# Patient Record
Sex: Male | Born: 1965 | Race: White | Hispanic: No | Smoking: Current every day smoker
Health system: Southern US, Community
[De-identification: ages and names within clinical notes are randomized; demographics above are authoritative.]

## PROBLEM LIST (undated history)

## (undated) DIAGNOSIS — I10 Essential (primary) hypertension: Secondary | ICD-10-CM

## (undated) DIAGNOSIS — F200 Paranoid schizophrenia: Secondary | ICD-10-CM

## (undated) DIAGNOSIS — K7031 Alcoholic cirrhosis of liver with ascites: Secondary | ICD-10-CM

## (undated) HISTORY — DX: Essential (primary) hypertension: I10

## (undated) HISTORY — PX: LACERATION REPAIR: SHX5168

---

## 2018-11-17 DIAGNOSIS — I10 Essential (primary) hypertension: Secondary | ICD-10-CM | POA: Insufficient documentation

## 2018-11-17 DIAGNOSIS — F1020 Alcohol dependence, uncomplicated: Secondary | ICD-10-CM | POA: Insufficient documentation

## 2019-01-17 ENCOUNTER — Inpatient Hospital Stay
Admission: EM | Admit: 2019-01-17 | Discharge: 2019-01-23 | DRG: 432 | Disposition: A | Discharge status: Home Health | Payer: Medicaid Other | Attending: Internal Medicine | Admitting: Internal Medicine

## 2019-01-17 ENCOUNTER — Emergency Department: Payer: Medicaid Other

## 2019-01-17 ENCOUNTER — Other Ambulatory Visit: Payer: Self-pay

## 2019-01-17 ENCOUNTER — Inpatient Hospital Stay: Payer: Medicaid Other | Admitting: Certified Registered"

## 2019-01-17 ENCOUNTER — Encounter
Admission: EM | Disposition: A | Discharge status: Home Health | Payer: Self-pay | Source: Home / Self Care | Attending: Internal Medicine

## 2019-01-17 ENCOUNTER — Inpatient Hospital Stay: Payer: Self-pay

## 2019-01-17 DIAGNOSIS — F10239 Alcohol dependence with withdrawal, unspecified: Secondary | ICD-10-CM | POA: Diagnosis present

## 2019-01-17 DIAGNOSIS — K7040 Alcoholic hepatic failure without coma: Secondary | ICD-10-CM | POA: Diagnosis present

## 2019-01-17 DIAGNOSIS — F17210 Nicotine dependence, cigarettes, uncomplicated: Secondary | ICD-10-CM | POA: Diagnosis present

## 2019-01-17 DIAGNOSIS — E871 Hypo-osmolality and hyponatremia: Secondary | ICD-10-CM | POA: Diagnosis present

## 2019-01-17 DIAGNOSIS — E876 Hypokalemia: Secondary | ICD-10-CM | POA: Diagnosis present

## 2019-01-17 DIAGNOSIS — K210 Gastro-esophageal reflux disease with esophagitis: Secondary | ICD-10-CM | POA: Diagnosis present

## 2019-01-17 DIAGNOSIS — Z515 Encounter for palliative care: Secondary | ICD-10-CM

## 2019-01-17 DIAGNOSIS — R0602 Shortness of breath: Secondary | ICD-10-CM

## 2019-01-17 DIAGNOSIS — R188 Other ascites: Secondary | ICD-10-CM

## 2019-01-17 DIAGNOSIS — K7011 Alcoholic hepatitis with ascites: Secondary | ICD-10-CM | POA: Diagnosis present

## 2019-01-17 DIAGNOSIS — K7031 Alcoholic cirrhosis of liver with ascites: Secondary | ICD-10-CM | POA: Diagnosis present

## 2019-01-17 DIAGNOSIS — K3189 Other diseases of stomach and duodenum: Secondary | ICD-10-CM | POA: Diagnosis present

## 2019-01-17 DIAGNOSIS — D689 Coagulation defect, unspecified: Secondary | ICD-10-CM | POA: Diagnosis present

## 2019-01-17 DIAGNOSIS — K920 Hematemesis: Secondary | ICD-10-CM | POA: Diagnosis present

## 2019-01-17 DIAGNOSIS — I8511 Secondary esophageal varices with bleeding: Secondary | ICD-10-CM | POA: Diagnosis present

## 2019-01-17 DIAGNOSIS — Z7189 Other specified counseling: Secondary | ICD-10-CM

## 2019-01-17 DIAGNOSIS — K766 Portal hypertension: Secondary | ICD-10-CM | POA: Diagnosis present

## 2019-01-17 DIAGNOSIS — Z23 Encounter for immunization: Secondary | ICD-10-CM

## 2019-01-17 HISTORY — PX: ESOPHAGOGASTRODUODENOSCOPY (EGD) WITH PROPOFOL: SHX5813

## 2019-01-17 LAB — URINALYSIS, COMPLETE (UACMP) WITH MICROSCOPIC
Bacteria, UA: NONE SEEN
Glucose, UA: NEGATIVE mg/dL
Ketones, ur: NEGATIVE mg/dL
Leukocytes,Ua: NEGATIVE
Nitrite: NEGATIVE
Protein, ur: NEGATIVE mg/dL
Specific Gravity, Urine: 1.014 (ref 1.005–1.030)
pH: 6 (ref 5.0–8.0)

## 2019-01-17 LAB — HEMOGLOBIN AND HEMATOCRIT, BLOOD
HCT: 38.5 % — ABNORMAL LOW (ref 39.0–52.0)
HCT: 38.2 % — ABNORMAL LOW (ref 39.0–52.0)
HCT: 36.9 % — ABNORMAL LOW (ref 39.0–52.0)
Hemoglobin: 13.9 g/dL (ref 13.0–17.0)
Hemoglobin: 13.4 g/dL (ref 13.0–17.0)
Hemoglobin: 13 g/dL (ref 13.0–17.0)

## 2019-01-17 LAB — COMPREHENSIVE METABOLIC PANEL
ALT: 24 U/L (ref 0–44)
AST: 95 U/L — ABNORMAL HIGH (ref 15–41)
Albumin: 2.6 g/dL — ABNORMAL LOW (ref 3.5–5.0)
Alkaline Phosphatase: 153 U/L — ABNORMAL HIGH (ref 38–126)
Anion gap: 8 (ref 5–15)
BUN: 5 mg/dL — ABNORMAL LOW (ref 6–20)
CO2: 24 mmol/L (ref 22–32)
Calcium: 7.8 mg/dL — ABNORMAL LOW (ref 8.9–10.3)
Chloride: 99 mmol/L (ref 98–111)
Creatinine, Ser: 0.5 mg/dL — ABNORMAL LOW (ref 0.61–1.24)
GFR calc Af Amer: >60 mL/min (ref >60)
GFR calc non Af Amer: >60 mL/min (ref >60)
Glucose, Bld: 114 mg/dL — ABNORMAL HIGH (ref 70–99)
Potassium: 3.1 mmol/L — ABNORMAL LOW (ref 3.5–5.1)
Sodium: 131 mmol/L — ABNORMAL LOW (ref 135–145)
Total Bilirubin: 5.8 mg/dL — ABNORMAL HIGH (ref 0.3–1.2)
Total Protein: 8.1 g/dL (ref 6.5–8.1)

## 2019-01-17 LAB — CBC
HCT: 39.7 % (ref 39.0–52.0)
Hemoglobin: 14.1 g/dL (ref 13.0–17.0)
MCH: 34.9 pg — ABNORMAL HIGH (ref 26.0–34.0)
MCHC: 35.5 g/dL (ref 30.0–36.0)
MCV: 98.3 fL (ref 80.0–100.0)
Platelets: 57 10*3/uL — ABNORMAL LOW (ref 150–400)
RBC: 4.04 MIL/uL — ABNORMAL LOW (ref 4.22–5.81)
RDW: 15.1 % (ref 11.5–15.5)
WBC: 7.9 10*3/uL (ref 4.0–10.5)
nRBC: 0 % (ref 0.0–0.2)

## 2019-01-17 LAB — TYPE AND SCREEN
ABO/RH(D): A POS
Antibody Screen: NEGATIVE

## 2019-01-17 LAB — AMMONIA: Ammonia: 56 umol/L — ABNORMAL HIGH (ref 9–35)

## 2019-01-17 LAB — PROTIME-INR
INR: 1.6 — ABNORMAL HIGH (ref 0.8–1.2)
Prothrombin Time: 18.7 seconds — ABNORMAL HIGH (ref 11.4–15.2)

## 2019-01-17 LAB — LIPASE, BLOOD: Lipase: 57 U/L — ABNORMAL HIGH (ref 11–51)

## 2019-01-17 SURGERY — ESOPHAGOGASTRODUODENOSCOPY (EGD) WITH PROPOFOL
Anesthesia: General

## 2019-01-17 MED ORDER — NICOTINE 14 MG/24HR TD PT24
14.0000 mg | MEDICATED_PATCH | Freq: Every day | TRANSDERMAL | Status: DC
  Administered 2019-01-17 – 2019-01-18 (×2): 14 mg via TRANSDERMAL
  Filled 2019-01-17 (×2): qty 1

## 2019-01-17 MED ORDER — PROPOFOL 10 MG/ML IV BOLUS
INTRAVENOUS | Status: DC | PRN
  Administered 2019-01-17: 70 mg via INTRAVENOUS

## 2019-01-17 MED ORDER — LORAZEPAM 2 MG PO TABS: 0.0000 mg | ORAL_TABLET | Freq: Two times a day (BID) | ORAL | Status: DC

## 2019-01-17 MED ORDER — NADOLOL 20 MG PO TABS
20.0000 mg | ORAL_TABLET | Freq: Every day | ORAL | Status: DC
  Administered 2019-01-17 – 2019-01-23 (×6): 20 mg via ORAL
  Filled 2019-01-17 (×7): qty 1

## 2019-01-17 MED ORDER — SODIUM CHLORIDE 0.9 % IV SOLN
50.0000 ug/h | INTRAVENOUS | Status: DC
  Administered 2019-01-17 – 2019-01-21 (×8): 50 ug/h via INTRAVENOUS
  Filled 2019-01-17 (×13): qty 1

## 2019-01-17 MED ORDER — ONDANSETRON HCL 4 MG PO TABS: 4.0000 mg | ORAL_TABLET | Freq: Four times a day (QID) | ORAL | Status: DC | PRN

## 2019-01-17 MED ORDER — SODIUM CHLORIDE 0.9 % IV SOLN
1.0000 g | INTRAVENOUS | Status: DC
  Administered 2019-01-18: 1 g via INTRAVENOUS
  Filled 2019-01-17: qty 10
  Filled 2019-01-17: qty 1

## 2019-01-17 MED ORDER — LIDOCAINE 2% (20 MG/ML) 5 ML SYRINGE
INTRAMUSCULAR | Status: DC | PRN
  Administered 2019-01-17: 25 mg via INTRAVENOUS

## 2019-01-17 MED ORDER — PROPOFOL 500 MG/50ML IV EMUL
INTRAVENOUS | Status: DC | PRN
  Administered 2019-01-17: 100 ug/kg/min via INTRAVENOUS

## 2019-01-17 MED ORDER — LORAZEPAM 2 MG/ML IJ SOLN: 0.0000 mg | Freq: Two times a day (BID) | INTRAMUSCULAR | Status: DC

## 2019-01-17 MED ORDER — THIAMINE HCL 100 MG/ML IJ SOLN
100.0000 mg | Freq: Every day | INTRAMUSCULAR | Status: DC
  Administered 2019-01-17: 100 mg via INTRAVENOUS
  Filled 2019-01-17: qty 2

## 2019-01-17 MED ORDER — ACETAMINOPHEN 650 MG RE SUPP: 650.0000 mg | Freq: Four times a day (QID) | RECTAL | Status: DC | PRN

## 2019-01-17 MED ORDER — LORAZEPAM 2 MG PO TABS
0.0000 mg | ORAL_TABLET | Freq: Four times a day (QID) | ORAL | Status: DC
  Administered 2019-01-18: 2 mg via ORAL
  Administered 2019-01-18: 1 mg via ORAL
  Filled 2019-01-17 (×2): qty 1

## 2019-01-17 MED ORDER — ONDANSETRON HCL 4 MG/2ML IJ SOLN
4.0000 mg | Freq: Once | INTRAMUSCULAR | Status: AC
  Administered 2019-01-17: 07:00:00 4 mg via INTRAVENOUS
  Filled 2019-01-17: qty 2

## 2019-01-17 MED ORDER — SODIUM CHLORIDE 0.9 % IV SOLN
1.0000 g | Freq: Once | INTRAVENOUS | Status: AC
  Administered 2019-01-17: 07:00:00 1 g via INTRAVENOUS
  Filled 2019-01-17: qty 10

## 2019-01-17 MED ORDER — LORAZEPAM 2 MG/ML IJ SOLN
0.0000 mg | Freq: Four times a day (QID) | INTRAMUSCULAR | Status: DC
  Administered 2019-01-17: 16:00:00 2 mg via INTRAVENOUS
  Filled 2019-01-17: qty 1

## 2019-01-17 MED ORDER — THIAMINE HCL 100 MG/ML IJ SOLN
Freq: Once | INTRAVENOUS | Status: AC
  Administered 2019-01-17: 23:00:00 via INTRAVENOUS
  Filled 2019-01-17: qty 1000

## 2019-01-17 MED ORDER — VITAMIN B-1 100 MG PO TABS
100.0000 mg | ORAL_TABLET | Freq: Every day | ORAL | Status: DC
  Administered 2019-01-18: 100 mg via ORAL
  Filled 2019-01-17: qty 1

## 2019-01-17 MED ORDER — SODIUM CHLORIDE 0.9 % IV SOLN
INTRAVENOUS | Status: DC | PRN
  Administered 2019-01-17: 17:00:00 via INTRAVENOUS

## 2019-01-17 MED ORDER — SODIUM CHLORIDE 0.9 % IV SOLN
80.0000 mg | Freq: Once | INTRAVENOUS | Status: AC
  Administered 2019-01-17: 80 mg via INTRAVENOUS
  Filled 2019-01-17: qty 40

## 2019-01-17 MED ORDER — DIPHENHYDRAMINE HCL 50 MG/ML IJ SOLN
12.5000 mg | Freq: Once | INTRAMUSCULAR | Status: AC
  Administered 2019-01-17: 10:00:00 12.5 mg via INTRAVENOUS
  Filled 2019-01-17: qty 1

## 2019-01-17 MED ORDER — ACETAMINOPHEN 325 MG PO TABS: 650.0000 mg | ORAL_TABLET | Freq: Four times a day (QID) | ORAL | Status: DC | PRN

## 2019-01-17 MED ORDER — SODIUM CHLORIDE 0.9 % IV SOLN
500.0000 mg | INTRAVENOUS | Status: DC
  Administered 2019-01-17 – 2019-01-18 (×2): 500 mg via INTRAVENOUS
  Filled 2019-01-17 (×3): qty 500

## 2019-01-17 MED ORDER — SODIUM CHLORIDE 0.9 % IV SOLN
8.0000 mg/h | INTRAVENOUS | Status: AC
  Administered 2019-01-17 – 2019-01-20 (×7): 8 mg/h via INTRAVENOUS
  Filled 2019-01-17 (×7): qty 80

## 2019-01-17 MED ORDER — OCTREOTIDE LOAD VIA INFUSION
50.0000 ug | Freq: Once | INTRAVENOUS | Status: AC
  Administered 2019-01-17: 50 ug via INTRAVENOUS
  Filled 2019-01-17: qty 25

## 2019-01-17 MED ORDER — SODIUM CHLORIDE 0.9 % IV SOLN: 1.0000 g | INTRAVENOUS | Status: DC

## 2019-01-17 MED ORDER — PANTOPRAZOLE SODIUM 40 MG IV SOLR: 40.0000 mg | Freq: Two times a day (BID) | INTRAVENOUS | Status: DC

## 2019-01-17 MED ORDER — PNEUMOCOCCAL VAC POLYVALENT 25 MCG/0.5ML IJ INJ
0.5000 mL | INJECTION | INTRAMUSCULAR | Status: AC
  Administered 2019-01-18: 0.5 mL via INTRAMUSCULAR
  Filled 2019-01-17 (×2): qty 0.5

## 2019-01-17 MED ORDER — ONDANSETRON HCL 4 MG/2ML IJ SOLN
4.0000 mg | Freq: Four times a day (QID) | INTRAMUSCULAR | Status: DC | PRN
  Administered 2019-01-17: 10:00:00 4 mg via INTRAVENOUS
  Filled 2019-01-17: qty 2

## 2019-01-17 MED ORDER — POTASSIUM CHLORIDE IN NACL 20-0.9 MEQ/L-% IV SOLN
INTRAVENOUS | Status: DC
  Administered 2019-01-17 – 2019-01-18 (×3): via INTRAVENOUS
  Filled 2019-01-17 (×3): qty 1000

## 2019-01-17 NOTE — H&P (View-Only) (Signed)
[facility] GI Inpatient Consult Note   Jamey Reas, M.D.  Reason for Consult:  Hemetemesis, hx alcoholic cirrhosis.   Attending Requesting Consult:  Ramonita Lab, M.D.  History of Present Illness: Frederick Cabrera is a 53 y.o. male with a history of alcoholism, cirrhosis secondary to the former and ascites.  Documentation points to previous hospitalization at [facility] in [place] city in February 2020.  Apparently the patient had massive ascites requiring over 10 L paracentesis and was also admitted with anasarca.  He was discharged and continued symptomatic but stable condition around February 24. Abdominal ultrasound in Feb 2020 showed coarsened hepatic echotexture highly suggestive of cirrhosis along with massive ascites. Gallbladder and biliary tree grossly normal at that time. Doppler flow study showed hepatofugal (retrograde) portal flow without evidence of portal vein thrombosis. Chest xray on this admission (01/17/2019) suggests bilateral pleural effusions vs. Infiltrates.   The patient presented the emergency room early this morning with complaints of abdominal distention, discomfort and one episode of hematemesis.  This bleeding appeared to be hemodynamically insignificant but abdominal pain continues.  He complains of significant distention of the abdomen.  No further episodes of vomiting.  No melena.  Patient denies any NSAID use.  He drinks about a 12 pack of beer per day and some occasional vodka that his brother supplies him.  Until recently, the patient was homeless.  Patient denies that he has ever had an upper endoscopy performed.  Past Medical History:  History reviewed. No pertinent past medical history.  Problem List: Patient Active Problem List   Diagnosis Date Noted  . Hematemesis 01/17/2019    Past Surgical History: History reviewed. No pertinent surgical history.  Allergies: No Known Allergies  Home Medications: No medications prior to admission.    Home medication reconciliation was completed with the patient.   Scheduled Inpatient Medications:   . LORazepam  0-4 mg Intravenous Q6H   Or  . LORazepam  0-4 mg Oral Q6H  . [START ON 01/19/2019] LORazepam  0-4 mg Intravenous Q12H   Or  . [START ON 01/19/2019] LORazepam  0-4 mg Oral Q12H  . [START ON 01/20/2019] pantoprazole  40 mg Intravenous Q12H  . [START ON 01/18/2019] pneumococcal 23 valent vaccine  0.5 mL Intramuscular Tomorrow-1000  . thiamine  100 mg Oral Daily   Or  . thiamine  100 mg Intravenous Daily    Continuous Inpatient Infusions:   . 0.9 % NaCl with KCl 20 mEq / L 100 mL/hr at 01/17/19 1021  . azithromycin    . [START ON 01/18/2019] cefTRIAXone (ROCEPHIN)  IV    . octreotide  (SANDOSTATIN)    IV infusion 50 mcg/hr (01/17/19 0802)  . pantoprozole (PROTONIX) infusion 8 mg/hr (01/17/19 0823)  . banana bag IV 1000 mL      PRN Inpatient Medications:  acetaminophen **OR** acetaminophen, ondansetron **OR** ondansetron (ZOFRAN) IV  Family History: family history is not on file.   GI Family History: Negative  Social History:   reports that he has been smoking. He has never used smokeless tobacco. He reports current alcohol use. He reports previous drug use. The patient denies ETOH, tobacco, or drug use.    Review of Systems: Review of Systems - Negative except HPI  Physical Examination: BP (!) 144/91   Pulse 88   Temp 98.4 F (36.9 C)   Resp 17   Ht 5\' 5"  (1.651 m)   Wt 90.7 kg   SpO2 96%   BMI 33.28 kg/m  Physical Exam Vitals signs and nursing note reviewed.  Constitutional:      General: He is not in acute distress.    Appearance: He is well-developed. He is obese. He is ill-appearing. He is not toxic-appearing or diaphoretic.  HENT:     Head: Normocephalic and atraumatic.  Eyes:     Extraocular Movements: Extraocular movements intact.  Abdominal:     General: Bowel sounds are normal. There is distension.     Palpations: Abdomen is soft. There is  fluid wave. There is no mass.     Tenderness: There is generalized abdominal tenderness. There is no guarding or rebound.     Hernia: No hernia is present.     Comments: Significant amount of ascites present  Skin:    General: Skin is warm and dry.     Coloration: Skin is not cyanotic or jaundiced.  Neurological:     General: No focal deficit present.     Mental Status: He is alert.  Psychiatric:        Mood and Affect: Mood normal.     Data: Lab Results  Component Value Date   WBC 7.9 01/17/2019   HGB 13.9 01/17/2019   HCT 38.5 (L) 01/17/2019   MCV 98.3 01/17/2019   PLT 57 (L) 01/17/2019   Recent Labs  Lab 01/17/19 0628 01/17/19 1021  HGB 14.1 13.9   Lab Results  Component Value Date   NA 131 (L) 01/17/2019   K 3.1 (L) 01/17/2019   CL 99 01/17/2019   CO2 24 01/17/2019   BUN 5 (L) 01/17/2019   CREATININE 0.50 (L) 01/17/2019   Lab Results  Component Value Date   ALT 24 01/17/2019   AST 95 (H) 01/17/2019   ALKPHOS 153 (H) 01/17/2019   BILITOT 5.8 (H) 01/17/2019   Recent Labs  Lab 01/17/19 0628  INR 1.6*   CBC Latest Ref Rng & Units 01/17/2019 01/17/2019  WBC 4.0 - 10.5 K/uL - 7.9  Hemoglobin 13.0 - 17.0 g/dL 16.113.9 09.614.1  Hematocrit 04.539.0 - 52.0 % 38.5(L) 39.7  Platelets 150 - 400 K/uL - 57(L)    STUDIES: Dg Chest Portable 1 View  Result Date: 01/17/2019 CLINICAL DATA:  Shortness of breath onset last night. Smoker. PORTABLE CHEST 1 VIEW COMPARISON:  None. FINDINGS: Cardiomegaly. No consolidation or effusion. Mild prominence of the interstitium. BILATERAL pulmonary opacities could represent edema or infection. No pneumothorax. Bones unremarkable. IMPRESSION: Cardiomegaly. BILATERAL pulmonary opacities could represent edema or infection. Electronically Signed   By: Elsie StainJohn T Curnes M.D.   On: 01/17/2019 07:08   [place] Ekg Site Rite  Result Date: 01/17/2019 If Site Rite image not attached, placement could not be confirmed due to current cardiac  rhythm.  @IMAGES @  Assessment: 1. UGI bleed - Stable on IV octreotide. 2. 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 3. MELD score of 14.  4. Alcoholic hepatitis Maddrey score of 24, does not indicate need for pentoxifylline or methylprednisolone at this time. 5. Alcoholism. 6. Ascites - requires paracentesis when feasible.  Recommendations: 1. Agree with NPO. 2. Agree with IV octreotide, antibiotics to prevent SBP in the setting of GI bleed. 3. IV acid suppression. 4. EGD today. The patient understands the nature of the planned procedure, indications, risks, alternatives and potential complications including but not limited to bleeding, infection, perforation, damage to internal organs and possible oversedation/side effects from anesthesia. The patient agrees and gives consent to proceed.  Please refer to procedure notes for findings, recommendations and patient  disposition/instructions.  Thank you for the consult. Please call with questions or concerns.  [place], Teodoro, "Keith" MD [facility] [address] [place], [place] [address] [phone]  01/17/2019 2:42 PM     

## 2019-01-17 NOTE — ED Provider Notes (Signed)
[facility] Emergency Department Provider Note  ____________________________________________  Time seen: Approximately 8:06 AM  I have reviewed the triage vital signs and the nursing notes.   HISTORY  Chief Complaint Abdominal Pain and Hematemesis   HPI Frederick Cabrera is a 53 y.o. male with h/o alcoholic cirrhosis who presents for evaluation of hematemesis. Patient reports one episode of hematemesis this morning. Reports about half a cup of blood seen, no coffee-ground emesis.  No melena, stool has been tan colored.  Patient recently moved here from [place] 6 months ago.  Does not have a doctor here.  Patient was homeless in [place].  According to the patient he was never told he had cirrhosis of the liver however has had paracentesis before.  He does not remember having an endoscopy or any history of variceal bleed.  He denies any prior history of hematemesis.  He continues to drink.  Patient denies abdominal pain  PMH Alcoholic cirrhosis Alcohol abuse  Allergies Patient has no known allergies.  No family history on file.  Social History Alcohol - yes, 12 pack per day Smoking - yes Drugs - no  Review of Systems  Constitutional: Negative for fever. Eyes: Negative for visual changes. ENT: Negative for sore throat. Neck: No neck pain  Cardiovascular: Negative for chest pain. Respiratory: Negative for shortness of breath. Gastrointestinal: Negative for abdominal pain. + hematemesis. Genitourinary: Negative for dysuria. Musculoskeletal: Negative for back pain. Skin: Negative for rash. Neurological: Negative for headaches, weakness or numbness. Psych: No SI or HI  ____________________________________________   PHYSICAL EXAM:  VITAL SIGNS: ED Triage Vitals  Enc Vitals Group     BP 01/17/19 0616 (!) 170/99     Pulse Rate 01/17/19 0616 97     Resp 01/17/19 0616 20     Temp 01/17/19 0616 98.4 F (36.9 C)     Temp src --    SpO2 01/17/19 0616 94 %     Weight 01/17/19 0615 200 lb (90.7 kg)     Height 01/17/19 0615 5\' 5"  (1.651 m)     Head Circumference --      Peak Flow --      Pain Score 01/17/19 0615 10     Pain Loc --      Pain Edu? --      Excl. in GC? --     Constitutional: Alert and oriented. Well appearing and in no apparent distress. HEENT:      Head: Normocephalic and atraumatic.         Eyes: Conjunctivae are normal. Sclera is icteric.       Mouth/Throat: Mucous membranes are moist.       Neck: Supple with no signs of meningismus. Cardiovascular: Regular rate and rhythm. No murmurs, gallops, or rubs. 2+ symmetrical distal pulses are present in all extremities. No JVD. Respiratory: Normal respiratory effort. Lungs are clear to auscultation bilaterally. No wheezes, crackles, or rhonchi.  Gastrointestinal: Soft, distended, reducible umbilical hernia, non tender with positive bowel sounds. No rebound or guarding. Musculoskeletal: Nontender with normal range of motion in all extremities. No edema, cyanosis, or erythema of extremities. Neurologic: Normal speech and language. Face is symmetric. Moving all extremities. No gross focal neurologic deficits are appreciated. Skin: Skin is warm, dry and intact. No rash noted. Skin is jaundiced Psychiatric: Mood and affect are normal. Speech and behavior are normal.  ____________________________________________   LABS (all labs ordered are listed, but only abnormal results are displayed)  Labs Reviewed  COMPREHENSIVE METABOLIC  PANEL - Abnormal; Notable for the following components:      Result Value   Sodium 131 (*)    Potassium 3.1 (*)    Glucose, Bld 114 (*)    BUN 5 (*)    Creatinine, Ser 0.50 (*)    Calcium 7.8 (*)    Albumin 2.6 (*)    AST 95 (*)    Alkaline Phosphatase 153 (*)    Total Bilirubin 5.8 (*)    All other components within normal limits  CBC - Abnormal; Notable for the following components:   RBC 4.04 (*)    MCH 34.9 (*)     Platelets 57 (*)    All other components within normal limits  URINALYSIS, COMPLETE (UACMP) WITH MICROSCOPIC - Abnormal; Notable for the following components:   Color, Urine AMBER (*)    APPearance CLEAR (*)    Hgb urine dipstick SMALL (*)    Bilirubin Urine SMALL (*)    All other components within normal limits  AMMONIA - Abnormal; Notable for the following components:   Ammonia 56 (*)    All other components within normal limits  LIPASE, BLOOD - Abnormal; Notable for the following components:   Lipase 57 (*)    All other components within normal limits  PROTIME-INR - Abnormal; Notable for the following components:   Prothrombin Time 18.7 (*)    INR 1.6 (*)    All other components within normal limits  TYPE AND SCREEN   ____________________________________________  EKG  ED ECG REPORT I, Nita Sicklearolina Peola Joynt, the attending physician, personally viewed and interpreted this ECG.  Normal sinus rhythm, rate of 89, normal intervals, LAFB, anterior and inferior Q waves, no ST elevations or depressions.  No prior for comparison. ____________________________________________  RADIOLOGY  I have personally reviewed the images performed during this visit and I agree with the Radiologist's read.   Interpretation by Radiologist:  Dg Chest Portable 1 View  Result Date: 01/17/2019 CLINICAL DATA:  Shortness of breath onset last night. Smoker. PORTABLE CHEST 1 VIEW COMPARISON:  None. FINDINGS: Cardiomegaly. No consolidation or effusion. Mild prominence of the interstitium. BILATERAL pulmonary opacities could represent edema or infection. No pneumothorax. Bones unremarkable. IMPRESSION: Cardiomegaly. BILATERAL pulmonary opacities could represent edema or infection. Electronically Signed   By: Elsie StainJohn T Curnes M.D.   On: 01/17/2019 07:08     ____________________________________________   PROCEDURES  Procedure(s) performed: None Procedures Critical Care performed: yes  CRITICAL CARE Performed  by: Nita Sicklearolina Aretha Levi  ?  Total critical care time: 30 min  Critical care time was exclusive of separately billable procedures and treating other patients.  Critical care was necessary to treat or prevent imminent or life-threatening deterioration.  Critical care was time spent personally by me on the following activities: development of treatment plan with patient and/or surrogate as well as nursing, discussions with consultants, evaluation of patient's response to treatment, examination of patient, obtaining history from patient or surrogate, ordering and performing treatments and interventions, ordering and review of laboratory studies, ordering and review of radiographic studies, pulse oximetry and re-evaluation of patient's condition.  ____________________________________________   INITIAL IMPRESSION / ASSESSMENT AND PLAN / ED COURSE  53 y.o. male with h/o alcoholic cirrhosis who presents for evaluation of hematemesis.  Patient is hemodynamically stable with normal hemoglobin.  No melena.  Due to known history of varices patient was started on octreotide, Protonix, Rocephin.  Type and cross active.  Discussed with Dr. Elisabeth PigeonVachhani for admission.      As part of  my medical decision making, I reviewed the following data within the electronic MEDICAL RECORD NUMBER Nursing notes reviewed and incorporated, Labs reviewed , EKG interpreted , Radiograph reviewed , Discussed with admitting physician , Notes from prior ED visits and [place] Controlled Substance Database    Pertinent labs & imaging results that were available during my care of the patient were reviewed by me and considered in my medical decision making (see chart for details).    ____________________________________________   FINAL CLINICAL IMPRESSION(S) / ED DIAGNOSES  Final diagnoses:  Hematemesis with nausea  Alcoholic cirrhosis of liver with ascites (HCC)      NEW MEDICATIONS STARTED DURING THIS VISIT:  ED Discharge  Orders    None       Note:  This document was prepared using Dragon voice recognition software and may include unintentional dictation errors.    Don Perking, [place], MD 01/17/19 [phone]
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
## 2019-01-17 NOTE — Progress Notes (Signed)
Spoke with Meghan RN re PICC order.  Notified plan to place PICC this shift.Will notify PTA.

## 2019-01-17 NOTE — Interval H&P Note (Signed)
History and Physical Interval Note:  01/17/2019 5:47 PM  Frederick Cabrera  has presented today for surgery, with the diagnosis of Upper GI bleed, Cirrhosis.  The various methods of treatment have been discussed with the patient and family. After consideration of risks, benefits and other options for treatment, the patient has consented to  Procedure(s): ESOPHAGOGASTRODUODENOSCOPY (EGD) WITH PROPOFOL (N/A) as a surgical intervention.  The patient's history has been reviewed, patient examined, no change in status, stable for surgery.  I have reviewed the patient's chart and labs.  Questions were answered to the patient's satisfaction.     [place], [place]

## 2019-01-17 NOTE — Anesthesia Preprocedure Evaluation (Signed)
Anesthesia Evaluation  Patient identified by MRN, date of birth, ID band Patient awake    Reviewed: Allergy & Precautions, H&P , NPO status , Patient's Chart, lab work & pertinent test results, reviewed documented beta blocker date and time   History of Anesthesia Complications Negative for: history of anesthetic complications  Airway Mallampati: II  TM Distance: >3 FB Neck ROM: full    Dental  (+) Dental Advidsory Given, Poor Dentition, Chipped, Missing   Pulmonary neg shortness of breath, neg COPD, neg recent URI, Current Smoker,           Cardiovascular Exercise Tolerance: Good hypertension, (-) angina(-) CAD, (-) Past MI, (-) Cardiac Stents and (-) CABG (-) dysrhythmias (-) Valvular Problems/Murmurs     Neuro/Psych PSYCHIATRIC DISORDERS (Altered Mental Status) negative neurological ROS     GI/Hepatic GERD  ,(+) Cirrhosis       ,   Endo/Other  negative endocrine ROS  Renal/GU negative Renal ROS  negative genitourinary   Musculoskeletal   Abdominal   Peds  Hematology  (+) Blood dyscrasia, anemia ,   Anesthesia Other Findings History reviewed. No pertinent past medical history.  Patient with AMS, so talked to his brother Windy Fast) to obtain consent for anesthesia.  Reproductive/Obstetrics negative OB ROS                            Anesthesia Physical Anesthesia Plan  ASA: IV  Anesthesia Plan: General   Post-op Pain Management:    Induction:   PONV Risk Score and Plan: 1 and Propofol infusion and TIVA  Airway Management Planned:   Additional Equipment:   Intra-op Plan:   Post-operative Plan:   Informed Consent: I have reviewed the patients History and Physical, chart, labs and discussed the procedure including the risks, benefits and alternatives for the proposed anesthesia with the patient or authorized representative who has indicated his/her understanding and acceptance.      Dental Advisory Given  Plan Discussed with: Anesthesiologist, CRNA and Surgeon  Anesthesia Plan Comments:        Anesthesia Quick Evaluation

## 2019-01-17 NOTE — ED Triage Notes (Signed)
Pt arrives with swollen abdomen, shortness of breath, yellow sclera, and possible hematemesis. Pt states he is not sure if he has cirrhosis.

## 2019-01-17 NOTE — Anesthesia Postprocedure Evaluation (Signed)
Anesthesia Post Note  Patient: Frederick Cabrera  Procedure(s) Performed: ESOPHAGOGASTRODUODENOSCOPY (EGD) WITH PROPOFOL (N/A )  Patient location during evaluation: PACU Anesthesia Type: General Level of consciousness: awake and alert Pain management: pain level controlled Vital Signs Assessment: post-procedure vital signs reviewed and stable Respiratory status: spontaneous breathing, nonlabored ventilation, respiratory function stable and patient connected to nasal cannula oxygen Cardiovascular status: blood pressure returned to baseline and stable Postop Assessment: no apparent nausea or vomiting Anesthetic complications: no     Last Vitals:  Vitals:   01/17/19 1727 01/17/19 1738  BP: 114/73 (!) 131/59  Pulse: 96 95  Resp: 20 (!) 22  Temp: 36.4 C   SpO2: 98% 96%    Last Pain:  Vitals:   01/17/19 1738  TempSrc:   PainSc: Asleep                 Lenard Simmer

## 2019-01-17 NOTE — Progress Notes (Signed)
Report given to [facility].   Lighter and cigarettes in bed with patient , placed in bag and in the chart.  Psychologist, prison and probation services.

## 2019-01-17 NOTE — ED Notes (Signed)
Patient being transferred to room 115 by this tech.

## 2019-01-17 NOTE — Consult Note (Signed)
[facility] GI Inpatient Consult Note   Jamey Reas, M.D.  Reason for Consult:  Hemetemesis, hx alcoholic cirrhosis.   Attending Requesting Consult:  Ramonita Lab, M.D.  History of Present Illness: Frederick Cabrera is a 53 y.o. male with a history of alcoholism, cirrhosis secondary to the former and ascites.  Documentation points to previous hospitalization at [facility] in [place] in February 2020.  Apparently the patient had massive ascites requiring over 10 L paracentesis and was also admitted with anasarca.  He was discharged and continued symptomatic but stable condition around February 24. Abdominal ultrasound in Feb 2020 showed coarsened hepatic echotexture highly suggestive of cirrhosis along with massive ascites. Gallbladder and biliary tree grossly normal at that time. Doppler flow study showed hepatofugal (retrograde) portal flow without evidence of portal vein thrombosis. Chest xray on this admission (01/17/2019) suggests bilateral pleural effusions vs. Infiltrates.   The patient presented the emergency room early this morning with complaints of abdominal distention, discomfort and one episode of hematemesis.  This bleeding appeared to be hemodynamically insignificant but abdominal pain continues.  He complains of significant distention of the abdomen.  No further episodes of vomiting.  No melena.  Patient denies any NSAID use.  He drinks about a 12 pack of beer per day and some occasional vodka that his brother supplies him.  Until recently, the patient was homeless.  Patient denies that he has ever had an upper endoscopy performed.  Past Medical History:  History reviewed. No pertinent past medical history.  Problem List: Patient Active Problem List   Diagnosis Date Noted  . Hematemesis 01/17/2019    Past Surgical History: History reviewed. No pertinent surgical history.  Allergies: No Known Allergies  Home Medications: No medications prior to admission.    Home medication reconciliation was completed with the patient.   Scheduled Inpatient Medications:   . LORazepam  0-4 mg Intravenous Q6H   Or  . LORazepam  0-4 mg Oral Q6H  . [START ON 01/19/2019] LORazepam  0-4 mg Intravenous Q12H   Or  . [START ON 01/19/2019] LORazepam  0-4 mg Oral Q12H  . [START ON 01/20/2019] pantoprazole  40 mg Intravenous Q12H  . [START ON 01/18/2019] pneumococcal 23 valent vaccine  0.5 mL Intramuscular Tomorrow-1000  . thiamine  100 mg Oral Daily   Or  . thiamine  100 mg Intravenous Daily    Continuous Inpatient Infusions:   . 0.9 % NaCl with KCl 20 mEq / L 100 mL/hr at 01/17/19 1021  . azithromycin    . [START ON 01/18/2019] cefTRIAXone (ROCEPHIN)  IV    . octreotide  (SANDOSTATIN)    IV infusion 50 mcg/hr (01/17/19 0802)  . pantoprozole (PROTONIX) infusion 8 mg/hr (01/17/19 0823)  . banana bag IV 1000 mL      PRN Inpatient Medications:  acetaminophen **OR** acetaminophen, ondansetron **OR** ondansetron (ZOFRAN) IV  Family History: family history is not on file.   GI Family History: Negative  Social History:   reports that he has been smoking. He has never used smokeless tobacco. He reports current alcohol use. He reports previous drug use. The patient denies ETOH, tobacco, or drug use.    Review of Systems: Review of Systems - Negative except HPI  Physical Examination: BP (!) 144/91   Pulse 88   Temp 98.4 F (36.9 C)   Resp 17   Ht 5\' 5"  (1.651 m)   Wt 90.7 kg   SpO2 96%   BMI 33.28 kg/m  Physical Exam Vitals signs and nursing note reviewed.  Constitutional:      General: He is not in acute distress.    Appearance: He is well-developed. He is obese. He is ill-appearing. He is not toxic-appearing or diaphoretic.  HENT:     Head: Normocephalic and atraumatic.  Eyes:     Extraocular Movements: Extraocular movements intact.  Abdominal:     General: Bowel sounds are normal. There is distension.     Palpations: Abdomen is soft. There is  fluid wave. There is no mass.     Tenderness: There is generalized abdominal tenderness. There is no guarding or rebound.     Hernia: No hernia is present.     Comments: Significant amount of ascites present  Skin:    General: Skin is warm and dry.     Coloration: Skin is not cyanotic or jaundiced.  Neurological:     General: No focal deficit present.     Mental Status: He is alert.  Psychiatric:        Mood and Affect: Mood normal.     Data: Lab Results  Component Value Date   WBC 7.9 01/17/2019   HGB 13.9 01/17/2019   HCT 38.5 (L) 01/17/2019   MCV 98.3 01/17/2019   PLT 57 (L) 01/17/2019   Recent Labs  Lab 01/17/19 0628 01/17/19 1021  HGB 14.1 13.9   Lab Results  Component Value Date   NA 131 (L) 01/17/2019   K 3.1 (L) 01/17/2019   CL 99 01/17/2019   CO2 24 01/17/2019   BUN 5 (L) 01/17/2019   CREATININE 0.50 (L) 01/17/2019   Lab Results  Component Value Date   ALT 24 01/17/2019   AST 95 (H) 01/17/2019   ALKPHOS 153 (H) 01/17/2019   BILITOT 5.8 (H) 01/17/2019   Recent Labs  Lab 01/17/19 0628  INR 1.6*   CBC Latest Ref Rng & Units 01/17/2019 01/17/2019  WBC 4.0 - 10.5 K/uL - 7.9  Hemoglobin 13.0 - 17.0 g/dL 16.113.9 09.614.1  Hematocrit 04.539.0 - 52.0 % 38.5(L) 39.7  Platelets 150 - 400 K/uL - 57(L)    STUDIES: Dg Chest Portable 1 View  Result Date: 01/17/2019 CLINICAL DATA:  Shortness of breath onset last night. Smoker. PORTABLE CHEST 1 VIEW COMPARISON:  None. FINDINGS: Cardiomegaly. No consolidation or effusion. Mild prominence of the interstitium. BILATERAL pulmonary opacities could represent edema or infection. No pneumothorax. Bones unremarkable. IMPRESSION: Cardiomegaly. BILATERAL pulmonary opacities could represent edema or infection. Electronically Signed   By: Elsie StainJohn T Curnes M.D.   On: 01/17/2019 07:08   [place] Ekg Site Rite  Result Date: 01/17/2019 If Site Rite image not attached, placement could not be confirmed due to current cardiac  rhythm.  @IMAGES @  Assessment: 1. UGI bleed - Stable on IV octreotide. 2. 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 3. MELD score of 14.  4. Alcoholic hepatitis Maddrey score of 24, does not indicate need for pentoxifylline or methylprednisolone at this time. 5. Alcoholism. 6. Ascites - requires paracentesis when feasible.  Recommendations: 1. Agree with NPO. 2. Agree with IV octreotide, antibiotics to prevent SBP in the setting of GI bleed. 3. IV acid suppression. 4. EGD today. The patient understands the nature of the planned procedure, indications, risks, alternatives and potential complications including but not limited to bleeding, infection, perforation, damage to internal organs and possible oversedation/side effects from anesthesia. The patient agrees and gives consent to proceed.  Please refer to procedure notes for findings, recommendations and patient  disposition/instructions.  Thank you for the consult. Please call with questions or concerns.  Rosina Lowenstein, "Mellody Dance MD [facility] [address] [place], [place] [address] [phone]  01/17/2019 2:42 PM

## 2019-01-17 NOTE — Transfer of Care (Signed)
Immediate Anesthesia Transfer of Care Note  Patient: Frederick Cabrera  Procedure(s) Performed: ESOPHAGOGASTRODUODENOSCOPY (EGD) WITH PROPOFOL (N/A )  Patient Location: PACU  Anesthesia Type:General  Level of Consciousness: sedated  Airway & Oxygen Therapy: Patient Spontanous Breathing and Patient connected to nasal cannula oxygen  Post-op Assessment: Report given to RN and Post -op Vital signs reviewed and stable  Post vital signs: Reviewed  Last Vitals:  Vitals Value Taken Time  BP 114/73 01/17/2019  5:27 PM  Temp    Pulse 96 01/17/2019  5:27 PM  Resp 20 01/17/2019  5:27 PM  SpO2 98 % 01/17/2019  5:27 PM    Last Pain:  Vitals:   01/17/19 1710  TempSrc: Tympanic  PainSc: 0-No pain         Complications: No apparent anesthesia complications

## 2019-01-17 NOTE — Anesthesia Post-op Follow-up Note (Signed)
Anesthesia QCDR form completed.        

## 2019-01-17 NOTE — Progress Notes (Signed)
On unit to place PICC line. RN states pt getting EGD. Will reassess for PICC placement 01/18/19 due to sedation required for EGD.

## 2019-01-17 NOTE — Op Note (Signed)
[facility]enter[facility] Patient Name: Frederick CalamityRichard Volden Procedure Date: 01/17/2019 5:04 PM MRN: [id number] Account #: [id number] Date of Birth: 07/25/1966 Admit Type: Inpatient Age: [id number] Room: [facility] ENDO ROOM 4 Gender: Male Note Status: Finalized Procedure:            Upper GI endoscopy Indications:          Hematemesis Providers:            Boykin Nearingeodoro K. Norma Fredricksonoledo MD, MD Referring MD:         No Local Md, MD (Referring MD) Medicines:            Propofol per Anesthesia Complications:        No immediate complications. Procedure:            Pre-Anesthesia Assessment:                       - The risks and benefits of the procedure and the                        sedation options and risks were discussed with the                        patient. All questions were answered and informed                        consent was obtained.                       - Patient identification and proposed procedure were                        verified prior to the procedure by the nurse. The                        procedure was verified in the procedure room.                       - ASA Grade Assessment: III - A patient with severe                        systemic disease.                       - After reviewing the risks and benefits, the patient                        was deemed in satisfactory condition to undergo the                        procedure.                       After obtaining informed consent, the endoscope was                        passed under direct vision. Throughout the procedure,                        the patient's blood pressure, pulse, and oxygen  saturations were monitored continuously. The Endoscope                        was introduced through the mouth, and advanced to the                        third part of duodenum. The upper GI endoscopy was                        accomplished without difficulty. The patient tolerated       the procedure well. Findings:      Grade II varices were found in the middle third of the esophagus and in       the lower third of the esophagus. They were 8 mm in largest diameter. No       stigmata of bleeding noted such as red wale or cherry red spots.      Severe portal hypertensive gastropathy was found in the entire examined       stomach.      The examined duodenum was normal.      LA Grade A (one or more mucosal breaks less than 5 mm, not extending       between tops of 2 mucosal folds) esophagitis with no bleeding was found       in the distal esophagus.      The exam was otherwise without abnormality. Impression:           - Grade II esophageal varices.                       - Portal hypertensive gastropathy.                       - Normal examined duodenum.                       - LA Grade A reflux esophagitis.                       - The examination was otherwise normal.                       - No specimens collected. Recommendation:       - Return patient to hospital ward for ongoing care.                       - Low sodium diet.                       - Continue present medications.                       - Paracentesis needed. Procedure Code(s):    --- Professional ---                       [phone], Esophagogastroduodenoscopy, flexible, transoral;                        diagnostic, including collection of specimen(s) by                        brushing or washing, when performed (separate procedure) Diagnosis Code(s):    --- Professional ---  K92.0, Hematemesis                       K21.0, Gastro-esophageal reflux disease with esophagitis                       K31.89, Other diseases of stomach and duodenum                       K76.6, Portal hypertension                       I85.00, Esophageal varices without bleeding CPT copyright 2019 [employer]. All rights reserved. The codes documented in this report are preliminary and upon  coder review may  be revised to meet current compliance requirements. Stanton Kidney MD, MD 01/17/2019 5:26:27 PM This report has been signed electronically. Number of Addenda: 0 Note Initiated On: 01/17/2019 5:04 PM      [facility]

## 2019-01-17 NOTE — ED Notes (Addendum)
Pt reports he vomited bright red blood. Reports about 1 cup. Pt states he drinks daily about a 12 pack per day.States he wants help getting off alcohol. Pt reports he drank 2 beers prior to coming here this morning. Pt also report he has had to have fluid drawn off his abd in the past. Pt appears jaundice and abd is extremely distended and taunt.
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
## 2019-01-18 ENCOUNTER — Inpatient Hospital Stay: Payer: Medicaid Other

## 2019-01-18 ENCOUNTER — Encounter: Payer: Self-pay | Admitting: Internal Medicine

## 2019-01-18 LAB — ALBUMIN, PLEURAL OR PERITONEAL FLUID: Albumin, Fluid: <1 g/dL

## 2019-01-18 LAB — BLOOD GAS, ARTERIAL
Acid-Base Excess: 3 mmol/L — ABNORMAL HIGH (ref 0.0–2.0)
Bicarbonate: 27.1 mmol/L (ref 20.0–28.0)
FIO2: 0.34
O2 Saturation: 93.1 %
Patient temperature: 37
pCO2 arterial: 39 mmHg (ref 32.0–48.0)
pH, Arterial: 7.45 (ref 7.350–7.450)
pO2, Arterial: 64 mmHg — ABNORMAL LOW (ref 83.0–108.0)

## 2019-01-18 LAB — CBC
HCT: 35.1 % — ABNORMAL LOW (ref 39.0–52.0)
Hemoglobin: 12.3 g/dL — ABNORMAL LOW (ref 13.0–17.0)
MCH: 35.9 pg — ABNORMAL HIGH (ref 26.0–34.0)
MCHC: 35 g/dL (ref 30.0–36.0)
MCV: 102.3 fL — ABNORMAL HIGH (ref 80.0–100.0)
Platelets: 50 10*3/uL — ABNORMAL LOW (ref 150–400)
RBC: 3.43 MIL/uL — ABNORMAL LOW (ref 4.22–5.81)
RDW: 15.1 % (ref 11.5–15.5)
WBC: 7.6 10*3/uL (ref 4.0–10.5)
nRBC: 0 % (ref 0.0–0.2)

## 2019-01-18 LAB — PROTEIN, PLEURAL OR PERITONEAL FLUID: Total protein, fluid: <3 g/dL

## 2019-01-18 LAB — GLUCOSE, PLEURAL OR PERITONEAL FLUID: Glucose, Fluid: 138 mg/dL

## 2019-01-18 LAB — BODY FLUID CELL COUNT WITH DIFFERENTIAL
Eos, Fluid: 0 %
Lymphs, Fluid: 47 %
Monocyte-Macrophage-Serous Fluid: 51 %
Neutrophil Count, Fluid: 2 %
Total Nucleated Cell Count, Fluid: 818 cu mm

## 2019-01-18 LAB — LACTATE DEHYDROGENASE, PLEURAL OR PERITONEAL FLUID: LD, Fluid: 57 U/L — ABNORMAL HIGH (ref 3–23)

## 2019-01-18 LAB — COMPREHENSIVE METABOLIC PANEL
ALT: 23 U/L (ref 0–44)
AST: 85 U/L — ABNORMAL HIGH (ref 15–41)
Albumin: 2.2 g/dL — ABNORMAL LOW (ref 3.5–5.0)
Alkaline Phosphatase: 132 U/L — ABNORMAL HIGH (ref 38–126)
Anion gap: 7 (ref 5–15)
BUN: 6 mg/dL (ref 6–20)
CO2: 24 mmol/L (ref 22–32)
Calcium: 7.6 mg/dL — ABNORMAL LOW (ref 8.9–10.3)
Chloride: 103 mmol/L (ref 98–111)
Creatinine, Ser: 0.64 mg/dL (ref 0.61–1.24)
GFR calc Af Amer: >60 mL/min (ref >60)
GFR calc non Af Amer: >60 mL/min (ref >60)
Glucose, Bld: 117 mg/dL — ABNORMAL HIGH (ref 70–99)
Potassium: 4.2 mmol/L (ref 3.5–5.1)
Sodium: 134 mmol/L — ABNORMAL LOW (ref 135–145)
Total Bilirubin: 5.4 mg/dL — ABNORMAL HIGH (ref 0.3–1.2)
Total Protein: 6.6 g/dL (ref 6.5–8.1)

## 2019-01-18 LAB — PROTIME-INR
INR: 1.9 — ABNORMAL HIGH (ref 0.8–1.2)
Prothrombin Time: 21.5 seconds — ABNORMAL HIGH (ref 11.4–15.2)

## 2019-01-18 LAB — HEMOGLOBIN AND HEMATOCRIT, BLOOD
HCT: 35.1 % — ABNORMAL LOW (ref 39.0–52.0)
Hemoglobin: 12.1 g/dL — ABNORMAL LOW (ref 13.0–17.0)

## 2019-01-18 LAB — AMYLASE, PLEURAL OR PERITONEAL FLUID

## 2019-01-18 LAB — GRAM STAIN

## 2019-01-18 LAB — AMMONIA: Ammonia: 105 umol/L — ABNORMAL HIGH (ref 9–35)

## 2019-01-18 LAB — AMYLASE, PLEURAL OR PERITONEAL FLUID??????? ??????: Amylase, Fluid: 22 U/L

## 2019-01-18 LAB — PROCALCITONIN: Procalcitonin: <0.1 ng/mL

## 2019-01-18 LAB — GLUCOSE, CAPILLARY: Glucose-Capillary: 108 mg/dL — ABNORMAL HIGH (ref 70–99)

## 2019-01-18 LAB — BILIRUBIN, TOTAL: Total Bilirubin: 6.8 mg/dL — ABNORMAL HIGH (ref 0.3–1.2)

## 2019-01-18 MED ORDER — NICOTINE 21 MG/24HR TD PT24
21.0000 mg | MEDICATED_PATCH | Freq: Every day | TRANSDERMAL | Status: DC
  Administered 2019-01-18 – 2019-01-23 (×6): 21 mg via TRANSDERMAL
  Filled 2019-01-18 (×6): qty 1

## 2019-01-18 MED ORDER — FOLIC ACID 1 MG PO TABS: 1.0000 mg | ORAL_TABLET | Freq: Every day | ORAL | Status: DC

## 2019-01-18 MED ORDER — LORAZEPAM 1 MG PO TABS: 1.0000 mg | ORAL_TABLET | Freq: Four times a day (QID) | ORAL | Status: AC | PRN

## 2019-01-18 MED ORDER — LORAZEPAM 2 MG/ML IJ SOLN
3.0000 mg | Freq: Once | INTRAMUSCULAR | Status: AC
  Administered 2019-01-18: 3 mg via INTRAVENOUS
  Filled 2019-01-18: qty 2

## 2019-01-18 MED ORDER — LORAZEPAM 2 MG/ML IJ SOLN
2.0000 mg | Freq: Once | INTRAMUSCULAR | Status: AC
  Administered 2019-01-18: 21:00:00 2 mg via INTRAVENOUS

## 2019-01-18 MED ORDER — LORAZEPAM 2 MG/ML IJ SOLN
1.0000 mg | Freq: Four times a day (QID) | INTRAMUSCULAR | Status: AC | PRN
  Administered 2019-01-18 – 2019-01-20 (×4): 1 mg via INTRAVENOUS
  Filled 2019-01-18 (×4): qty 1

## 2019-01-18 MED ORDER — ADULT MULTIVITAMIN W/MINERALS CH: 1.0000 | ORAL_TABLET | Freq: Every day | ORAL | Status: DC

## 2019-01-18 MED ORDER — ADULT MULTIVITAMIN W/MINERALS CH
1.0000 | ORAL_TABLET | Freq: Every day | ORAL | Status: DC
  Administered 2019-01-18 – 2019-01-23 (×6): 1 via ORAL
  Filled 2019-01-18 (×6): qty 1

## 2019-01-18 MED ORDER — LORAZEPAM 2 MG/ML IJ SOLN
INTRAMUSCULAR | Status: AC
  Filled 2019-01-18: qty 1

## 2019-01-18 MED ORDER — FUROSEMIDE 40 MG PO TABS
40.0000 mg | ORAL_TABLET | Freq: Once | ORAL | Status: AC
  Administered 2019-01-18: 40 mg via ORAL
  Filled 2019-01-18: qty 1

## 2019-01-18 MED ORDER — THIAMINE HCL 100 MG/ML IJ SOLN
100.0000 mg | Freq: Every day | INTRAMUSCULAR | Status: DC
  Administered 2019-01-19: 100 mg via INTRAVENOUS
  Filled 2019-01-18: qty 2

## 2019-01-18 MED ORDER — SODIUM CHLORIDE 0.9% FLUSH: 10.0000 mL | INTRAVENOUS | Status: DC | PRN

## 2019-01-18 MED ORDER — FOLIC ACID 1 MG PO TABS
1.0000 mg | ORAL_TABLET | Freq: Every day | ORAL | Status: DC
  Administered 2019-01-18 – 2019-01-23 (×6): 1 mg via ORAL
  Filled 2019-01-18 (×6): qty 1

## 2019-01-18 MED ORDER — SODIUM CHLORIDE 0.9% FLUSH
10.0000 mL | Freq: Two times a day (BID) | INTRAVENOUS | Status: DC
  Administered 2019-01-18 – 2019-01-22 (×8): 10 mL

## 2019-01-18 MED ORDER — VITAMIN B-1 100 MG PO TABS
100.0000 mg | ORAL_TABLET | Freq: Every day | ORAL | Status: DC
  Administered 2019-01-18 – 2019-01-23 (×5): 100 mg via ORAL
  Filled 2019-01-18 (×5): qty 1

## 2019-01-18 MED ORDER — SPIRONOLACTONE 25 MG PO TABS
25.0000 mg | ORAL_TABLET | Freq: Every day | ORAL | Status: DC
  Administered 2019-01-18 – 2019-01-23 (×6): 25 mg via ORAL
  Filled 2019-01-18 (×6): qty 1

## 2019-01-18 NOTE — Procedures (Signed)
  Procedure: US paracentesis   EBL:   minimal Complications:  none immediate  See full dictation in [facility] PACS.  D. Darrion Wyszynski MD Main # [phone] Pager  [phone]    
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
## 2019-01-18 NOTE — Progress Notes (Signed)
Peripherally Inserted Central Catheter/Midline Placement  The IV Nurse has discussed with the patient and/or persons authorized to consent for the patient, the purpose of this procedure and the potential benefits and risks involved with this procedure.  The benefits include less needle sticks, lab draws from the catheter, and the patient may be discharged home with the catheter. Risks include, but not limited to, infection, bleeding, blood clot (thrombus formation), and puncture of an artery; nerve damage and irregular heartbeat and possibility to perform a PICC exchange if needed/ordered by physician.  Alternatives to this procedure were also discussed.  Bard Power PICC patient education guide, fact sheet on infection prevention and patient information card has been provided to patient /or left at bedside.    PICC/Midline Placement Documentation  PICC Double Lumen 01/18/19 PICC Right Basilic 37 cm 0 cm (Active)  Indication for Insertion or Continuance of Line Prolonged intravenous therapies 01/18/2019  3:03 PM  Exposed Catheter (cm) 0 cm 01/18/2019  3:03 PM  Site Assessment Clean;Dry;Intact 01/18/2019  3:03 PM  Lumen #1 Status Flushed;Blood return noted 01/18/2019  3:03 PM  Lumen #2 Status Flushed;Blood return noted 01/18/2019  3:03 PM  Dressing Type Transparent 01/18/2019  3:03 PM  Dressing Status Clean;Dry;Intact;Antimicrobial disc in place 01/18/2019  3:03 PM  Dressing Intervention New dressing 01/18/2019  3:03 PM  Dressing Change Due 01/25/19 01/18/2019  3:03 PM       Reginia Forts Albarece 01/18/2019, 3:04 PM

## 2019-01-18 NOTE — TOC Initial Note (Signed)
Transition of Care [facility]) - Initial/Assessment Note    Patient Details  Name: Frederick Cabrera MRN: [id number] Date of Birth: 12-Jul-1966  [facility] CM/SW Contact:    Elliot Gault, LCSW Phone Number: 01/18/2019, 1:35 PM  Clinical Narrative:                  Reviewed pt's record today and attempted to meet with pt for assessment. MD and RN with pt at the time of attempted visit. Per RN, pt confused and experiencing ETOH withdrawal. It does not appear pt is able to participate in [facility] assessment at this time. From the record, pt moved from [place] about 6 months ago. It appears pt and his sig other live with pt's brother. Pt's brother informed MD that pt receives SSDI. It does not appear that pt has Medicare or any other form of insurance. Pt does not have a PCP.  When pt is able to participate in assessment, will provide resource information on AA, outpt ETOH tx for uninsured, [employer] for [employer] application, and will assist with referral to [facility] for follow up medical care.   Expected Discharge Plan: Home/Self Care Barriers to Discharge: Continued Medical Work up   Patient Goals and CMS Choice        Expected Discharge Plan and Services Expected Discharge Plan: Home/Self Care                                              Prior Living Arrangements/Services   Lives with:: Significant Other Patient language and need for interpreter reviewed:: Yes Do you feel safe going back to the place where you live?: Yes      Need for Family Participation in Patient Care: No (Comment) Care giver support system in place?: Yes (comment)   Criminal Activity/Legal Involvement Pertinent to Current Situation/Hospitalization: No - Comment as needed  Activities of Daily Living Home Assistive Devices/Equipment: Cane (specify quad or straight), Eyeglasses, Dentures (specify type) ADL Screening (condition at time of admission) Patient's cognitive ability  adequate to safely complete daily activities?: Yes Is the patient deaf or have difficulty hearing?: No Does the patient have difficulty seeing, even when wearing glasses/contacts?: No Does the patient have difficulty concentrating, remembering, or making decisions?: No Patient able to express need for assistance with ADLs?: Yes Does the patient have difficulty dressing or bathing?: No Independently performs ADLs?: Yes (appropriate for developmental age) Does the patient have difficulty walking or climbing stairs?: Yes Weakness of Legs: Both Weakness of Arms/Hands: None  Permission Sought/Granted                  Emotional Assessment Appearance:: Appears stated age Attitude/Demeanor/Rapport: Unable to Assess Affect (typically observed): Unable to Assess Orientation: : Oriented to Self Alcohol / Substance Use: Alcohol Use Psych Involvement: No (comment)  Admission diagnosis:  Alcoholic cirrhosis of liver with ascites (HCC) [K70.31] Hematemesis with nausea [K92.0] Patient Active Problem List   Diagnosis Date Noted  . Hematemesis 01/17/2019   PCP:  Patient, No Pcp Per Pharmacy:  No Pharmacies Listed    Social Determinants of Health (SDOH) Interventions    Readmission Risk Interventions No flowsheet data found.

## 2019-01-18 NOTE — Progress Notes (Signed)
Patient breakfast order placed. Day shift RN notified that patient will need assistance with ordering other meals.

## 2019-01-18 NOTE — Progress Notes (Signed)
Spoke with floor RN, patient has 3 wotking PIV at this time. RN aware will place PICC later today.

## 2019-01-19 ENCOUNTER — Inpatient Hospital Stay: Payer: Medicaid Other

## 2019-01-19 DIAGNOSIS — Z515 Encounter for palliative care: Secondary | ICD-10-CM

## 2019-01-19 DIAGNOSIS — Z7189 Other specified counseling: Secondary | ICD-10-CM

## 2019-01-19 DIAGNOSIS — K7031 Alcoholic cirrhosis of liver with ascites: Secondary | ICD-10-CM

## 2019-01-19 LAB — CBC
HCT: 30.8 % — ABNORMAL LOW (ref 39.0–52.0)
Hemoglobin: 10.7 g/dL — ABNORMAL LOW (ref 13.0–17.0)
MCH: 36.1 pg — ABNORMAL HIGH (ref 26.0–34.0)
MCHC: 34.7 g/dL (ref 30.0–36.0)
MCV: 104.1 fL — ABNORMAL HIGH (ref 80.0–100.0)
Platelets: 41 10*3/uL — ABNORMAL LOW (ref 150–400)
RBC: 2.96 MIL/uL — ABNORMAL LOW (ref 4.22–5.81)
RDW: 14.8 % (ref 11.5–15.5)
WBC: 11.3 10*3/uL — ABNORMAL HIGH (ref 4.0–10.5)
nRBC: 0 % (ref 0.0–0.2)

## 2019-01-19 LAB — TRIGLYCERIDES, BODY FLUIDS: Triglycerides, Fluid: 25 mg/dL

## 2019-01-19 LAB — PROTEIN, BODY FLUID (OTHER): Total Protein, Body Fluid Other: 1.5 g/dL

## 2019-01-19 LAB — AMMONIA: Ammonia: 88 umol/L — ABNORMAL HIGH (ref 9–35)

## 2019-01-19 LAB — PH, BODY FLUID: pH, Body Fluid: 7.6

## 2019-01-19 LAB — HIV ANTIBODY (ROUTINE TESTING W REFLEX): HIV Screen 4th Generation wRfx: NONREACTIVE

## 2019-01-19 LAB — PROTIME-INR
INR: 3 — ABNORMAL HIGH (ref 0.8–1.2)
Prothrombin Time: 30.9 seconds — ABNORMAL HIGH (ref 11.4–15.2)

## 2019-01-19 MED ORDER — THROMBIN 5000 UNITS EX SOLR
5000.0000 [IU] | Freq: Once | CUTANEOUS | Status: AC
  Administered 2019-01-19: 5000 [IU] via TOPICAL
  Filled 2019-01-19: qty 5000
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
## 2019-01-19 NOTE — Progress Notes (Signed)
Ativan given x2 this shift per CIWA scale and  agitation. Patient had paracentesis on yesterday, bleeding from paracentesis site all shift, dressing changed multiple times this shift,  MD notified throughout shift, Hemoglobin stable , vital signs stable. CBC, ammonia and ct scan ordered for this am, will continue to monitor.
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
## 2019-01-19 NOTE — Progress Notes (Signed)
Patient ID: Frederick Cabrera, male   DOB: 1966-01-24, 53 y.o.   MRN: [id number]  CTSP re bleeding at yesterday's paracentesis skin entry site. Floor team has tried pressure gauze packing without and with fibrillar, without success. On exam, minimal echymosis around RUQ entry site with slow ooze which is easily controlled by gentle finger pinch. I cleansed the area and applied thrombin-soaked fibrillar. No further bleeding noted. Gauze peanut placed overlying site, then covered with tegaderm.  Will f/u tomorrow to make sure this is hemostatic.

## 2019-01-20 LAB — CYTOLOGY - NON PAP

## 2019-01-20 MED ORDER — PANTOPRAZOLE SODIUM 40 MG PO TBEC
40.0000 mg | DELAYED_RELEASE_TABLET | Freq: Two times a day (BID) | ORAL | Status: DC
  Administered 2019-01-20 – 2019-01-22 (×5): 40 mg via ORAL
  Filled 2019-01-20 (×5): qty 1

## 2019-01-20 MED ORDER — LACTULOSE 10 GM/15ML PO SOLN
30.0000 g | Freq: Every day | ORAL | Status: DC
  Administered 2019-01-20: 30 g via ORAL
  Filled 2019-01-20: qty 60

## 2019-01-20 MED ORDER — LACTULOSE ENEMA
300.0000 mL | Freq: Every day | ORAL | Status: DC
  Filled 2019-01-20: qty 300

## 2019-01-20 NOTE — Progress Notes (Signed)
Pt slept most of the shift. No bleeding from paracentesis site, dsg dry and intact.  No CIWA interventions needed this shift.
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
## 2019-01-21 LAB — BASIC METABOLIC PANEL
Anion gap: 3 — ABNORMAL LOW (ref 5–15)
BUN: 10 mg/dL (ref 6–20)
CO2: 26 mmol/L (ref 22–32)
Calcium: 7.5 mg/dL — ABNORMAL LOW (ref 8.9–10.3)
Chloride: 102 mmol/L (ref 98–111)
Creatinine, Ser: 0.42 mg/dL — ABNORMAL LOW (ref 0.61–1.24)
GFR calc Af Amer: >60 mL/min (ref >60)
GFR calc non Af Amer: >60 mL/min (ref >60)
Glucose, Bld: 112 mg/dL — ABNORMAL HIGH (ref 70–99)
Potassium: 3.7 mmol/L (ref 3.5–5.1)
Sodium: 131 mmol/L — ABNORMAL LOW (ref 135–145)

## 2019-01-21 LAB — AMMONIA: Ammonia: 75 umol/L — ABNORMAL HIGH (ref 9–35)

## 2019-01-21 LAB — LIPASE, FLUID: Lipase-Fluid: 24 U/L

## 2019-01-21 MED ORDER — TRAZODONE HCL 50 MG PO TABS
25.0000 mg | ORAL_TABLET | Freq: Once | ORAL | Status: AC
  Administered 2019-01-21: 25 mg via ORAL
  Filled 2019-01-21: qty 1

## 2019-01-21 MED ORDER — TRAMADOL HCL 50 MG PO TABS
50.0000 mg | ORAL_TABLET | Freq: Three times a day (TID) | ORAL | Status: DC | PRN
  Administered 2019-01-21: 50 mg via ORAL
  Filled 2019-01-21 (×2): qty 1

## 2019-01-21 MED ORDER — LACTULOSE 10 GM/15ML PO SOLN
30.0000 g | Freq: Three times a day (TID) | ORAL | Status: DC
  Administered 2019-01-21 – 2019-01-22 (×4): 30 g via ORAL
  Filled 2019-01-21 (×4): qty 60

## 2019-01-21 MED ORDER — SODIUM CHLORIDE 0.9 % IV SOLN
INTRAVENOUS | Status: AC
  Administered 2019-01-21 (×2): via INTRAVENOUS
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
## 2019-01-21 NOTE — TOC Progression Note (Signed)
Transition of Care [facility]) - Progression Note    Patient Details  Name: Frederick Cabrera MRN: [id number] Date of Birth: [birth year]  Transition of Care [facility]) CM/SW Contact  Barrie Dunker, RN Phone Number: 01/21/2019, 1:27 PM  Clinical Narrative:     Patient is a 2+ assist and PT is recommending SNF, he has no insurance, has applied for Medicaid Notified Brad with [employer] that he may go to SNF at DC, not sure yet Spoke with Kandee Keen at [facility] and let him know the patient is not ready for DC yet   Expected Discharge Plan: Skilled Nursing Facility Barriers to Discharge: Continued Medical Work up  Expected Discharge Plan and Services Expected Discharge Plan: Skilled Nursing Facility   Discharge Planning Services: CM Consult   Living arrangements for the past 2 months: Single Family Home                 DME Arranged: Walker rolling, 3-N-1 DME Agency: [employer] Date DME Agency Contacted: 01/21/19 Time DME Agency Contacted: 1235 Representative spoke with at DME Agency: Nida Boatman HH Arranged: PT HH Agency: [facility] Date [facility] Agency Contacted: 01/21/19 Time HH Agency Contacted: 1236 Representative spoke with at [facility] Agency: Kandee Keen   Social Determinants of Health (SDOH) Interventions    Readmission Risk Interventions No flowsheet data found.
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
## 2019-01-22 LAB — BASIC METABOLIC PANEL
Anion gap: 5 (ref 5–15)
BUN: 10 mg/dL (ref 6–20)
CO2: 23 mmol/L (ref 22–32)
Calcium: 7.5 mg/dL — ABNORMAL LOW (ref 8.9–10.3)
Chloride: 105 mmol/L (ref 98–111)
Creatinine, Ser: 0.55 mg/dL — ABNORMAL LOW (ref 0.61–1.24)
GFR calc Af Amer: >60 mL/min (ref >60)
GFR calc non Af Amer: >60 mL/min (ref >60)
Glucose, Bld: 104 mg/dL — ABNORMAL HIGH (ref 70–99)
Potassium: 3.6 mmol/L (ref 3.5–5.1)
Sodium: 133 mmol/L — ABNORMAL LOW (ref 135–145)

## 2019-01-22 LAB — AMMONIA: Ammonia: 46 umol/L — ABNORMAL HIGH (ref 9–35)

## 2019-01-22 MED ORDER — SPIRONOLACTONE 25 MG PO TABS: 25.0000 mg | ORAL_TABLET | Freq: Every day | ORAL | 0 refills | Status: DC

## 2019-01-22 MED ORDER — NICOTINE 21 MG/24HR TD PT24: 21.0000 mg | MEDICATED_PATCH | Freq: Every day | TRANSDERMAL | 0 refills | Status: DC

## 2019-01-22 MED ORDER — LACTULOSE 10 GM/15ML PO SOLN
30.0000 g | Freq: Every day | ORAL | Status: DC
  Filled 2019-01-22: qty 60

## 2019-01-22 MED ORDER — LACTULOSE 10 GM/15ML PO SOLN: 30.0000 g | Freq: Every day | ORAL | 0 refills | Status: DC

## 2019-01-22 MED ORDER — NADOLOL 20 MG PO TABS: 20.0000 mg | ORAL_TABLET | Freq: Every day | ORAL | 0 refills | Status: DC

## 2019-01-22 MED ORDER — PANTOPRAZOLE SODIUM 40 MG PO TBEC: 40.0000 mg | DELAYED_RELEASE_TABLET | Freq: Every day | ORAL | 0 refills | Status: DC
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
## 2019-01-22 NOTE — Progress Notes (Signed)
Pt oxygen is sating at 97-100% on room air. No O2 needs at this time.
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
## 2019-01-23 MED ORDER — PANTOPRAZOLE SODIUM 40 MG PO TBEC: 40.0000 mg | DELAYED_RELEASE_TABLET | Freq: Two times a day (BID) | ORAL | 2 refills | Status: DC

## 2019-01-23 MED ORDER — PANTOPRAZOLE SODIUM 40 MG PO TBEC
40.0000 mg | DELAYED_RELEASE_TABLET | Freq: Two times a day (BID) | ORAL | Status: DC
  Administered 2019-01-23: 40 mg via ORAL
  Filled 2019-01-23: qty 1

## 2019-01-23 MED ORDER — LACTULOSE 10 GM/15ML PO SOLN: 30.0000 g | Freq: Every day | ORAL | 0 refills | Status: DC

## 2019-01-23 MED ORDER — NADOLOL 20 MG PO TABS: 20.0000 mg | ORAL_TABLET | Freq: Every day | ORAL | 2 refills | Status: DC

## 2019-01-23 MED ORDER — SPIRONOLACTONE 25 MG PO TABS: 25.0000 mg | ORAL_TABLET | Freq: Every day | ORAL | 2 refills | Status: DC

## 2019-01-23 NOTE — Progress Notes (Signed)
Security at bedside to return personal belonging back to pt (see form in chart).

## 2019-01-23 NOTE — Progress Notes (Signed)
DISCHARGE NOTE:  Pt given discharge instructions sand scripts. Pt verbalized understanding. EMS called for transportation.

## 2019-01-23 NOTE — Progress Notes (Signed)
PT Cancellation Note  Patient Details Name: Frederick Cabrera MRN: [id number] DOB: 11/24/1965   Cancelled Treatment:    Reason Eval/Treat Not Completed: Patient declined, no reason specified.  Pt refusing PT today and reports he will walk with his brother when he leaves.  Pt educated on importance of mobility and encouraged to participate in therapy but pt continued to refuse.  Nurse notified.  Pt reports discharging today.  If pt does not discharge, will re-attempt PT treatment at a later date/time.  Hendricks Limes, PT 01/23/19, 10:56 AM [phone]

## 2019-01-23 NOTE — Progress Notes (Signed)
EMS here to transport pt home.  

## 2019-01-23 NOTE — TOC Transition Note (Signed)
Transition of Care [facility]) - CM/SW Discharge Note   Patient Details  Name: Frederick Cabrera MRN: [id number] Date of Birth: 1966/05/26  [facility] of [facility]) CM/SW Contact:  Virgel Manifold, RN Phone Number: 01/23/2019, 4:45 PM   Clinical Narrative: Patient to be discharged per MD order. Orders in place for home health services. TOC team had completed work up for charity home health however since patient was refusing to particiapte in PT [place] said that they would need to see proof that the patient was willing to participate in rehab services. Patient had decline PT last two sessions. Patient and I had a conversation about making sure to do therapies etc and the patient stated that "his brother would walk with him since that's all we want to see him do". I explained to the patient the criteria he needed to meet to be able to get charity home health and the patient express disinterest and is not willing to participate in the need activities to be able to show documentation of his willingness to participate in therapies, etc. Notified Cory from [place] that at this time patient is not willing to cooperate in any form of home health. Notified primary MD as well who had conversations with patient. Patient has Rolator at home and a lift chair per his brother Windy Fast. I expressed to Windy Fast that the patient was not willing to participate in the necessary activities need and that he refused home health/PT. Windy Fast expressed that he wishes his brother would "Act right" and allow PT to work with him. We discussed finding a PCP and completing the medicaid application at length. They inquired about outpatient PT places in the area and I was able to list several of those. They mentioned buying a hospital bed for him if he does not improve and we talked about obtaining orders for DME from his PCP once he has one. EMS was used for transport.       Final next level of care: Home/Self Care Barriers to Discharge: No Barriers  Identified   Patient Goals and CMS Choice Patient states their goals for this hospitalization and ongoing recovery are:: unable to assess CMS Medicare.gov Compare Post Acute Care list provided to:: Patient Represenative (must comment)(pt brother)    Discharge Placement                       Discharge Plan and Services   Discharge Planning Services: CM Consult            DME Arranged: Dan Humphreys rolling, 3-N-1 DME Agency: [employer] Date DME Agency Contacted: 01/21/19 Time DME Agency Contacted: 1235 Representative spoke with at DME Agency: Nida Boatman HH Arranged: Patient Refused [facility] HH Agency: [facility] Date [facility] Agency Contacted: 01/22/19 Time HH Agency Contacted: 1604 Representative spoke with at [facility] Agency: Kandee Keen  Social Determinants of Health (SDOH) Interventions     Readmission Risk Interventions No flowsheet data found.

## 2019-01-23 NOTE — Discharge Summary (Signed)
[facility] - [place] at [facility]   PATIENT NAME: Frederick Cabrera    MR#:  [id number]  DATE OF BIRTH:  15-Jul-1966  DATE OF ADMISSION:  01/17/2019   ADMITTING PHYSICIAN: Ramonita Lab, MD  DATE OF DISCHARGE:  01/23/2019  PRIMARY CARE PHYSICIAN: Patient, No Pcp Per   ADMISSION DIAGNOSIS:   Alcoholic cirrhosis of liver with ascites (HCC) [K70.31] Hematemesis with nausea [K92.0]  DISCHARGE DIAGNOSIS:   Active Problems:   Hematemesis   Alcoholic cirrhosis of liver with ascites (HCC)   Goals of care, counseling/discussion   Palliative care by specialist   SECONDARY DIAGNOSIS:   History reviewed. No pertinent past medical history.  HOSPITAL COURSE:   53 year old male with past medical history significant for alcoholic liver disease, ongoing smoking presents to hospital secondary to abdominal pain and hematemesis  1.  Hematemesis-secondary to esophageal varices and portal hypertensive gastropathy. -Appreciate GI consult.  Patient underwent EGD this admission showing grade 2 esophageal varices, reflux esophagitis and portal hypertensive gastropathy. -He was on IV PPI and octreotide which has been discontinued.  No active bleeding at this time. -Started on Protonix-twice a day for now -Outpatient GI follow-up.  Nadolol added for portal hypertension  2.  Ascites-secondary to underlying liver cirrhosis, status post paracentesis and 5 L fluid removal this admission  3.  Acute hepatic encephalopathy-confusion is much improved.  Continue lactulose for now and titrated to 2-3 bowel movements every day. -Ammonia is in the 40s prior to discharge  4.  Electrolyte abnormalities-hyponatremia and hypokalemia much improved now  5.  Alcohol abuse-continue supplements with thiamine and folic acid.  No significant withdrawals this hospitalization -On nicotine patch for smoking  Physical therapy recommended rehab, patient is confident that he will be going home to live with  his brother and brother's family agreed.  He will be discharged with home health services  DISCHARGE CONDITIONS:   Guarded  CONSULTS OBTAINED:   GI consult by Dr. Norma Fredrickson  DRUG ALLERGIES:   No Known Allergies DISCHARGE MEDICATIONS:   Allergies as of 01/23/2019   No Known Allergies     Medication List    STOP taking these medications   furosemide 80 MG tablet Commonly known as:  LASIX     TAKE these medications   folic acid 1 MG tablet Commonly known as:  FOLVITE Take 1 mg by mouth daily.   lactulose 10 GM/15ML solution Commonly known as:  CHRONULAC Take 45 mLs (30 g total) by mouth daily.   nadolol 20 MG tablet Commonly known as:  CORGARD Take 1 tablet (20 mg total) by mouth daily.   nicotine 21 mg/24hr patch Commonly known as:  NICODERM CQ - dosed in mg/24 hours Place 1 patch (21 mg total) onto the skin daily.   pantoprazole 40 MG tablet Commonly known as:  PROTONIX Take 1 tablet (40 mg total) by mouth 2 (two) times daily before a meal.   spironolactone 25 MG tablet Commonly known as:  ALDACTONE Take 1 tablet (25 mg total) by mouth daily. What changed:    medication strength  how much to take   thiamine 100 MG tablet Take 200 mg by mouth daily.            Durable Medical Equipment  (From admission, onward)         Start     Ordered   01/22/19 1326  For home use only DME Walker rolling  Once    Question:  Patient needs a walker to  treat with the following condition  Answer:  Weakness   01/22/19 1325   01/22/19 1326  For home use only DME oxygen  Once    Question Answer Comment  Mode or (Route) Nasal cannula   Liters per Minute 2   Frequency Continuous (stationary and portable oxygen unit needed)   Oxygen conserving device Yes   Oxygen delivery system Gas      01/22/19 1325   01/22/19 1324  For home use only DME Bedside commode  Once    Question:  Patient needs a bedside commode to treat with the following condition  Answer:  Weakness    01/22/19 1325           DISCHARGE INSTRUCTIONS:   1. PCP f/u in 1-2 weeks 2. GI f/u in 2 weeks  DIET:   Cardiac diet  ACTIVITY:   Activity as tolerated  OXYGEN:   Home Oxygen: No.  Oxygen Delivery: room air  DISCHARGE LOCATION:   home   If you experience worsening of your admission symptoms, develop shortness of breath, life threatening emergency, suicidal or homicidal thoughts you must seek medical attention immediately by calling 911 or calling your MD immediately  if symptoms less severe.  You Must read complete instructions/literature along with all the possible adverse reactions/side effects for all the Medicines you take and that have been prescribed to you. Take any new Medicines after you have completely understood and accpet all the possible adverse reactions/side effects.   Please note  You were cared for by a hospitalist during your hospital stay. If you have any questions about your discharge medications or the care you received while you were in the hospital after you are discharged, you can call the unit and asked to speak with the hospitalist on call if the hospitalist that took care of you is not available. Once you are discharged, your primary care physician will handle any further medical issues. Please note that NO REFILLS for any discharge medications will be authorized once you are discharged, as it is imperative that you return to your primary care physician (or establish a relationship with a primary care physician if you do not have one) for your aftercare needs so that they can reassess your need for medications and monitor your lab values.    On the day of Discharge:  VITAL SIGNS:   Blood pressure 127/78, pulse 93, temperature 98.3 F (36.8 C), resp. rate 20, height 5\' 5"  (1.651 m), weight 97.4 kg, SpO2 99 %.  PHYSICAL EXAMINATION:    GENERAL:  53 y.o.-year-old patient lying in the bed with no acute distress.  EYES: Pupils equal, round,  reactive to light and accommodation. No scleral icterus. Extraocular muscles intact.  HEENT: Head atraumatic, normocephalic. Oropharynx and nasopharynx clear.  NECK:  Supple, no jugular venous distention. No thyroid enlargement, no tenderness.  LUNGS: Normal breath sounds bilaterally, no wheezing, rales,rhonchi or crepitation. No use of accessory muscles of respiration.  Creased bibasilar breath sounds. CARDIOVASCULAR: S1, S2 normal. No murmurs, rubs, or gallops.  ABDOMEN: Soft, non-tender,  distended. Bowel sounds present. No organomegaly or mass.  EXTREMITIES: No pedal edema, cyanosis, or clubbing.  NEUROLOGIC: Cranial nerves II through XII are intact. Muscle strength 5/5 in all extremities. Sensation intact. Gait not checked.  PSYCHIATRIC: The patient is alert and oriented x 3.  SKIN: No obvious rash, lesion, or ulcer.  Spider nevi, noted all over on the chest anteriorly. -Bruising of the right arm noted.  PICC line is present  which will be removed prior to discharge  DATA REVIEW:   CBC Recent Labs  Lab 01/19/19 0628  WBC 11.3*  HGB 10.7*  HCT 30.8*  PLT 41*    Chemistries  Recent Labs  Lab 01/18/19 0503 01/18/19 1228  01/22/19 0538  NA 134*  --    < > 133*  K 4.2  --    < > 3.6  CL 103  --    < > 105  CO2 24  --    < > 23  GLUCOSE 117*  --    < > 104*  BUN 6  --    < > 10  CREATININE 0.64  --    < > 0.55*  CALCIUM 7.6*  --    < > 7.5*  AST 85*  --   --   --   ALT 23  --   --   --   ALKPHOS 132*  --   --   --   BILITOT 5.4* 6.8*  --   --    < > = values in this interval not displayed.     Microbiology Results  Results for orders placed or performed during the hospital encounter of 01/17/19  Gram stain     Status: None   Collection Time: 01/18/19 11:30 AM  Result Value Ref Range Status   Specimen Description   Final    PERITONEAL Performed at [facility], [address], [place], [place] [address]    Special Requests   Final    NONE Performed at  [facility], [address] [place], [place] [address]    Gram Stain   Final    MODERATE WBC PRESENT, PREDOMINANTLY MONONUCLEAR NO ORGANISMS SEEN Performed at [facility], [address], [place], [place] [address]    Report Status 01/18/2019 FINAL  Final    RADIOLOGY:  No results found.   Management plans discussed with the patient, family and they are in agreement.  CODE STATUS:     Code Status Orders  (From admission, onward)         Start     Ordered   01/17/19 0931  Full code  Continuous     01/17/19 0930        Code Status History    Date Active Date Inactive Code Status Order ID Comments User Context   01/17/2019 0810 01/17/2019 0930 Full Code [id number]  Nita Sickle, MD ED      TOTAL TIME TAKING CARE OF THIS PATIENT: 38 minutes.    Enid Baas M.D on 01/23/2019 at 10:48 AM  Between 7am to 6pm - Pager - [phone]  After 6pm go to [email or web address] - Social research officer, government  [facility] [place] [facility]  Office  [phone]  CC: Primary care physician; Patient, No Pcp Per   Note: This dictation was prepared with Dragon dictation along with smaller phrase technology. Any transcriptional errors that result from this process are unintentional.

## 2019-01-28 NOTE — TOC Progression Note (Signed)
Transition of Care (TOC) - Progression Note  Frederick Cabrera the sister in law called and stated that Frederick Cabrera does not have the patient in their records, I called Frederick Cabrera with [place] and he stated that they have been calling both the patient and the Sister in law as well as the brother and not gotten an answer or a call back.  I gave the phone number that the sister in law provided and Frederick Cabrera from [place] agreed to call again    Patient Details  Name: Frederick Cabrera MRN: [id number] Date of Birth: [birth year]  Transition of Care [facility]) CM/SW Contact  Barrie Dunker, RN Phone Number: 01/28/2019, 11:12 AM  Clinical Narrative:       Expected Discharge Plan: Home w Home Health Services Barriers to Discharge: No Barriers Identified  Expected Discharge Plan and Services Expected Discharge Plan: Home w Home Health Services   Discharge Planning Services: CM Consult   Living arrangements for the past 2 months: Single Family Home Expected Discharge Date: 01/23/19               DME Arranged: Dan Humphreys rolling, 3-N-1 DME Agency: [employer] Date DME Agency Contacted: 01/21/19 Time DME Agency Contacted: [phone] Representative spoke with at DME Agency: Maryla Morrow Arranged: Patient Refused [facility] HH Agency: [facility] Date [facility] Agency Contacted: 01/22/19 Time HH Agency Contacted: 1604 Representative spoke with at [facility] Agency: Frederick Cabrera   Social Determinants of Health (SDOH) Interventions    Readmission Risk Interventions No flowsheet data found.

## 2019-02-05 ENCOUNTER — Encounter: Payer: Self-pay | Admitting: Emergency Medicine

## 2019-02-05 ENCOUNTER — Observation Stay
Admission: EM | Admit: 2019-02-05 | Discharge: 2019-02-06 | Disposition: A | Discharge status: Home / Self Care | Payer: Medicaid Other | Attending: Internal Medicine | Admitting: Internal Medicine

## 2019-02-05 ENCOUNTER — Other Ambulatory Visit: Payer: Self-pay

## 2019-02-05 DIAGNOSIS — Z1159 Encounter for screening for other viral diseases: Secondary | ICD-10-CM | POA: Diagnosis not present

## 2019-02-05 DIAGNOSIS — K7290 Hepatic failure, unspecified without coma: Secondary | ICD-10-CM | POA: Insufficient documentation

## 2019-02-05 DIAGNOSIS — Z716 Tobacco abuse counseling: Secondary | ICD-10-CM | POA: Diagnosis not present

## 2019-02-05 DIAGNOSIS — K7031 Alcoholic cirrhosis of liver with ascites: Principal | ICD-10-CM | POA: Diagnosis present

## 2019-02-05 DIAGNOSIS — K7011 Alcoholic hepatitis with ascites: Secondary | ICD-10-CM | POA: Diagnosis present

## 2019-02-05 DIAGNOSIS — F17200 Nicotine dependence, unspecified, uncomplicated: Secondary | ICD-10-CM | POA: Insufficient documentation

## 2019-02-05 DIAGNOSIS — R945 Abnormal results of liver function studies: Secondary | ICD-10-CM | POA: Insufficient documentation

## 2019-02-05 DIAGNOSIS — Z79899 Other long term (current) drug therapy: Secondary | ICD-10-CM | POA: Insufficient documentation

## 2019-02-05 DIAGNOSIS — E871 Hypo-osmolality and hyponatremia: Secondary | ICD-10-CM | POA: Diagnosis not present

## 2019-02-05 DIAGNOSIS — R188 Other ascites: Secondary | ICD-10-CM

## 2019-02-05 HISTORY — DX: Alcoholic cirrhosis of liver with ascites: K70.31

## 2019-02-05 LAB — CBC
HCT: 30.3 % — ABNORMAL LOW (ref 39.0–52.0)
Hemoglobin: 10.8 g/dL — ABNORMAL LOW (ref 13.0–17.0)
MCH: 35.4 pg — ABNORMAL HIGH (ref 26.0–34.0)
MCHC: 35.6 g/dL (ref 30.0–36.0)
MCV: 99.3 fL (ref 80.0–100.0)
Platelets: 112 10*3/uL — ABNORMAL LOW (ref 150–400)
RBC: 3.05 MIL/uL — ABNORMAL LOW (ref 4.22–5.81)
RDW: 15.5 % (ref 11.5–15.5)
WBC: 8.5 10*3/uL (ref 4.0–10.5)
nRBC: 0 % (ref 0.0–0.2)

## 2019-02-05 LAB — COMPREHENSIVE METABOLIC PANEL
ALT: 28 U/L (ref 0–44)
AST: 68 U/L — ABNORMAL HIGH (ref 15–41)
Albumin: 2.2 g/dL — ABNORMAL LOW (ref 3.5–5.0)
Alkaline Phosphatase: 138 U/L — ABNORMAL HIGH (ref 38–126)
Anion gap: 7 (ref 5–15)
BUN: 7 mg/dL (ref 6–20)
CO2: 20 mmol/L — ABNORMAL LOW (ref 22–32)
Calcium: 7.8 mg/dL — ABNORMAL LOW (ref 8.9–10.3)
Chloride: 100 mmol/L (ref 98–111)
Creatinine, Ser: 0.73 mg/dL (ref 0.61–1.24)
GFR calc Af Amer: >60 mL/min (ref >60)
GFR calc non Af Amer: >60 mL/min (ref >60)
Glucose, Bld: 108 mg/dL — ABNORMAL HIGH (ref 70–99)
Potassium: 4 mmol/L (ref 3.5–5.1)
Sodium: 127 mmol/L — ABNORMAL LOW (ref 135–145)
Total Bilirubin: 5.1 mg/dL — ABNORMAL HIGH (ref 0.3–1.2)
Total Protein: 7.1 g/dL (ref 6.5–8.1)

## 2019-02-05 LAB — PROTIME-INR
INR: 1.8 — ABNORMAL HIGH (ref 0.8–1.2)
Prothrombin Time: 20.2 seconds — ABNORMAL HIGH (ref 11.4–15.2)

## 2019-02-05 LAB — ETHANOL: Alcohol, Ethyl (B): <10 mg/dL (ref <10)

## 2019-02-05 LAB — SARS CORONAVIRUS 2 BY RT PCR (HOSPITAL ORDER, PERFORMED IN ~~LOC~~ HOSPITAL LAB): SARS Coronavirus 2: NEGATIVE

## 2019-02-05 LAB — LIPASE, BLOOD: Lipase: 84 U/L — ABNORMAL HIGH (ref 11–51)

## 2019-02-05 NOTE — ED Triage Notes (Signed)
EMS pt to rm [address] from [address]. Pt Dx'd with liver failure a few weeks ago. Reports worse swelling to his legs and abd over the last few days

## 2019-02-05 NOTE — ED Provider Notes (Signed)
[facility] Emergency Department Provider Note  ____________________________________________   I have reviewed the triage vital signs and the nursing notes. Where available I have reviewed prior notes and, if possible and indicated, outside hospital notes.    HISTORY  Chief Complaint Abdominal Pain and Leg Swelling    HPI Frederick Cabrera is a 53 y.o. male   Patient seen and evaluated during the coronavirus epidemic during a time with low staffing.  Patient here with tense ascites.  He has a long history of ascites, has been drained.  He states has been a gradual reaccumulation of his ascites over the last several weeks.  He denies any chest pain, he does feel short of breath when he lies flat because of the ascites.  He has no fever no chills no nausea no vomiting no diarrhea.  No bleeding this time.  His lower extremities are also a bit swollen.  Past Medical History:  Diagnosis Date  . Liver failure [facility])     Patient Active Problem List   Diagnosis Date Noted  . Alcoholic cirrhosis of liver with ascites (HCC)   . Goals of care, counseling/discussion   . Palliative care by specialist   . Hematemesis 01/17/2019    Past Surgical History:  Procedure Laterality Date  . ESOPHAGOGASTRODUODENOSCOPY (EGD) WITH PROPOFOL N/A 01/17/2019   Procedure: ESOPHAGOGASTRODUODENOSCOPY (EGD) WITH PROPOFOL;  Surgeon: Toledo, Boykin Nearing, MD;  Location: [facility] ENDOSCOPY;  Service: Gastroenterology;  Laterality: N/A;    Prior to Admission medications   Medication Sig Start Date End Date Taking? Authorizing Provider  folic acid (FOLVITE) 1 MG tablet Take 1 mg by mouth daily. 11/22/18 11/22/19  [provider]  lactulose (CHRONULAC) 10 GM/15ML solution Take 45 mLs (30 g total) by mouth daily. 01/23/19   Enid Baas, MD  nadolol (CORGARD) 20 MG tablet Take 1 tablet (20 mg total) by mouth daily. 01/23/19   Enid Baas, MD  nicotine (NICODERM CQ - DOSED IN MG/24  HOURS) 21 mg/24hr patch Place 1 patch (21 mg total) onto the skin daily. 01/23/19   Adrian Saran, MD  pantoprazole (PROTONIX) 40 MG tablet Take 1 tablet (40 mg total) by mouth 2 (two) times daily before a meal. 01/23/19   Enid Baas, MD  spironolactone (ALDACTONE) 25 MG tablet Take 1 tablet (25 mg total) by mouth daily. 01/23/19   Enid Baas, MD  thiamine 100 MG tablet Take 200 mg by mouth daily. 11/22/18   [provider]    Allergies Patient has no known allergies.  History reviewed. No pertinent family history.  Social History Social History   Tobacco Use  . Smoking status: Current Every Day Smoker  . Smokeless tobacco: Never Used  Substance Use Topics  . Alcohol use: Yes  . Drug use: Not Currently    Review of Systems Constitutional: No fever/chills Eyes: No visual changes. ENT: No sore throat. No stiff neck no neck pain Cardiovascular: Denies chest pain. Respiratory: Denies shortness of breath. Gastrointestinal:   no vomiting.  No diarrhea.  No constipation. Genitourinary: Negative for dysuria. Musculoskeletal: Negative lower extremity swelling Skin: Negative for rash. Neurological: Negative for severe headaches, focal weakness or numbness.   ____________________________________________   PHYSICAL EXAM:  VITAL SIGNS: ED Triage Vitals  Enc Vitals Group     BP 02/05/19 2156 (!) 148/83     Pulse Rate 02/05/19 2156 86     Resp 02/05/19 2156 18     Temp 02/05/19 2156 98.1 F (36.7 C)  Temp Source 02/05/19 2156 Oral     SpO2 02/05/19 2156 97 %     Weight 02/05/19 2158 214 lb 11.7 oz (97.4 kg)     Height 02/05/19 2158 5\' 5"  (1.651 m)     Head Circumference --      Peak Flow --      Pain Score 02/05/19 2157 8     Pain Loc --      Pain Edu? --      Excl. in GC? --     Constitutional: Alert and oriented. Well appearing and in no acute distress. Eyes: Conjunctivae are normal Head: Atraumatic HEENT: No congestion/rhinnorhea. Mucous membranes  are moist.  Oropharynx non-erythematous Neck:   Nontender with no meningismus, no masses, no stridor Cardiovascular: Normal rate, regular rhythm. Grossly normal heart sounds.  Good peripheral circulation. Respiratory: Normal respiratory effort.  No retractions. Lungs CTAB. Abdominal: Tense nontender ascites which is quite pronounced.  No distention. No guarding no rebound Back:  There is no focal tenderness or step off.  there is no midline tenderness there are no lesions noted. there is no CVA tenderness Musculoskeletal: No lower extremity tenderness, no upper extremity tenderness. No joint effusions, no DVT signs strong distal pulses no edema Neurologic:  Normal speech and language. No gross focal neurologic deficits are appreciated.  Skin:  Skin is warm, dry and intact. No rash noted. Psychiatric: Mood and affect are normal. Speech and behavior are normal.  ____________________________________________   LABS (all labs ordered are listed, but only abnormal results are displayed)  Labs Reviewed  SARS CORONAVIRUS 2 (HOSPITAL ORDER, PERFORMED IN [place] [facility])  LIPASE, BLOOD  COMPREHENSIVE METABOLIC PANEL  CBC  URINALYSIS, COMPLETE (UACMP) WITH MICROSCOPIC  ETHANOL  PROTIME-INR    Pertinent labs  results that were available during my care of the patient were reviewed by me and considered in my medical decision making (see chart for details). ____________________________________________  EKG  I personally interpreted any EKGs ordered by me or triage  ____________________________________________  RADIOLOGY  Pertinent labs & imaging results that were available during my care of the patient were reviewed by me and considered in my medical decision making (see chart for details). If possible, patient and/or family made aware of any abnormal findings.  No results found. ____________________________________________    PROCEDURES  Procedure(s) performed:  None  Procedures  Critical Care performed: None  ____________________________________________   INITIAL IMPRESSION / ASSESSMENT AND PLAN / ED COURSE  Pertinent labs & imaging results that were available during my care of the patient were reviewed by me and considered in my medical decision making (see chart for details).  Patient is awake and alert no evidence of hepatic encephalopathy however he has a tense ascites that will likely need to be drained.  This will probably make him feel better we will check basic blood work including coags, and we will admit him for further evaluation.  The optimal thing would be if this could be arranged as an outpatient but we certainly can do that the middle of the night on a weekend.  Signed out to Dr. Anne HahnWillis at the end of my shift who agrees with management will admit patient.    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  Despite best efforts to proofread,  errors can occur which can change meaning.  Jeanmarie Plant, MD 02/05/19 2240

## 2019-02-05 NOTE — ED Notes (Signed)
ED TO INPATIENT HANDOFF REPORT  ED Nurse Name and Phone #: Dewayne Hatchnn [phone]  S Name/Age/Gender Frederick Cabrera 53 y.o. male Room/Bed: ED19A/ED19A  Code Status   Code Status: Prior  Home/SNF/Other Home Patient oriented to: self, place, time and situation Is this baseline? Yes   Triage Complete: Triage complete  Chief Complaint SOB  Triage Note EMS pt to rm 19 from side of the road. Pt Dx'd with liver failure a few weeks ago. Reports worse swelling to his legs and abd over the last few days   Allergies Allergies  Allergen Reactions  . Wool Alcohol [Lanolin] Rash    Level of Care/Admitting Diagnosis ED Disposition    ED Disposition Condition Comment   Admit  Hospital Area: [facility] [[id number]]  Level of Care: Med-Surg [16]  Covid Evaluation: Screening Protocol (No Symptoms)  Diagnosis: Alcoholic cirrhosis of liver with ascites [facility](HCC) [[id number]]) [[id number]]  Admitting Physician: Oralia ManisWILLIS, DAVID [[id number]]  Attending Physician: Oralia ManisWILLIS, DAVID [[id number]]  PT Class (Do Not Modify): Observation [104]  PT Acc Code (Do Not Modify): Observation [10022]       B Medical/Surgery History Past Medical History:  Diagnosis Date  . Alcoholic cirrhosis of liver with ascites [facility](HCC)    Past Surgical History:  Procedure Laterality Date  . ESOPHAGOGASTRODUODENOSCOPY (EGD) WITH PROPOFOL N/A 01/17/2019   Procedure: ESOPHAGOGASTRODUODENOSCOPY (EGD) WITH PROPOFOL;  Surgeon: Toledo, Boykin Nearingeodoro K, MD;  Location: [facility] ENDOSCOPY;  Service: Gastroenterology;  Laterality: N/A;     A IV Location/Drains/Wounds Patient Lines/Drains/Airways Status   Active Line/Drains/Airways    Name:   Placement date:   Placement time:   Site:   Days:   Peripheral IV 02/05/19 Left Antecubital   02/05/19    2212    Antecubital   less than 1   Wound / Incision (Open or Dehisced) 01/18/19 Puncture Abdomen Lower;Right Paracentesis site   01/18/19    2000    Abdomen   18          Intake/Output Last 24 hours No  intake or output data in the 24 hours ending 02/05/19 2331  Labs/Imaging Results for orders placed or performed during the hospital encounter of 02/05/19 (from the past 48 hour(s))  Lipase, blood     Status: Abnormal   Collection Time: 02/05/19 10:22 PM  Result Value Ref Range   Lipase 84 (H) 11 - 51 U/L    Comment: Performed at [facility], [address] [place], [place] [address]  Comprehensive metabolic panel     Status: Abnormal   Collection Time: 02/05/19 10:22 PM  Result Value Ref Range   Sodium 127 (L) 135 - 145 mmol/L   Potassium 4.0 3.5 - 5.1 mmol/L   Chloride 100 98 - 111 mmol/L   CO2 20 (L) 22 - 32 mmol/L   Glucose, Bld 108 (H) 70 - 99 mg/dL   BUN 7 6 - 20 mg/dL   Creatinine, Ser 8.840.73 0.61 - 1.24 mg/dL   Calcium 7.8 (L) 8.9 - 10.3 mg/dL   Total Protein 7.1 6.5 - 8.1 g/dL   Albumin 2.2 (L) 3.5 - 5.0 g/dL   AST 68 (H) 15 - 41 U/L   ALT 28 0 - 44 U/L   Alkaline Phosphatase 138 (H) 38 - 126 U/L   Total Bilirubin 5.1 (H) 0.3 - 1.2 mg/dL   GFR calc non Af Amer >60 >60 mL/min   GFR calc Af Amer >60 >60 mL/min   Anion gap 7 5 - 15  Comment: Performed at [facility], [address] [place], [place] [address]  CBC     Status: Abnormal   Collection Time: 02/05/19 10:22 PM  Result Value Ref Range   WBC 8.5 4.0 - 10.5 K/uL   RBC 3.05 (L) 4.22 - 5.81 MIL/uL   Hemoglobin 10.8 (L) 13.0 - 17.0 g/dL   HCT 10.2 (L) 58.5 - 27.7 %   MCV 99.3 80.0 - 100.0 fL   MCH 35.4 (H) 26.0 - 34.0 pg   MCHC 35.6 30.0 - 36.0 g/dL   RDW 82.4 23.5 - 36.1 %   Platelets 112 (L) 150 - 400 K/uL    Comment: Immature Platelet Fraction may be clinically indicated, consider ordering this additional test [id number]    nRBC 0.0 0.0 - 0.2 %    Comment: Performed at [facility], [address] [place], [place] [address]  Ethanol     Status: None   Collection Time: 02/05/19 10:22 PM  Result Value Ref Range   Alcohol, Ethyl (B) <10 <10 mg/dL    Comment: (NOTE) Lowest  detectable limit for serum alcohol is 10 mg/dL. For medical purposes only. Performed at [facility], [address] [place], [place] [address]   Protime-INR     Status: Abnormal   Collection Time: 02/05/19 10:49 PM  Result Value Ref Range   Prothrombin Time 20.2 (H) 11.4 - 15.2 seconds   INR 1.8 (H) 0.8 - 1.2    Comment: (NOTE) INR goal varies based on device and disease states. Performed at [facility], [address] [place], [place] [address]    No results found.  Pending Labs Unresulted Labs (From admission, onward)    Start     Ordered   02/05/19 2223  SARS Coronavirus 2 (CEPHEID - Performed in [facility] hospital lab), Hosp Order  (Asymptomatic Patients Labs)  Once,   STAT    Question:  Rule Out  Answer:  Yes   02/05/19 2223   02/05/19 2222  Urinalysis, Complete w Microscopic  ONCE - STAT,   STAT     02/05/19 2222   Signed and Held  Basic metabolic panel  Tomorrow morning,   R     Signed and Held   Signed and Held  CBC  Tomorrow morning,   R     Signed and Held          Vitals/Pain Today's Vitals   02/05/19 2156 02/05/19 2157 02/05/19 2158  BP: (!) 148/83    Pulse: 86    Resp: 18    Temp: 98.1 F (36.7 C)    TempSrc: Oral    SpO2: 97%    Weight:   97.4 kg  Height:   5\' 5"  (1.651 m)  PainSc:  8      Isolation Precautions No active isolations  Medications Medications - No data to display  Mobility walks with device High fall risk   Focused Assessments na   R Recommendations: See Admitting Provider Note  Report given to:   Additional Notes: na

## 2019-02-05 NOTE — H&P (Signed)
[facility] [place] at [facility]   PATIENT NAME: Frederick Cabrera    MR#:  [id number]  DATE OF BIRTH:  15-Aug-1966  DATE OF ADMISSION:  02/05/2019  PRIMARY CARE PHYSICIAN: Patient, No Pcp Per   REQUESTING/REFERRING PHYSICIAN: Alphonzo Lemmings, MD  CHIEF COMPLAINT:   Chief Complaint  Patient presents with  . Abdominal Pain  . Leg Swelling    HISTORY OF PRESENT ILLNESS:  Frederick Cabrera  is a 53 y.o. male who presents with chief complaint as above.  Patient presents the ED with increased abdominal swelling.  He was recently admitted here with cirrhosis with ascites and had paracentesis performed.  He comes back tonight with abdominal symptoms as above.  Work-up is largely reassuring, with some chronic hyponatremia, and elevated INR due to his liver failure.  He likely needs another paracentesis.  Hospitalist called for admission  PAST MEDICAL HISTORY:   Past Medical History:  Diagnosis Date  . Alcoholic cirrhosis of liver with ascites (HCC)      PAST SURGICAL HISTORY:   Past Surgical History:  Procedure Laterality Date  . ESOPHAGOGASTRODUODENOSCOPY (EGD) WITH PROPOFOL N/A 01/17/2019   Procedure: ESOPHAGOGASTRODUODENOSCOPY (EGD) WITH PROPOFOL;  Surgeon: Toledo, Boykin Nearing, MD;  Location: [facility] ENDOSCOPY;  Service: Gastroenterology;  Laterality: N/A;     SOCIAL HISTORY:   Social History   Tobacco Use  . Smoking status: Current Every Day Smoker  . Smokeless tobacco: Never Used  Substance Use Topics  . Alcohol use: Yes     FAMILY HISTORY:    Family history reviewed and is non-contributory DRUG ALLERGIES:   Allergies  Allergen Reactions  . Wool Alcohol [Lanolin] Rash    MEDICATIONS AT HOME:   Prior to Admission medications   Medication Sig Start Date End Date Taking? Authorizing Provider  folic acid (FOLVITE) 1 MG tablet Take 1 mg by mouth daily. 11/22/18 11/22/19 Yes [provider]  lactulose (CHRONULAC) 10 GM/15ML solution Take 45 mLs  (30 g total) by mouth daily. 01/23/19  Yes Enid Baas, MD  nadolol (CORGARD) 20 MG tablet Take 1 tablet (20 mg total) by mouth daily. 01/23/19  Yes Enid Baas, MD  nicotine (NICODERM CQ - DOSED IN MG/24 HOURS) 21 mg/24hr patch Place 1 patch (21 mg total) onto the skin daily. 01/23/19  Yes Adrian Saran, MD  pantoprazole (PROTONIX) 40 MG tablet Take 1 tablet (40 mg total) by mouth 2 (two) times daily before a meal. 01/23/19  Yes Enid Baas, MD  spironolactone (ALDACTONE) 25 MG tablet Take 1 tablet (25 mg total) by mouth daily. 01/23/19  Yes Enid Baas, MD  thiamine 100 MG tablet Take 200 mg by mouth daily. 11/22/18  Yes [provider]    REVIEW OF SYSTEMS:  Review of Systems  Constitutional: Negative for chills, fever, malaise/fatigue and weight loss.  HENT: Negative for ear pain, hearing loss and tinnitus.   Eyes: Negative for blurred vision, double vision, pain and redness.  Respiratory: Negative for cough, hemoptysis and shortness of breath.   Cardiovascular: Negative for chest pain, palpitations, orthopnea and leg swelling.  Gastrointestinal: Positive for abdominal pain. Negative for constipation, diarrhea, nausea and vomiting.  Genitourinary: Negative for dysuria, frequency and hematuria.  Musculoskeletal: Negative for back pain, joint pain and neck pain.  Skin:       No acne, rash, or lesions  Neurological: Negative for dizziness, tremors, focal weakness and weakness.  Endo/Heme/Allergies: Negative for polydipsia. Does not bruise/bleed easily.  Psychiatric/Behavioral: Negative for depression. The patient is not  nervous/anxious and does not have insomnia.      VITAL SIGNS:   Vitals:   02/05/19 2156 02/05/19 2158  BP: (!) 148/83   Pulse: 86   Resp: 18   Temp: 98.1 F (36.7 C)   TempSrc: Oral   SpO2: 97%   Weight:  97.4 kg  Height:  5\' 5"  (1.651 m)   Wt Readings from Last 3 Encounters:  02/05/19 97.4 kg  01/17/19 97.4 kg    PHYSICAL  EXAMINATION:  Physical Exam  Vitals reviewed. Constitutional: He is oriented to person, place, and time. He appears well-developed and well-nourished. No distress.  HENT:  Head: Normocephalic and atraumatic.  Mouth/Throat: Oropharynx is clear and moist.  Eyes: Pupils are equal, round, and reactive to light. Conjunctivae and EOM are normal. No scleral icterus.  Neck: Normal range of motion. Neck supple. No JVD present. No thyromegaly present.  Cardiovascular: Normal rate, regular rhythm and intact distal pulses. Exam reveals no gallop and no friction rub.  No murmur heard. Respiratory: Effort normal and breath sounds normal. No respiratory distress. He has no wheezes. He has no rales.  GI: Soft. Bowel sounds are normal. He exhibits distension. There is abdominal tenderness.  Musculoskeletal: Normal range of motion.        General: No edema.     Comments: No arthritis, no gout  Lymphadenopathy:    He has no cervical adenopathy.  Neurological: He is alert and oriented to person, place, and time. No cranial nerve deficit.  No dysarthria, no aphasia  Skin: Skin is warm and dry. No rash noted. No erythema.  Psychiatric: He has a normal mood and affect. His behavior is normal. Judgment and thought content normal.    LABORATORY PANEL:   CBC Recent Labs  Lab 02/05/19 2222  WBC 8.5  HGB 10.8*  HCT 30.3*  PLT 112*   ------------------------------------------------------------------------------------------------------------------  Chemistries  Recent Labs  Lab 02/05/19 2222  NA 127*  K 4.0  CL 100  CO2 20*  GLUCOSE 108*  BUN 7  CREATININE 0.73  CALCIUM 7.8*  AST 68*  ALT 28  ALKPHOS 138*  BILITOT 5.1*   ------------------------------------------------------------------------------------------------------------------  Cardiac Enzymes No results for input(s): TROPONINI in the last 168  hours. ------------------------------------------------------------------------------------------------------------------  RADIOLOGY:  No results found.  EKG:   Orders placed or performed during the hospital encounter of 02/05/19  . ED EKG  . ED EKG  . EKG 12-Lead  . EKG 12-Lead    IMPRESSION AND PLAN:  Principal Problem:   Alcoholic cirrhosis of liver with ascites (HCC) -GI consult for paracentesis, continue home meds   Hyponatremia -chronic.  His value tonight is a little bit lower than his baseline of low 130s.  He is at [employer]127 tonight.  He states he has been drinking a lot of lemon water, trying to "flush his system"  Chart review performed and case discussed with ED provider. Labs, imaging and/or ECG reviewed by provider and discussed with patient/family. Management plans discussed with the patient and/or family.  COVID-19 status: Test pending  DVT PROPHYLAXIS: Mechanical only  GI PROPHYLAXIS:  PPI   ADMISSION STATUS: Observation  CODE STATUS: Full Code Status History    Date Active Date Inactive Code Status Order ID Comments User Context   01/17/2019 0930 01/23/2019 1905 Full Code [id number]  Ramonita LabGouru, Aruna, MD ED   01/17/2019 0810 01/17/2019 0930 Full Code [id number]  Nita SickleVeronese, [place], MD ED      TOTAL TIME TAKING CARE OF THIS PATIENT: 40 minutes.  This patient was evaluated in the context of the global COVID-19 pandemic, which necessitated consideration that the patient might be at risk for infection with the SARS-CoV-2 virus that causes COVID-19. Institutional protocols and algorithms that pertain to the evaluation of patients at risk for COVID-19 are in a state of rapid change based on information released by regulatory bodies including the CDC and federal and state organizations. These policies and algorithms were followed to the best of this provider's knowledge to date during the patient's care at this facility.  Barney Drain 02/05/2019, 11:29 PM  [facility]  Office  [phone]  CC: Primary care physician; Patient, No Pcp Per  Note:  This document was prepared using Dragon voice recognition software and may include unintentional dictation errors.

## 2019-02-06 ENCOUNTER — Observation Stay: Payer: Medicaid Other

## 2019-02-06 ENCOUNTER — Encounter: Payer: Self-pay | Admitting: *Deleted

## 2019-02-06 LAB — URINALYSIS, COMPLETE (UACMP) WITH MICROSCOPIC
Bacteria, UA: NONE SEEN
Glucose, UA: NEGATIVE mg/dL
Hgb urine dipstick: NEGATIVE
Ketones, ur: 5 mg/dL — AB
Leukocytes,Ua: NEGATIVE
Nitrite: NEGATIVE
Protein, ur: NEGATIVE mg/dL
Specific Gravity, Urine: 1.025 (ref 1.005–1.030)
pH: 6 (ref 5.0–8.0)

## 2019-02-06 LAB — BODY FLUID CELL COUNT WITH DIFFERENTIAL
Eos, Fluid: 0 %
Lymphs, Fluid: 25 %
Monocyte-Macrophage-Serous Fluid: 67 %
Neutrophil Count, Fluid: 8 %
Total Nucleated Cell Count, Fluid: 159 cu mm

## 2019-02-06 LAB — BASIC METABOLIC PANEL
Anion gap: 4 — ABNORMAL LOW (ref 5–15)
BUN: 7 mg/dL (ref 6–20)
CO2: 22 mmol/L (ref 22–32)
Calcium: 7.7 mg/dL — ABNORMAL LOW (ref 8.9–10.3)
Chloride: 102 mmol/L (ref 98–111)
Creatinine, Ser: 0.69 mg/dL (ref 0.61–1.24)
GFR calc Af Amer: >60 mL/min (ref >60)
GFR calc non Af Amer: >60 mL/min (ref >60)
Glucose, Bld: 116 mg/dL — ABNORMAL HIGH (ref 70–99)
Potassium: 3.7 mmol/L (ref 3.5–5.1)
Sodium: 128 mmol/L — ABNORMAL LOW (ref 135–145)

## 2019-02-06 LAB — CBC
HCT: 28.7 % — ABNORMAL LOW (ref 39.0–52.0)
Hemoglobin: 10 g/dL — ABNORMAL LOW (ref 13.0–17.0)
MCH: 35.1 pg — ABNORMAL HIGH (ref 26.0–34.0)
MCHC: 34.8 g/dL (ref 30.0–36.0)
MCV: 100.7 fL — ABNORMAL HIGH (ref 80.0–100.0)
Platelets: 89 10*3/uL — ABNORMAL LOW (ref 150–400)
RBC: 2.85 MIL/uL — ABNORMAL LOW (ref 4.22–5.81)
RDW: 15.5 % (ref 11.5–15.5)
WBC: 9 10*3/uL (ref 4.0–10.5)
nRBC: 0.9 % — ABNORMAL HIGH (ref 0.0–0.2)

## 2019-02-06 LAB — LACTATE DEHYDROGENASE, PLEURAL OR PERITONEAL FLUID: LD, Fluid: 45 U/L — ABNORMAL HIGH (ref 3–23)

## 2019-02-06 LAB — GLUCOSE, PLEURAL OR PERITONEAL FLUID: Glucose, Fluid: 118 mg/dL

## 2019-02-06 LAB — BILIRUBIN, TOTAL: Total Bilirubin: 4.9 mg/dL — ABNORMAL HIGH (ref 0.3–1.2)

## 2019-02-06 LAB — PROTEIN, PLEURAL OR PERITONEAL FLUID: Total protein, fluid: <3 g/dL

## 2019-02-06 LAB — ALBUMIN, PLEURAL OR PERITONEAL FLUID: Albumin, Fluid: <1 g/dL

## 2019-02-06 MED ORDER — PANTOPRAZOLE SODIUM 40 MG PO TBEC
40.0000 mg | DELAYED_RELEASE_TABLET | Freq: Two times a day (BID) | ORAL | Status: DC
  Administered 2019-02-06: 40 mg via ORAL
  Filled 2019-02-06: qty 1

## 2019-02-06 MED ORDER — SODIUM CHLORIDE 0.9% FLUSH
3.0000 mL | INTRAVENOUS | Status: DC | PRN
  Administered 2019-02-06: 3 mL via INTRAVENOUS
  Filled 2019-02-06: qty 3

## 2019-02-06 MED ORDER — SPIRONOLACTONE 25 MG PO TABS
25.0000 mg | ORAL_TABLET | Freq: Every day | ORAL | Status: DC
  Administered 2019-02-06: 08:00:00 25 mg via ORAL
  Filled 2019-02-06: qty 1

## 2019-02-06 MED ORDER — LACTULOSE 10 GM/15ML PO SOLN
30.0000 g | Freq: Every day | ORAL | Status: DC
  Administered 2019-02-06: 30 g via ORAL
  Filled 2019-02-06: qty 60

## 2019-02-06 MED ORDER — ONDANSETRON HCL 4 MG/2ML IJ SOLN: 4.0000 mg | Freq: Four times a day (QID) | INTRAMUSCULAR | Status: DC | PRN

## 2019-02-06 MED ORDER — ONDANSETRON HCL 4 MG PO TABS: 4.0000 mg | ORAL_TABLET | Freq: Four times a day (QID) | ORAL | Status: DC | PRN

## 2019-02-06 MED ORDER — OXYCODONE HCL 5 MG PO TABS
5.0000 mg | ORAL_TABLET | Freq: Four times a day (QID) | ORAL | Status: DC | PRN
  Administered 2019-02-06: 08:00:00 5 mg via ORAL
  Filled 2019-02-06: qty 1

## 2019-02-06 MED ORDER — NADOLOL 20 MG PO TABS
20.0000 mg | ORAL_TABLET | Freq: Every day | ORAL | Status: DC
  Administered 2019-02-06: 20 mg via ORAL
  Filled 2019-02-06: qty 1

## 2019-02-06 MED ORDER — VITAMIN B-1 100 MG PO TABS
200.0000 mg | ORAL_TABLET | Freq: Every day | ORAL | Status: DC
  Administered 2019-02-06: 200 mg via ORAL
  Filled 2019-02-06: qty 2

## 2019-02-06 MED ORDER — NICOTINE 14 MG/24HR TD PT24
14.0000 mg | MEDICATED_PATCH | Freq: Every day | TRANSDERMAL | Status: DC
  Administered 2019-02-06: 14 mg via TRANSDERMAL
  Filled 2019-02-06: qty 1

## 2019-02-06 MED ORDER — FOLIC ACID 1 MG PO TABS
1.0000 mg | ORAL_TABLET | Freq: Every day | ORAL | Status: DC
  Administered 2019-02-06: 1 mg via ORAL
  Filled 2019-02-06: qty 1

## 2019-02-06 MED ORDER — ALBUMIN HUMAN 25 % IV SOLN
12.5000 g | Freq: Once | INTRAVENOUS | Status: AC
  Administered 2019-02-06: 12.5 g via INTRAVENOUS
  Filled 2019-02-06: qty 50

## 2019-02-06 NOTE — Procedures (Signed)
Pre Procedural Dx: Symptomatic Ascites Post Procedural Dx: Same  Successful US guided paracentesis yielding 5 L of serous ascitic fluid. Sample sent to laboratory as requested.  EBL: None  Complications: None immediate  Jay Greyson Riccardi, MD Pager #: [phone]   

## 2019-02-06 NOTE — Discharge Instructions (Signed)
Alcoholic Liver Disease  Alcoholic liver disease is liver damage that is caused by drinking a lot of alcohol for a long time. If you have this disease, you must stop drinking alcohol. Follow these instructions at home:   Do not drink alcohol. Follow your treatment plan. Work with your doctor if you need help.  Think about joining an alcohol support group.  Take over-the-counter and prescription medicines only as told by your doctor. These include vitamins.  Do not use medicines or eat foods that have alcohol in them, unless your doctor says that it is safe.  Follow instructions from your doctor about eating a healthy diet.  Keep all follow-up visits as told by your doctor. This is important. Contact a doctor if:  You get a fever.  Your skin starts to look more yellow, pale, or dark.  You get headaches. Get help right away if:  You throw up (vomit) blood.  You have bright red blood in your poop (stool).  Your poop looks black or looks like tar.  You have trouble: ? Thinking. ? Walking. ? Balancing. ? Breathing. Summary  Alcoholic liver disease is liver damage that is caused by drinking a lot of alcohol for a long time.  If you have this disease, you must stop drinking alcohol. Follow your treatment plan, and work with your doctor as needed.  Think about joining an alcohol support group. This information is not intended to replace advice given to you by your health care provider. Make sure you discuss any questions you have with your health care provider. Document Released: 07/07/2009 Document Revised: 05/30/2017 Document Reviewed: 05/30/2017 Elsevier Interactive Patient Education  2019 Elsevier Inc.   Ascites  Ascites is a collection of too much fluid in the abdomen. Ascites can range from mild to severe. If ascites is not treated, it can get worse. What are the causes? This condition may be caused by:  A liver condition called cirrhosis. This is the most  common cause of ascites.  Long-term (chronic) or alcoholic hepatitis.  Infection or inflammation in the abdomen.  Cancer in the abdomen.  Heart failure.  Kidney disease.  Inflammation of the pancreas.  Clots in the veins of the liver. What are the signs or symptoms? Symptoms of this condition include:  A feeling of fullness in the abdomen. This is common.  An increase in the size of the abdomen or waist.  Swelling in the legs.  Swelling of the scrotum (in men).  Difficulty breathing.  Pain in the abdomen.  Sudden weight gain. If the condition is mild, you may not have symptoms. How is this diagnosed? This condition is diagnosed based on your medical history and a physical exam. Your health care provider may order imaging tests, such as an ultrasound or CT scan of your abdomen. How is this treated? Treatment for this condition depends on the cause of the ascites. It may include:  Taking a pill to make you urinate. This is called a water pill (diuretic pill).  Strictly reducing your salt (sodium) intake. Salt can cause extra fluid to be kept (retained) in the body, and this makes ascites worse.  Having a procedure to remove fluid from your abdomen (paracentesis).  Having a procedure that connects two of the major veins within your liver and relieves pressure on your liver. This is called a TIPS procedure (transjugular intrahepatic portosystemic shunt procedure).  Placement of a drainage catheter (peritoneovenous shunt) to manage the extra fluid in the abdomen. Ascites  may go away or improve when the condition that caused it is treated. Follow these instructions at home:  Keep track of your weight. To do this, weigh yourself at the same time every day and write down your weight.  Keep track of how much you drink and any changes in how much or how often you urinate.  Follow any instructions that your health care provider gives you about how much to drink.  Try not  to eat salty (high-sodium) foods.  Take over-the-counter and prescription medicines only as told by your health care provider.  Keep all follow-up visits as told by your health care provider. This is important.  Report any changes in your health to your health care provider, especially if you develop new symptoms or your symptoms get worse. Contact a health care provider if:  You gain more than 3 lb (1.36 kg) in 3 days.  Your waist size increases.  You have new swelling in your legs.  The swelling in your legs gets worse. Get help right away if:  You have a fever.  You are confused.  You have new or worsening breathing trouble.  You have new or worsening pain in your abdomen.  You have new or worsening swelling in the scrotum (in men). Summary  Ascites is a collection of too much fluid in the abdomen.  Ascites may be caused by various conditions, such as cirrhosis, hepatitis, cancer, or congestive heart failure.  Symptoms may include swelling of the abdomen and other areas due to extra fluid in the body.  Treatments may involve dietary changes, medicines, or procedures. This information is not intended to replace advice given to you by your health care provider. Make sure you discuss any questions you have with your health care provider. Document Released: 09/09/2005 Document Revised: 05/22/2017 Document Reviewed: 05/22/2017 Elsevier Interactive Patient Education  2019 Elsevier Inc.   Ascites  Ascites is a collection of too much fluid in the abdomen. Ascites can range from mild to severe. If ascites is not treated, it can get worse. What are the causes? This condition may be caused by:  A liver condition called cirrhosis. This is the most common cause of ascites.  Long-term (chronic) or alcoholic hepatitis.  Infection or inflammation in the abdomen.  Cancer in the abdomen.  Heart failure.  Kidney disease.  Inflammation of the pancreas.  Clots in the veins  of the liver. What are the signs or symptoms? Symptoms of this condition include:  A feeling of fullness in the abdomen. This is common.  An increase in the size of the abdomen or waist.  Swelling in the legs.  Swelling of the scrotum (in men).  Difficulty breathing.  Pain in the abdomen.  Sudden weight gain. If the condition is mild, you may not have symptoms. How is this diagnosed? This condition is diagnosed based on your medical history and a physical exam. Your health care provider may order imaging tests, such as an ultrasound or CT scan of your abdomen. How is this treated? Treatment for this condition depends on the cause of the ascites. It may include:  Taking a pill to make you urinate. This is called a water pill (diuretic pill).  Strictly reducing your salt (sodium) intake. Salt can cause extra fluid to be kept (retained) in the body, and this makes ascites worse.  Having a procedure to remove fluid from your abdomen (paracentesis).  Having a procedure that connects two of the major veins within your liver  and relieves pressure on your liver. This is called a TIPS procedure (transjugular intrahepatic portosystemic shunt procedure).  Placement of a drainage catheter (peritoneovenous shunt) to manage the extra fluid in the abdomen. Ascites may go away or improve when the condition that caused it is treated. Follow these instructions at home:  Keep track of your weight. To do this, weigh yourself at the same time every day and write down your weight.  Keep track of how much you drink and any changes in how much or how often you urinate.  Follow any instructions that your health care provider gives you about how much to drink.  Try not to eat salty (high-sodium) foods.  Take over-the-counter and prescription medicines only as told by your health care provider.  Keep all follow-up visits as told by your health care provider. This is important.  Report any changes  in your health to your health care provider, especially if you develop new symptoms or your symptoms get worse. Contact a health care provider if:  You gain more than 3 lb (1.36 kg) in 3 days.  Your waist size increases.  You have new swelling in your legs.  The swelling in your legs gets worse. Get help right away if:  You have a fever.  You are confused.  You have new or worsening breathing trouble.  You have new or worsening pain in your abdomen.  You have new or worsening swelling in the scrotum (in men). Summary  Ascites is a collection of too much fluid in the abdomen.  Ascites may be caused by various conditions, such as cirrhosis, hepatitis, cancer, or congestive heart failure.  Symptoms may include swelling of the abdomen and other areas due to extra fluid in the body.  Treatments may involve dietary changes, medicines, or procedures. This information is not intended to replace advice given to you by your health care provider. Make sure you discuss any questions you have with your health care provider. Document Released: 09/09/2005 Document Revised: 05/22/2017 Document Reviewed: 05/22/2017 Elsevier Interactive Patient Education  2019 [employer] Smoking cessation.

## 2019-02-06 NOTE — Progress Notes (Signed)
Oxycodone effective for abdominal pressure/tightness related to ascites/abd distension. Paracentesis performed with 5liters removed. VSS. Procedure site clean/dry/intact. Pt agreeable with discharge but has transportation issues. Care mgt consult submitted. GI to see pt on outpatient basis.

## 2019-02-06 NOTE — Progress Notes (Signed)
Pt has $100.00 bill and some ones. Unable to contact family. Pt requested taxi for transport home. [employer] [facility] contacted at [phone] for transport home self pay. Will discharge home to self/family care when his taxi arrives.

## 2019-02-06 NOTE — ED Notes (Signed)
Transport to floor room 132.AS 

## 2019-02-06 NOTE — Discharge Summary (Signed)
[facility] [place] [facility]   PATIENT NAME: Frederick CalamityRichard Rocks    MR#:  [id number]  DATE OF BIRTH:  05/07/1966  DATE OF ADMISSION:  02/05/2019   ADMITTING PHYSICIAN: Oralia Manisavid Willis, MD  DATE OF DISCHARGE: 02/06/2019  PRIMARY CARE PHYSICIAN: Patient, No Pcp Per   ADMISSION DIAGNOSIS:  Ascites due to alcoholic hepatitis [K70.11] DISCHARGE DIAGNOSIS:  Principal Problem:   Alcoholic cirrhosis of liver with ascites (HCC)  SECONDARY DIAGNOSIS:   Past Medical History:  Diagnosis Date  . Alcoholic cirrhosis of liver with ascites [facility](HCC)    HOSPITAL COURSE:   Alcoholic cirrhosis of liver with ascites (HCC)  He got paracentesis with 5 L fluid, sent to labs, continue home meds. Follow up as outpatient per Dr. Tobi BastosAnna. Abnormal LFT due to above.   Hyponatremia -chronic.  Fluid restriction advised. Tobacco abuse. Smoking cessation was counseled for 3-4 minutes. DISCHARGE CONDITIONS:  Stable, discharge home today. CONSULTS OBTAINED:   DRUG ALLERGIES:   Allergies  Allergen Reactions  . Wool Alcohol [Lanolin] Rash   DISCHARGE MEDICATIONS:   Allergies as of 02/06/2019      Reactions   Wool Alcohol [lanolin] Rash      Medication List    TAKE these medications   folic acid 1 MG tablet Commonly known as:  FOLVITE Take 1 mg by mouth daily.   lactulose 10 GM/15ML solution Commonly known as:  CHRONULAC Take 45 mLs (30 g total) by mouth daily.   nadolol 20 MG tablet Commonly known as:  CORGARD Take 1 tablet (20 mg total) by mouth daily.   nicotine 21 mg/24hr patch Commonly known as:  NICODERM CQ - dosed in mg/24 hours Place 1 patch (21 mg total) onto the skin daily.   pantoprazole 40 MG tablet Commonly known as:  PROTONIX Take 1 tablet (40 mg total) by mouth 2 (two) times daily before a meal.   spironolactone 25 MG tablet Commonly known as:  ALDACTONE Take 1 tablet (25 mg total) by mouth daily.   thiamine 100 MG tablet Take 200 mg by mouth daily.       DISCHARGE INSTRUCTIONS:  See AVS.  If you experience worsening of your admission symptoms, develop shortness of breath, life threatening emergency, suicidal or homicidal thoughts you must seek medical attention immediately by calling 911 or calling your MD immediately  if symptoms less severe.  You Must read complete instructions/literature along with all the possible adverse reactions/side effects for all the Medicines you take and that have been prescribed to you. Take any new Medicines after you have completely understood and accpet all the possible adverse reactions/side effects.   Please note  You were cared for by a hospitalist during your hospital stay. If you have any questions about your discharge medications or the care you received while you were in the hospital after you are discharged, you can call the unit and asked to speak with the hospitalist on call if the hospitalist that took care of you is not available. Once you are discharged, your primary care physician will handle any further medical issues. Please note that NO REFILLS for any discharge medications will be authorized once you are discharged, as it is imperative that you return to your primary care physician (or establish a relationship with a primary care physician if you do not have one) for your aftercare needs so that they can reassess your need for medications and monitor your lab values.    On the day of Discharge:  VITAL SIGNS:  Blood pressure 134/78, pulse 71, temperature 97.9 F (36.6 C), temperature source Oral, resp. rate 20, height  (1.651 m), weight 97.4 kg, SpO2 98 %. PHYSICAL EXAMINATION:  GENERAL:  53 y.o.-year-old patient lying in the bed with no acute distress.  EYES: Pupils equal, round, reactive to light and accommodation. No scleral icterus. Extraocular muscles intact.  HEENT: Head atraumatic, normocephalic. Oropharynx and nasopharynx clear.  NECK:  Supple, no jugular venous distention.  No thyroid enlargement, no tenderness.  LUNGS: Normal breath sounds bilaterally, no wheezing, rales,rhonchi or crepitation. No use of accessory muscles of respiration.  CARDIOVASCULAR: S1, S2 normal. No murmurs, rubs, or gallops.  ABDOMEN: Soft, non-tender, distended. Bowel sounds present. Unable to estimate organomegaly or mass.  EXTREMITIES: No cyanosis, or clubbing. Bilateral leg edema. NEUROLOGIC: Cranial nerves II through XII are intact. Muscle strength 5/5 in all extremities. Sensation intact. Gait not checked.  PSYCHIATRIC: The patient is alert and oriented x 3.  SKIN: No obvious rash, lesion, or ulcer.  DATA REVIEW:   CBC Recent Labs  Lab 02/06/19 0345  WBC 9.0  HGB 10.0*  HCT 28.7*  PLT 89*    Chemistries  Recent Labs  Lab 02/05/19 2222 02/06/19 0345 02/06/19 1143  NA 127* 128*  --   K 4.0 3.7  --   CL 100 102  --   CO2 20* 22  --   GLUCOSE 108* 116*  --   BUN 7 7  --   CREATININE 0.73 0.69  --   CALCIUM 7.8* 7.7*  --   AST 68*  --   --   ALT 28  --   --   ALKPHOS 138*  --   --   BILITOT 5.1*  --  4.9*     Microbiology Results  Results for orders placed or performed during the hospital encounter of 02/05/19  SARS Coronavirus 2 (CEPHEID - Performed in [facility] hospital lab), Hosp Order     Status: None   Collection Time: 02/05/19 10:49 PM  Result Value Ref Range Status   SARS Coronavirus 2 NEGATIVE NEGATIVE Final    Comment: (NOTE) If result is NEGATIVE SARS-CoV-2 target nucleic acids are NOT DETECTED. The SARS-CoV-2 RNA is generally detectable in upper and lower  respiratory specimens during the acute phase of infection. The lowest  concentration of SARS-CoV-2 viral copies this assay can detect is 250  copies / mL. A negative result does not preclude SARS-CoV-2 infection  and should not be used as the sole basis for treatment or other  patient management decisions.  A negative result may occur with  improper specimen collection / handling,  submission of specimen other  than nasopharyngeal swab, presence of viral mutation(s) within the  areas targeted by this assay, and inadequate number of viral copies  (<250 copies / mL). A negative result must be combined with clinical  observations, patient history, and epidemiological information. If result is POSITIVE SARS-CoV-2 target nucleic acids are DETECTED. The SARS-CoV-2 RNA is generally detectable in upper and lower  respiratory specimens dur ing the acute phase of infection.  Positive  results are indicative of active infection with SARS-CoV-2.  Clinical  correlation with patient history and other diagnostic information is  necessary to determine patient infection status.  Positive results do  not rule out bacterial infection or co-infection with other viruses. If result is PRESUMPTIVE POSTIVE SARS-CoV-2 nucleic acids MAY BE PRESENT.   A presumptive positive result was obtained on the submitted specimen  and confirmed on repeat testing.  While 2019 novel coronavirus  (SARS-CoV-2) nucleic acids may be present in the submitted sample  additional confirmatory testing may be necessary for epidemiological  and / or clinical management purposes  to differentiate between  SARS-CoV-2 and other Sarbecovirus currently known to infect humans.  If clinically indicated additional testing with an alternate test  methodology [phone]) is advised. The SARS-CoV-2 RNA is generally  detectable in upper and lower respiratory sp ecimens during the acute  phase of infection. The expected result is Negative. Fact Sheet for Patients:  BoilerBrush.com.cy Fact Sheet for Healthcare Providers: https://pope.com/ This test is not yet approved or cleared by the [place] FDA and has been authorized for detection and/or diagnosis of SARS-CoV-2 by FDA under an Emergency Use Authorization (EUA).  This EUA will remain in effect (meaning this test can be  used) for the duration of the COVID-19 declaration under Section 564(b)(1) of the Act, 21 U.S.C. section 360bbb-3(b)(1), unless the authorization is terminated or revoked sooner. Performed at [facility], [address] [place], [place] [address]     RADIOLOGY:  US Paracentesis  Result Date: 02/06/2019 INDICATION: History of alcoholic cirrhosis with recurrent symptomatic intra-abdominal ascites. Please perform ultrasound-guided paracentesis for diagnostic and therapeutic purposes. Max paracentesis volume of 5 L. EXAM: ULTRASOUND-GUIDED PARACENTESIS COMPARISON:  CT abdomen pelvis-01/19/2019; ultrasound-guided paracentesis-01/18/2019, yielding 5 L of peritoneal fluid. MEDICATIONS: None. COMPLICATIONS: None immediate. TECHNIQUE: Informed written consent was obtained from the patient after a discussion of the risks, benefits and alternatives to treatment. A timeout was performed prior to the initiation of the procedure. Initial ultrasound scanning demonstrates a large amount of ascites within the right lower abdominal quadrant. The right lower abdomen was prepped and draped in the usual sterile fashion. 1% lidocaine with epinephrine was used for local anesthesia. An ultrasound image was saved for documentation purposed. An 8 Fr Safe-T-Centesis catheter was introduced. The paracentesis was performed. The catheter was removed and a dressing was applied. The patient tolerated the procedure well without immediate post procedural complication. FINDINGS: A total of approximately 5 liters of serous fluid was removed. Samples were sent to the laboratory as requested by the clinical team. IMPRESSION: Successful ultrasound-guided paracentesis yielding 5 liters of peritoneal fluid. Electronically Signed   By: Simonne Come M.D.   On: 02/06/2019 12:28     Management plans discussed with the patient, family and they are in agreement.  CODE STATUS: Full Code   TOTAL TIME TAKING CARE OF THIS PATIENT: 26  minutes.    Shaune Pollack M.D on 02/06/2019 at 1:48 PM  Between 7am to 6pm - Pager - [phone]  After 6pm go to [email or web address] - Social research officer, government  [facility]  Office  [phone]  CC: Primary care physician; Patient, No Pcp Per   Note: This dictation was prepared with Dragon dictation along with smaller phrase technology. Any transcriptional errors that result from this process are unintentional.

## 2019-02-08 LAB — PROTEIN, BODY FLUID (OTHER): Total Protein, Body Fluid Other: 1 g/dL

## 2019-02-08 LAB — TRIGLYCERIDES, BODY FLUIDS: Triglycerides, Fluid: 46 mg/dL

## 2019-02-09 LAB — BODY FLUID CULTURE: Culture: NO GROWTH

## 2019-02-09 LAB — PH, BODY FLUID: pH, Body Fluid: 7.5

## 2019-02-09 LAB — LIPASE, FLUID: Lipase-Fluid: 61 U/L

## 2019-02-10 LAB — CYTOLOGY - NON PAP

## 2019-02-11 DIAGNOSIS — K766 Portal hypertension: Secondary | ICD-10-CM | POA: Insufficient documentation

## 2019-02-11 DIAGNOSIS — I8510 Secondary esophageal varices without bleeding: Secondary | ICD-10-CM | POA: Insufficient documentation

## 2019-02-11 DIAGNOSIS — G9340 Encephalopathy, unspecified: Secondary | ICD-10-CM | POA: Insufficient documentation

## 2019-02-12 ENCOUNTER — Other Ambulatory Visit: Payer: Self-pay | Admitting: Internal Medicine

## 2019-02-12 DIAGNOSIS — K7031 Alcoholic cirrhosis of liver with ascites: Secondary | ICD-10-CM

## 2019-02-13 ENCOUNTER — Inpatient Hospital Stay
Admission: EM | Admit: 2019-02-13 | Discharge: 2019-02-16 | DRG: 432 | Disposition: A | Discharge status: Home / Self Care | Payer: Medicaid Other | Attending: Internal Medicine | Admitting: Internal Medicine

## 2019-02-13 ENCOUNTER — Other Ambulatory Visit: Payer: Self-pay

## 2019-02-13 ENCOUNTER — Emergency Department: Payer: Medicaid Other

## 2019-02-13 ENCOUNTER — Encounter: Payer: Self-pay | Admitting: Emergency Medicine

## 2019-02-13 DIAGNOSIS — K766 Portal hypertension: Secondary | ICD-10-CM | POA: Diagnosis present

## 2019-02-13 DIAGNOSIS — Z20828 Contact with and (suspected) exposure to other viral communicable diseases: Secondary | ICD-10-CM | POA: Diagnosis present

## 2019-02-13 DIAGNOSIS — K429 Umbilical hernia without obstruction or gangrene: Secondary | ICD-10-CM | POA: Diagnosis present

## 2019-02-13 DIAGNOSIS — K3189 Other diseases of stomach and duodenum: Secondary | ICD-10-CM | POA: Diagnosis present

## 2019-02-13 DIAGNOSIS — Z8249 Family history of ischemic heart disease and other diseases of the circulatory system: Secondary | ICD-10-CM | POA: Diagnosis not present

## 2019-02-13 DIAGNOSIS — J810 Acute pulmonary edema: Secondary | ICD-10-CM

## 2019-02-13 DIAGNOSIS — R440 Auditory hallucinations: Secondary | ICD-10-CM | POA: Diagnosis present

## 2019-02-13 DIAGNOSIS — K7031 Alcoholic cirrhosis of liver with ascites: Secondary | ICD-10-CM | POA: Diagnosis present

## 2019-02-13 DIAGNOSIS — R441 Visual hallucinations: Secondary | ICD-10-CM | POA: Diagnosis present

## 2019-02-13 DIAGNOSIS — R0602 Shortness of breath: Secondary | ICD-10-CM

## 2019-02-13 DIAGNOSIS — I8510 Secondary esophageal varices without bleeding: Secondary | ICD-10-CM | POA: Diagnosis present

## 2019-02-13 DIAGNOSIS — K7290 Hepatic failure, unspecified without coma: Secondary | ICD-10-CM | POA: Diagnosis present

## 2019-02-13 DIAGNOSIS — R7989 Other specified abnormal findings of blood chemistry: Secondary | ICD-10-CM

## 2019-02-13 DIAGNOSIS — Z79899 Other long term (current) drug therapy: Secondary | ICD-10-CM | POA: Diagnosis not present

## 2019-02-13 DIAGNOSIS — R140 Abdominal distension (gaseous): Secondary | ICD-10-CM

## 2019-02-13 DIAGNOSIS — K210 Gastro-esophageal reflux disease with esophagitis: Secondary | ICD-10-CM | POA: Diagnosis present

## 2019-02-13 DIAGNOSIS — E871 Hypo-osmolality and hyponatremia: Secondary | ICD-10-CM | POA: Diagnosis present

## 2019-02-13 DIAGNOSIS — R109 Unspecified abdominal pain: Secondary | ICD-10-CM | POA: Diagnosis present

## 2019-02-13 DIAGNOSIS — R1900 Intra-abdominal and pelvic swelling, mass and lump, unspecified site: Secondary | ICD-10-CM

## 2019-02-13 DIAGNOSIS — F1010 Alcohol abuse, uncomplicated: Secondary | ICD-10-CM | POA: Diagnosis present

## 2019-02-13 DIAGNOSIS — I5033 Acute on chronic diastolic (congestive) heart failure: Secondary | ICD-10-CM | POA: Diagnosis present

## 2019-02-13 DIAGNOSIS — F17200 Nicotine dependence, unspecified, uncomplicated: Secondary | ICD-10-CM | POA: Diagnosis present

## 2019-02-13 LAB — CBC WITH DIFFERENTIAL/PLATELET
Abs Immature Granulocytes: 0.06 10*3/uL (ref 0.00–0.07)
Basophils Absolute: 0.1 10*3/uL (ref 0.0–0.1)
Basophils Relative: 2 %
Eosinophils Absolute: 0.3 10*3/uL (ref 0.0–0.5)
Eosinophils Relative: 3 %
HCT: 29.5 % — ABNORMAL LOW (ref 39.0–52.0)
Hemoglobin: 10.5 g/dL — ABNORMAL LOW (ref 13.0–17.0)
Immature Granulocytes: 1 %
Lymphocytes Relative: 20 %
Lymphs Abs: 1.8 10*3/uL (ref 0.7–4.0)
MCH: 35.4 pg — ABNORMAL HIGH (ref 26.0–34.0)
MCHC: 35.6 g/dL (ref 30.0–36.0)
MCV: 99.3 fL (ref 80.0–100.0)
Monocytes Absolute: 1.3 10*3/uL — ABNORMAL HIGH (ref 0.1–1.0)
Monocytes Relative: 14 %
Neutro Abs: 5.2 10*3/uL (ref 1.7–7.7)
Neutrophils Relative %: 60 %
Platelets: 91 10*3/uL — ABNORMAL LOW (ref 150–400)
RBC: 2.97 MIL/uL — ABNORMAL LOW (ref 4.22–5.81)
RDW: 15.2 % (ref 11.5–15.5)
WBC: 8.8 10*3/uL (ref 4.0–10.5)
nRBC: 0 % (ref 0.0–0.2)

## 2019-02-13 LAB — COMPREHENSIVE METABOLIC PANEL
ALT: 20 U/L (ref 0–44)
AST: 51 U/L — ABNORMAL HIGH (ref 15–41)
Albumin: 1.9 g/dL — ABNORMAL LOW (ref 3.5–5.0)
Alkaline Phosphatase: 103 U/L (ref 38–126)
Anion gap: 8 (ref 5–15)
BUN: 7 mg/dL (ref 6–20)
CO2: 21 mmol/L — ABNORMAL LOW (ref 22–32)
Calcium: 7.9 mg/dL — ABNORMAL LOW (ref 8.9–10.3)
Chloride: 100 mmol/L (ref 98–111)
Creatinine, Ser: 0.75 mg/dL (ref 0.61–1.24)
GFR calc Af Amer: >60 mL/min (ref >60)
GFR calc non Af Amer: >60 mL/min (ref >60)
Glucose, Bld: 134 mg/dL — ABNORMAL HIGH (ref 70–99)
Potassium: 3.7 mmol/L (ref 3.5–5.1)
Sodium: 129 mmol/L — ABNORMAL LOW (ref 135–145)
Total Bilirubin: 4.6 mg/dL — ABNORMAL HIGH (ref 0.3–1.2)
Total Protein: 6.5 g/dL (ref 6.5–8.1)

## 2019-02-13 LAB — SARS CORONAVIRUS 2 BY RT PCR (HOSPITAL ORDER, PERFORMED IN ~~LOC~~ HOSPITAL LAB): SARS Coronavirus 2: NEGATIVE

## 2019-02-13 LAB — TROPONIN I: Troponin I: <0.03 ng/mL (ref <0.03)

## 2019-02-13 LAB — AMMONIA: Ammonia: 93 umol/L — ABNORMAL HIGH (ref 9–35)

## 2019-02-13 LAB — BRAIN NATRIURETIC PEPTIDE: B Natriuretic Peptide: 126 pg/mL — ABNORMAL HIGH (ref 0.0–100.0)

## 2019-02-13 MED ORDER — SODIUM CHLORIDE 0.9% FLUSH: 3.0000 mL | INTRAVENOUS | Status: DC | PRN

## 2019-02-13 MED ORDER — FUROSEMIDE 10 MG/ML IJ SOLN
40.0000 mg | Freq: Two times a day (BID) | INTRAMUSCULAR | Status: DC
  Administered 2019-02-14 – 2019-02-16 (×5): 40 mg via INTRAVENOUS
  Filled 2019-02-13 (×5): qty 4

## 2019-02-13 MED ORDER — FOLIC ACID 1 MG PO TABS
1.0000 mg | ORAL_TABLET | Freq: Every day | ORAL | Status: DC
  Administered 2019-02-14 – 2019-02-16 (×3): 1 mg via ORAL
  Filled 2019-02-13 (×3): qty 1

## 2019-02-13 MED ORDER — ADULT MULTIVITAMIN W/MINERALS CH: 1.0000 | ORAL_TABLET | Freq: Every day | ORAL | Status: DC

## 2019-02-13 MED ORDER — THIAMINE HCL 100 MG/ML IJ SOLN: 100.0000 mg | Freq: Every day | INTRAMUSCULAR | Status: DC

## 2019-02-13 MED ORDER — SODIUM CHLORIDE 0.9% FLUSH
3.0000 mL | Freq: Two times a day (BID) | INTRAVENOUS | Status: DC
  Administered 2019-02-13 – 2019-02-16 (×7): 3 mL via INTRAVENOUS

## 2019-02-13 MED ORDER — SPIRONOLACTONE 100 MG PO TABS
100.0000 mg | ORAL_TABLET | Freq: Every day | ORAL | Status: DC
  Administered 2019-02-14 – 2019-02-15 (×2): 100 mg via ORAL
  Filled 2019-02-13 (×2): qty 1
  Filled 2019-02-13: qty 4

## 2019-02-13 MED ORDER — PROPRANOLOL HCL 20 MG PO TABS
20.0000 mg | ORAL_TABLET | Freq: Two times a day (BID) | ORAL | Status: DC
  Administered 2019-02-14 – 2019-02-16 (×5): 20 mg via ORAL
  Filled 2019-02-13 (×7): qty 1

## 2019-02-13 MED ORDER — LACTULOSE 10 GM/15ML PO SOLN
30.0000 g | Freq: Once | ORAL | Status: AC
  Administered 2019-02-13: 30 g via ORAL
  Filled 2019-02-13: qty 60

## 2019-02-13 MED ORDER — ADULT MULTIVITAMIN W/MINERALS CH
1.0000 | ORAL_TABLET | Freq: Every day | ORAL | Status: DC
  Administered 2019-02-14 – 2019-02-16 (×3): 1 via ORAL
  Filled 2019-02-13 (×3): qty 1

## 2019-02-13 MED ORDER — FOLIC ACID 1 MG PO TABS: 1.0000 mg | ORAL_TABLET | Freq: Every day | ORAL | Status: DC

## 2019-02-13 MED ORDER — LORAZEPAM 2 MG/ML IJ SOLN: 1.0000 mg | Freq: Four times a day (QID) | INTRAMUSCULAR | Status: DC | PRN

## 2019-02-13 MED ORDER — LACTULOSE 10 GM/15ML PO SOLN
20.0000 g | Freq: Three times a day (TID) | ORAL | Status: DC
  Administered 2019-02-13 – 2019-02-16 (×8): 20 g via ORAL
  Filled 2019-02-13 (×7): qty 30

## 2019-02-13 MED ORDER — LORAZEPAM 1 MG PO TABS: 1.0000 mg | ORAL_TABLET | Freq: Four times a day (QID) | ORAL | Status: DC | PRN

## 2019-02-13 MED ORDER — PANTOPRAZOLE SODIUM 40 MG PO TBEC
40.0000 mg | DELAYED_RELEASE_TABLET | Freq: Two times a day (BID) | ORAL | Status: DC
  Administered 2019-02-14 – 2019-02-16 (×5): 40 mg via ORAL
  Filled 2019-02-13 (×5): qty 1

## 2019-02-13 MED ORDER — VITAMIN B-1 100 MG PO TABS
100.0000 mg | ORAL_TABLET | Freq: Every day | ORAL | Status: DC
  Administered 2019-02-14 – 2019-02-16 (×3): 100 mg via ORAL
  Filled 2019-02-13 (×3): qty 1

## 2019-02-13 MED ORDER — FUROSEMIDE 10 MG/ML IJ SOLN
40.0000 mg | Freq: Once | INTRAMUSCULAR | Status: AC
  Administered 2019-02-13: 40 mg via INTRAVENOUS
  Filled 2019-02-13: qty 4

## 2019-02-13 MED ORDER — VITAMIN B-1 100 MG PO TABS: 100.0000 mg | ORAL_TABLET | Freq: Every day | ORAL | Status: DC

## 2019-02-13 MED ORDER — POLYETHYLENE GLYCOL 3350 17 G PO PACK: 17.0000 g | PACK | Freq: Every day | ORAL | Status: DC | PRN

## 2019-02-13 MED ORDER — NICOTINE 21 MG/24HR TD PT24
21.0000 mg | MEDICATED_PATCH | Freq: Every day | TRANSDERMAL | Status: DC
  Administered 2019-02-14 – 2019-02-16 (×4): 21 mg via TRANSDERMAL
  Filled 2019-02-13 (×4): qty 1

## 2019-02-13 MED ORDER — SODIUM CHLORIDE 0.9% FLUSH
3.0000 mL | Freq: Two times a day (BID) | INTRAVENOUS | Status: DC
  Administered 2019-02-13 – 2019-02-16 (×6): 3 mL via INTRAVENOUS

## 2019-02-13 MED ORDER — SODIUM CHLORIDE 0.9 % IV SOLN
250.0000 mL | INTRAVENOUS | Status: DC | PRN
  Administered 2019-02-15: 2 mL via INTRAVENOUS

## 2019-02-13 NOTE — ED Notes (Signed)
Pt transported  To floor att

## 2019-02-13 NOTE — H&P (Signed)
[employer] - [place] [facility]   PATIENT NAME: Frederick Cabrera    MR#:  [id number]  DATE OF BIRTH:  Apr 02, 1966  DATE OF ADMISSION:  02/13/2019  PRIMARY CARE PHYSICIAN: Default, Provider, MD   REQUESTING/REFERRING PHYSICIAN: Phineas Semen, MD  CHIEF COMPLAINT:   Chief Complaint  Patient presents with  . Ascites  . Shortness of Breath    HISTORY OF PRESENT ILLNESS:  Frederick Cabrera  is a 53 y.o. male with a known history of alcoholic cirrhosis with ascites, EtOH abuse, GI bleed, chronic hyponatremia.  He presents the emergency room today with increased shortness of breath and abdominal ascites.  He admits to having alcohol consumption last p.m.  He underwent paracentesis 1 week ago with 5 L removed.  Patient admits to having missed a scheduled outpatient appointment over the last week as well.  He has a recent history of GI bleed and follows up with Dr. Norma Fredrickson outpatient.  On review of records, patient has a documented history of diastolic CHF with echocardiogram in 11/18/2018 demonstrating ejection fraction 60 to 65% with normal right ventricular systolic function and diastolic dysfunction.  Currently, patient denies hematemesis, hematochezia, or melena.  He denies hematuria.  He is experiencing increased shortness of breath with minimal exertion such as getting dressed.  He denies chest pain.  No fever no chills, he denies nausea or vomiting.  He denies diarrhea.  Patient not taking lactulose currently.  Ammonia level is 93.  Bilirubin is 4.6 with AST 51.  Patient has a history of chronic hyponatremia.  Sodium is 129.  Hemoglobin is 10.5 hematocrit 29.5.  Platelet count is 91.  BMP 126.   PAST MEDICAL HISTORY:   Past Medical History:  Diagnosis Date  . Alcoholic cirrhosis of liver with ascites (HCC)     PAST SURGICAL HISTORY:   Past Surgical History:  Procedure Laterality Date  . ESOPHAGOGASTRODUODENOSCOPY (EGD) WITH PROPOFOL N/A 01/17/2019   Procedure:  ESOPHAGOGASTRODUODENOSCOPY (EGD) WITH PROPOFOL;  Surgeon: Toledo, Boykin Nearing, MD;  Location: [facility] ENDOSCOPY;  Service: Gastroenterology;  Laterality: N/A;    SOCIAL HISTORY:   Social History   Tobacco Use  . Smoking status: Current Every Day Smoker  . Smokeless tobacco: Never Used  Substance Use Topics  . Alcohol use: Yes    Comment: last drink 02/12/19    FAMILY HISTORY:  No family history on file.  DRUG ALLERGIES:   Allergies  Allergen Reactions  . Wool Alcohol [Lanolin] Rash    REVIEW OF SYSTEMS:   Review of Systems  Constitutional: Negative for chills, diaphoresis, fever and malaise/fatigue.  HENT: Negative for congestion, nosebleeds, sinus pain and sore throat.   Eyes: Negative for blurred vision and double vision.  Respiratory: Positive for cough and shortness of breath. Negative for hemoptysis, sputum production and wheezing.   Cardiovascular: Positive for leg swelling. Negative for chest pain and palpitations.  Gastrointestinal: Negative for abdominal pain, blood in stool, constipation, diarrhea, melena, nausea and vomiting.  Genitourinary: Negative for hematuria.  Musculoskeletal: Negative for falls.  Skin: Negative for itching and rash.  Neurological: Negative for dizziness, seizures, loss of consciousness, weakness and headaches.  Psychiatric/Behavioral: Positive for substance abuse (alcohol ).      MEDICATIONS AT HOME:   Prior to Admission medications   Medication Sig Start Date End Date Taking? Authorizing Provider  furosemide (LASIX) 40 MG tablet Take 40 mg by mouth 2 (two) times daily. 02/11/19 02/11/20 Yes [provider]  nicotine (NICODERM CQ - DOSED IN  MG/24 HOURS) 21 mg/24hr patch Place 1 patch (21 mg total) onto the skin daily. 01/23/19  Yes Adrian SaranMody, Sital, MD  pantoprazole (PROTONIX) 40 MG tablet Take 1 tablet (40 mg total) by mouth 2 (two) times daily before a meal. 01/23/19  Yes Enid BaasKalisetti, Radhika, MD  propranolol (INDERAL) 20 MG tablet Take 20  mg by mouth 2 (two) times daily.   Yes [provider]  spironolactone (ALDACTONE) 100 MG tablet Take 100 mg by mouth daily. 02/11/19 02/11/20 Yes [provider]      VITAL SIGNS:  Blood pressure 132/74, pulse 75, temperature 98.1 F (36.7 C), temperature source Oral, resp. rate (!) 22, height 5\' 5"  (1.651 m), weight 103.4 kg, SpO2 96 %.  PHYSICAL EXAMINATION:  Physical Exam Constitutional:      General: He is not in acute distress. HENT:     Head: Normocephalic.     Mouth/Throat:     Mouth: Mucous membranes are moist.     Pharynx: Oropharynx is clear.  Eyes:     Extraocular Movements: Extraocular movements intact.     Pupils: Pupils are equal, round, and reactive to light.     Comments: Scleral icterus  Neck:     Musculoskeletal: Normal range of motion and neck supple.     Vascular: No JVD.  Cardiovascular:     Rate and Rhythm: Normal rate and regular rhythm.     Heart sounds: No murmur. No friction rub. No gallop.   Pulmonary:     Effort: Pulmonary effort is normal. No accessory muscle usage or respiratory distress.     Breath sounds: Normal breath sounds. No decreased breath sounds, wheezing, rhonchi or rales.  Abdominal:     General: Bowel sounds are normal.     Comments: Distended with marked ascites Umbilical hernia  Musculoskeletal:     Right lower leg: Edema (2+ pitting) present.     Left lower leg: Edema (2+ pitting) present.  Skin:    General: Skin is warm and dry.     Capillary Refill: Capillary refill takes less than 2 seconds.  Neurological:     Mental Status: He is alert and oriented to person, place, and time.     Cranial Nerves: No cranial nerve deficit.     Motor: No weakness.  Psychiatric:        Behavior: Behavior normal.      LABORATORY PANEL:   CBC Recent Labs  Lab 02/13/19 1735  WBC 8.8  HGB 10.5*  HCT 29.5*  PLT 91*    ------------------------------------------------------------------------------------------------------------------  Chemistries  Recent Labs  Lab 02/13/19 1735  NA 129*  K 3.7  CL 100  CO2 21*  GLUCOSE 134*  BUN 7  CREATININE 0.75  CALCIUM 7.9*  AST 51*  ALT 20  ALKPHOS 103  BILITOT 4.6*   ------------------------------------------------------------------------------------------------------------------  Cardiac Enzymes No results for input(s): TROPONINI in the last 168 hours. ------------------------------------------------------------------------------------------------------------------  RADIOLOGY:  Dg Chest Portable 1 View  Result Date: 02/13/2019 CLINICAL DATA:  Fluid-filled duct related to cirrhosis, shortness of breath. EXAM: PORTABLE CHEST 1 VIEW COMPARISON:  Chest x-ray dated 01/17/2019 FINDINGS: Stable cardiomegaly. Mild central pulmonary vascular congestion and bilateral interstitial prominence indicating interstitial edema. No confluent opacity to suggest a developing pneumonia. No pleural effusion or pneumothorax seen. Osseous structures about the chest are unremarkable. IMPRESSION: Cardiomegaly with central pulmonary vascular congestion and bilateral interstitial edema indicating CHF/volume overload. Electronically Signed   By: Bary RichardStan  Maynard M.D.   On: 02/13/2019 18:00  IMPRESSION AND PLAN:     1.  Alcoholic cirrhosis with ascites - Patient will need paracentesis-interventional radiology consulted for paracentesis procedure - Patient has been started on lactulose 20mg  3 times daily for hyperammonemia - Gastroenterology, Dr. Allegra Lai consulted as patient follows up outpatient with Dr. Norma Fredrickson. --PPI therapy initiated given patient's history of GI bleed -No anticoagulants initiated, will continue to monitor hemoglobin hematocrit closely  2. acute on chronic diastolic CHF --Lasix 40 mg IV twice daily - Continue p.o. spironolactone - BNP 128, echo on 11/18/2018  with EF 60 to 65% - O2 per nasal cannula --Continue to trend troponin levels - She is on telemetry monitoring  3.  Hyperammonemia -ammonia level 93 - Lactulose 20 mg p.o. 3 times daily - We will continue to monitor ammonia levels during hospitalization  4.  Chronic hyponatremia - Sodium is 129- stable  5.  EtOH abuse - CIWA protocol initiated  All the records are reviewed and case discussed with ED provider. The plan of care was discussed in details with the patient (and family). I answered all questions. The patient agreed to proceed with the above mentioned plan. Further management will depend upon hospital course.   CODE STATUS: Full code  TOTAL TIME TAKING CARE OF THIS PATIENT: 45 minutes.    Milas Kocher Slaton Reaser CRNP on 02/13/2019 at 9:03 PM  Pager - [phone]  After 6pm go to [email or web address] - Social research officer, government  [facility]  Office  [phone]  CC: Primary care physician; Default, Provider, MD   Note: This dictation was prepared with Dragon dictation along with smaller phrase technology. Any transcriptional errors that result from this process are unintentional.

## 2019-02-13 NOTE — ED Provider Notes (Signed)
[facility] Emergency Department Provider Note  ____________________________________________   I have reviewed the triage vital signs and the nursing notes.   HISTORY  Chief Complaint Ascites and Shortness of Breath   History limited by: Not Limited   HPI Frederick Cabrera is a 53 y.o. male who presents to the emergency department today because of concern for ascites. The patient states that he feels he needs the fluid drained again. Missed a scheduled outpatient appointment the other day because of transportation issues. Says that he does feel like he has been having some increased shortness of breath. Also complaining of increased left sided weakness since having an alcoholic drink last night. Additionally he feels like he has been having worsening memory issues.   Records reviewed. Per medical record review patient has a history of alcoholic cirrhosis. Per medical chart review 2 days ago patient's wife was reporting increased confusion.   Past Medical History:  Diagnosis Date  . Alcoholic cirrhosis of liver with ascites [facility])     Patient Active Problem List   Diagnosis Date Noted  . Alcoholic cirrhosis of liver with ascites (HCC)   . Goals of care, counseling/discussion   . Palliative care by specialist   . Hematemesis 01/17/2019    Past Surgical History:  Procedure Laterality Date  . ESOPHAGOGASTRODUODENOSCOPY (EGD) WITH PROPOFOL N/A 01/17/2019   Procedure: ESOPHAGOGASTRODUODENOSCOPY (EGD) WITH PROPOFOL;  Surgeon: Toledo, Boykin Nearing, MD;  Location: [facility] ENDOSCOPY;  Service: Gastroenterology;  Laterality: N/A;    Prior to Admission medications   Medication Sig Start Date End Date Taking? Authorizing Provider  folic acid (FOLVITE) 1 MG tablet Take 1 mg by mouth daily. 11/22/18 11/22/19  [provider]  lactulose (CHRONULAC) 10 GM/15ML solution Take 45 mLs (30 g total) by mouth daily. 01/23/19   Enid Baas, MD  nadolol (CORGARD) 20 MG tablet  Take 1 tablet (20 mg total) by mouth daily. 01/23/19   Enid Baas, MD  nicotine (NICODERM CQ - DOSED IN MG/24 HOURS) 21 mg/24hr patch Place 1 patch (21 mg total) onto the skin daily. 01/23/19   Adrian Saran, MD  pantoprazole (PROTONIX) 40 MG tablet Take 1 tablet (40 mg total) by mouth 2 (two) times daily before a meal. 01/23/19   Enid Baas, MD  spironolactone (ALDACTONE) 25 MG tablet Take 1 tablet (25 mg total) by mouth daily. 01/23/19   Enid Baas, MD  thiamine 100 MG tablet Take 200 mg by mouth daily. 11/22/18   [provider]    Allergies Wool alcohol [lanolin]  No family history on file.  Social History Social History   Tobacco Use  . Smoking status: Current Every Day Smoker  . Smokeless tobacco: Never Used  Substance Use Topics  . Alcohol use: Yes    Comment: last drink >3wks ago  . Drug use: Not Currently    Review of Systems Constitutional: No fever/chills Eyes: No visual changes. ENT: No sore throat. Cardiovascular: Denies chest pain. Respiratory: Positive for shortness of breath. Gastrointestinal: No abdominal pain.  No nausea, no vomiting.  No diarrhea.   Genitourinary: Negative for dysuria. Musculoskeletal: Negative for back pain. Skin: Negative for rash. Neurological: Positive for increased memory concerns.   ____________________________________________   PHYSICAL EXAM:  VITAL SIGNS: ED Triage Vitals  Enc Vitals Group     BP 02/13/19 1710 123/70     Pulse Rate 02/13/19 1710 75     Resp 02/13/19 1710 20     Temp 02/13/19 1710 98.1 F (36.7 C)  Temp Source 02/13/19 1710 Oral     SpO2 02/13/19 1707 98 %     Weight 02/13/19 1711 227 lb 15.3 oz (103.4 kg)     Height 02/13/19 1711 5\' 5"  (1.651 m)   Constitutional: Alert and oriented.  Eyes: Conjunctivae are normal.  ENT      Head: Normocephalic and atraumatic.      Nose: No congestion/rhinnorhea.      Mouth/Throat: Mucous membranes are moist.      Neck: No  stridor. Hematological/Lymphatic/Immunilogical: No cervical lymphadenopathy. Cardiovascular: Normal rate, regular rhythm.  No murmurs, rubs, or gallops.  Respiratory: Normal respiratory effort without tachypnea nor retractions. Breath sounds are clear and equal bilaterally. No wheezes/rales/rhonchi. Gastrointestinal: Distended. Non tender.  Genitourinary: Deferred Musculoskeletal: Normal range of motion in all extremities. No lower extremity edema. Neurologic:  Normal speech and language. No gross focal neurologic deficits are appreciated.  Skin:  Skin is warm, dry and intact. No rash noted. Psychiatric: Mood and affect are normal. Speech and behavior are normal. Patient exhibits appropriate insight and judgment.  ____________________________________________    LABS (pertinent positives/negatives)  Ammonia 93 CMP na 129, k 3.7, glu 134, cr 0.75, ca 7.9, t bili 4.6 CBC wbc 8.8, hgb 10.5, plt 91 COVID negative  ____________________________________________   EKG  I, Phineas SemenGraydon Jazper Nikolai, attending physician, personally viewed and interpreted this EKG  EKG Time: 1723 Rate: 80 Rhythm: sinus rhythm Axis: left axis deviation Intervals: qtc 464 QRS: lafb ST changes: no st elevation Impression: abnormal ekg  ____________________________________________    RADIOLOGY  CXR Pulmonary edema  ____________________________________________   PROCEDURES  Procedures  ____________________________________________   INITIAL IMPRESSION / ASSESSMENT AND PLAN / ED COURSE  Pertinent labs & imaging results that were available during my care of the patient were reviewed by me and considered in my medical decision making (see chart for details).   Patient presented to the emergency department today because of concerns for some shortness of breath.  Patient's work-up did show some pulmonary edema.  Patient also has significant ascites although this was nontender.  This certainly could be  playing a role as well. Per care everyone patient also has had some increased confusion. Ammonia level is elevated at 93. Discussed findings with patient. Will plan on admission.   ____________________________________________   FINAL CLINICAL IMPRESSION(S) / ED DIAGNOSES  Final diagnoses:  Shortness of breath  Increased ammonia level  Acute pulmonary edema (HCC)     Note: This dictation was prepared with Dragon dictation. Any transcriptional errors that result from this process are unintentional     Phineas SemenGoodman, Jasamine Pottinger, MD 02/13/19 2029

## 2019-02-13 NOTE — ED Notes (Signed)
ED TO INPATIENT HANDOFF REPORT  ED Nurse Name and Phone #: Lurena Joiner [phone]  S Name/Age/Gender Frederick Cabrera 53 y.o. male Room/Bed: ED03A/ED03A  Code Status   Code Status: Full Code  Home/SNF/Other Home Patient oriented to: self, place, time and situation Is this baseline? Yes   Triage Complete: Triage complete  Chief Complaint ascites  Triage Note Pt arrived via EMS from home with reports of fluid buildup related to cirrhosis. Pt recently dx with cirrhosis. Seen recently and had 5L removed. Pt states fluid build up now is about as bad as it was before and states he is having a lot of pressure in abdomen and is pushing on lungs causing him to be short of breath.  Pt reports dark colored urine as well.   Pt states he was supposed to have procedure today for fluid removal. Pt states last night he was given a bloody mary by mistake with 2 shots of 100 proof liquor.   Pt states he is here to have his fluid drained. Pt states he fell a couple of times today and felt dizzy.   Allergies Allergies  Allergen Reactions  . Wool Alcohol [Lanolin] Rash    Level of Care/Admitting Diagnosis ED Disposition    ED Disposition Condition Comment   Admit  Hospital Area: [facility] [[id number]]  Level of Care: Med-Surg [16]  Covid Evaluation: N/A  Diagnosis: Ascites due to alcoholic cirrhosis [facility]) [[id number]]  Admitting Physician: Houston Siren [[id number]]  Attending Physician: Houston Siren [[id number]]  Estimated length of stay: past midnight tomorrow  Certification:: I certify this patient will need inpatient services for at least 2 midnights  PT Class (Do Not Modify): Inpatient [101]  PT Acc Code (Do Not Modify): Private [1]       B Medical/Surgery History Past Medical History:  Diagnosis Date  . Alcoholic cirrhosis of liver with ascites [facility])    Past Surgical History:  Procedure Laterality Date  . ESOPHAGOGASTRODUODENOSCOPY (EGD) WITH PROPOFOL N/A 01/17/2019    Procedure: ESOPHAGOGASTRODUODENOSCOPY (EGD) WITH PROPOFOL;  Surgeon: Toledo, Boykin Nearing, MD;  Location: [facility] ENDOSCOPY;  Service: Gastroenterology;  Laterality: N/A;     A IV Location/Drains/Wounds Patient Lines/Drains/Airways Status   Active Line/Drains/Airways    Name:   Placement date:   Placement time:   Site:   Days:   Peripheral IV 02/13/19 Left Antecubital   02/13/19    1734    Antecubital   less than 1   Incision (Closed) 02/06/19 Abdomen Lower;Right   02/06/19    1145     7          Intake/Output Last 24 hours No intake or output data in the 24 hours ending 02/13/19 2046  Labs/Imaging Results for orders placed or performed during the hospital encounter of 02/13/19 (from the past 48 hour(s))  CBC with Differential     Status: Abnormal   Collection Time: 02/13/19  5:35 PM  Result Value Ref Range   WBC 8.8 4.0 - 10.5 K/uL   RBC 2.97 (L) 4.22 - 5.81 MIL/uL   Hemoglobin 10.5 (L) 13.0 - 17.0 g/dL   HCT 52.8 (L) 41.3 - 24.4 %   MCV 99.3 80.0 - 100.0 fL   MCH 35.4 (H) 26.0 - 34.0 pg   MCHC 35.6 30.0 - 36.0 g/dL   RDW 01.0 27.2 - 53.6 %   Platelets 91 (L) 150 - 400 K/uL    Comment: Immature Platelet Fraction may be clinically indicated, consider ordering this additional  test [id number]    nRBC 0.0 0.0 - 0.2 %   Neutrophils Relative % 60 %   Neutro Abs 5.2 1.7 - 7.7 K/uL   Lymphocytes Relative 20 %   Lymphs Abs 1.8 0.7 - 4.0 K/uL   Monocytes Relative 14 %   Monocytes Absolute 1.3 (H) 0.1 - 1.0 K/uL   Eosinophils Relative 3 %   Eosinophils Absolute 0.3 0.0 - 0.5 K/uL   Basophils Relative 2 %   Basophils Absolute 0.1 0.0 - 0.1 K/uL   Immature Granulocytes 1 %   Abs Immature Granulocytes 0.06 0.00 - 0.07 K/uL    Comment: Performed at [facility], [address] [place], [place] [address]  Ammonia     Status: Abnormal   Collection Time: 02/13/19  5:35 PM  Result Value Ref Range   Ammonia 93 (H) 9 - 35 umol/L    Comment: Performed at [facility],  [address] [place], [place] [address]  Comprehensive metabolic panel     Status: Abnormal   Collection Time: 02/13/19  5:35 PM  Result Value Ref Range   Sodium 129 (L) 135 - 145 mmol/L   Potassium 3.7 3.5 - 5.1 mmol/L   Chloride 100 98 - 111 mmol/L   CO2 21 (L) 22 - 32 mmol/L   Glucose, Bld 134 (H) 70 - 99 mg/dL   BUN 7 6 - 20 mg/dL   Creatinine, Ser 4.69 0.61 - 1.24 mg/dL   Calcium 7.9 (L) 8.9 - 10.3 mg/dL   Total Protein 6.5 6.5 - 8.1 g/dL   Albumin 1.9 (L) 3.5 - 5.0 g/dL   AST 51 (H) 15 - 41 U/L   ALT 20 0 - 44 U/L   Alkaline Phosphatase 103 38 - 126 U/L   Total Bilirubin 4.6 (H) 0.3 - 1.2 mg/dL   GFR calc non Af Amer >60 >60 mL/min   GFR calc Af Amer >60 >60 mL/min   Anion gap 8 5 - 15    Comment: Performed at [facility], [address] [place], [place] [address]  SARS Coronavirus 2 [facility] order, Performed in [facility] hospital lab)     Status: None   Collection Time: 02/13/19  6:44 PM  Result Value Ref Range   SARS Coronavirus 2 NEGATIVE NEGATIVE    Comment: (NOTE) If result is NEGATIVE SARS-CoV-2 target nucleic acids are NOT DETECTED. The SARS-CoV-2 RNA is generally detectable in upper and lower  respiratory specimens during the acute phase of infection. The lowest  concentration of SARS-CoV-2 viral copies this assay can detect is 250  copies / mL. A negative result does not preclude SARS-CoV-2 infection  and should not be used as the sole basis for treatment or other  patient management decisions.  A negative result may occur with  improper specimen collection / handling, submission of specimen other  than nasopharyngeal swab, presence of viral mutation(s) within the  areas targeted by this assay, and inadequate number of viral copies  (<250 copies / mL). A negative result must be combined with clinical  observations, patient history, and epidemiological information. If result is POSITIVE SARS-CoV-2 target nucleic acids are DETECTED. The  SARS-CoV-2 RNA is generally detectable in upper and lower  respiratory specimens dur ing the acute phase of infection.  Positive  results are indicative of active infection with SARS-CoV-2.  Clinical  correlation with patient history and other diagnostic information is  necessary to determine patient infection status.  Positive results do  not  rule out bacterial infection or co-infection with other viruses. If result is PRESUMPTIVE POSTIVE SARS-CoV-2 nucleic acids MAY BE PRESENT.   A presumptive positive result was obtained on the submitted specimen  and confirmed on repeat testing.  While 2019 novel coronavirus  (SARS-CoV-2) nucleic acids may be present in the submitted sample  additional confirmatory testing may be necessary for epidemiological  and / or clinical management purposes  to differentiate between  SARS-CoV-2 and other Sarbecovirus currently known to infect humans.  If clinically indicated additional testing with an alternate test  methodology [phone]([id number]) is advised. The SARS-CoV-2 RNA is generally  detectable in upper and lower respiratory sp ecimens during the acute  phase of infection. The expected result is Negative. Fact Sheet for Patients:  BoilerBrush.com.cyhttps://www.fda.gov/media/136312/download Fact Sheet for Healthcare Providers: https://pope.com/https://www.fda.gov/media/136313/download This test is not yet approved or cleared by the [place] FDA and has been authorized for detection and/or diagnosis of SARS-CoV-2 by FDA under an Emergency Use Authorization (EUA).  This EUA will remain in effect (meaning this test can be used) for the duration of the COVID-19 declaration under Section 564(b)(1) of the Act, 21 U.S.C. section 360bbb-3(b)(1), unless the authorization is terminated or revoked sooner. Performed at [facility], [address] [place], [place] [address]    Dg Chest Portable 1 View  Result Date: 02/13/2019 CLINICAL DATA:  Fluid-filled duct related to cirrhosis,  shortness of breath. EXAM: PORTABLE CHEST 1 VIEW COMPARISON:  Chest x-ray dated 01/17/2019 FINDINGS: Stable cardiomegaly. Mild central pulmonary vascular congestion and bilateral interstitial prominence indicating interstitial edema. No confluent opacity to suggest a developing pneumonia. No pleural effusion or pneumothorax seen. Osseous structures about the chest are unremarkable. IMPRESSION: Cardiomegaly with central pulmonary vascular congestion and bilateral interstitial edema indicating CHF/volume overload. Electronically Signed   By: Bary RichardStan  Maynard M.D.   On: 02/13/2019 18:00    Pending Labs Unresulted Labs (From admission, onward)    Start     Ordered   02/14/19 0500  Basic metabolic panel  Tomorrow morning,   STAT     02/13/19 2039   02/14/19 0500  CBC  Tomorrow morning,   STAT     02/13/19 2039   02/14/19 0500  Protime-INR  Tomorrow morning,   STAT     02/13/19 2039   02/13/19 2040  Brain natriuretic peptide  Once,   STAT     02/13/19 2039          Vitals/Pain Today's Vitals   02/13/19 1830 02/13/19 1900 02/13/19 1930 02/13/19 2000  BP: 122/67 132/79 134/79 137/75  Pulse: 76 73 71 77  Resp: (!) 23 (!) 22 20 18   Temp:      TempSrc:      SpO2: 97% 97% 96% 96%  Weight:      Height:      PainSc:        Isolation Precautions No active isolations  Medications Medications  lactulose (CHRONULAC) 10 GM/15ML solution 30 g (has no administration in time range)  nicotine (NICODERM CQ - dosed in mg/24 hours) patch 21 mg (has no administration in time range)  pantoprazole (PROTONIX) EC tablet 40 mg (has no administration in time range)  propranolol (INDERAL) tablet 20 mg (has no administration in time range)  spironolactone (ALDACTONE) tablet 100 mg (has no administration in time range)  sodium chloride flush (NS) 0.9 % injection 3 mL (has no administration in time range)  sodium chloride flush (NS) 0.9 % injection 3 mL (has no administration in time  range)  sodium chloride  flush (NS) 0.9 % injection 3 mL (has no administration in time range)  0.9 %  sodium chloride infusion (has no administration in time range)  polyethylene glycol (MIRALAX / GLYCOLAX) packet 17 g (has no administration in time range)  folic acid (FOLVITE) tablet 1 mg (has no administration in time range)  multivitamin with minerals tablet 1 tablet (has no administration in time range)  thiamine (VITAMIN B-1) tablet 100 mg (has no administration in time range)  LORazepam (ATIVAN) tablet 1 mg (has no administration in time range)    Or  LORazepam (ATIVAN) injection 1 mg (has no administration in time range)  thiamine (VITAMIN B-1) tablet 100 mg (has no administration in time range)    Or  thiamine (B-1) injection 100 mg (has no administration in time range)  folic acid (FOLVITE) tablet 1 mg (has no administration in time range)  multivitamin with minerals tablet 1 tablet (has no administration in time range)  furosemide (LASIX) injection 40 mg (has no administration in time range)  lactulose (CHRONULAC) 10 GM/15ML solution 20 g (has no administration in time range)  furosemide (LASIX) injection 40 mg (40 mg Intravenous Given 02/13/19 1944)    Mobility walks High fall risk   Focused Assessments Pulmonary Assessment Handoff:  Lung sounds: Bilateral Breath Sounds: Diminished L Breath Sounds: Diminished O2 Device: Room Air        R Recommendations: See Admitting Provider Note  Report given to:   Additional Notes:

## 2019-02-13 NOTE — ED Notes (Signed)
Admitting physician at bedside

## 2019-02-13 NOTE — ED Triage Notes (Addendum)
Pt arrived via EMS from home with reports of fluid buildup related to cirrhosis. Pt recently dx with cirrhosis. Seen recently and had 5L removed. Pt states fluid build up now is about as bad as it was before and states he is having a lot of pressure in abdomen and is pushing on lungs causing him to be short of breath.  Pt reports dark colored urine as well.   Pt states he was supposed to have procedure today for fluid removal. Pt states last night he was given a bloody mary by mistake with 2 shots of 100 proof liquor.   Pt states he is here to have his fluid drained. Pt states he fell a couple of times today and felt dizzy.

## 2019-02-13 NOTE — ED Notes (Signed)
Admitting Provider at bedside. 

## 2019-02-14 ENCOUNTER — Observation Stay: Payer: Medicaid Other

## 2019-02-14 DIAGNOSIS — K7031 Alcoholic cirrhosis of liver with ascites: Principal | ICD-10-CM

## 2019-02-14 DIAGNOSIS — R109 Unspecified abdominal pain: Secondary | ICD-10-CM | POA: Diagnosis present

## 2019-02-14 LAB — BASIC METABOLIC PANEL
Anion gap: 5 (ref 5–15)
BUN: 8 mg/dL (ref 6–20)
CO2: 23 mmol/L (ref 22–32)
Calcium: 7.6 mg/dL — ABNORMAL LOW (ref 8.9–10.3)
Chloride: 103 mmol/L (ref 98–111)
Creatinine, Ser: 0.75 mg/dL (ref 0.61–1.24)
GFR calc Af Amer: >60 mL/min (ref >60)
GFR calc non Af Amer: >60 mL/min (ref >60)
Glucose, Bld: 138 mg/dL — ABNORMAL HIGH (ref 70–99)
Potassium: 3.7 mmol/L (ref 3.5–5.1)
Sodium: 131 mmol/L — ABNORMAL LOW (ref 135–145)

## 2019-02-14 LAB — CBC
HCT: 28.8 % — ABNORMAL LOW (ref 39.0–52.0)
Hemoglobin: 10.2 g/dL — ABNORMAL LOW (ref 13.0–17.0)
MCH: 35.1 pg — ABNORMAL HIGH (ref 26.0–34.0)
MCHC: 35.4 g/dL (ref 30.0–36.0)
MCV: 99 fL (ref 80.0–100.0)
Platelets: 85 10*3/uL — ABNORMAL LOW (ref 150–400)
RBC: 2.91 MIL/uL — ABNORMAL LOW (ref 4.22–5.81)
RDW: 15.2 % (ref 11.5–15.5)
WBC: 8.2 10*3/uL (ref 4.0–10.5)
nRBC: 0 % (ref 0.0–0.2)

## 2019-02-14 LAB — PROTIME-INR
INR: 1.8 — ABNORMAL HIGH (ref 0.8–1.2)
Prothrombin Time: 20.3 seconds — ABNORMAL HIGH (ref 11.4–15.2)

## 2019-02-14 LAB — TROPONIN I
Troponin I: <0.03 ng/mL (ref <0.03)
Troponin I: 0.03 ng/mL (ref <0.03)

## 2019-02-14 NOTE — Progress Notes (Signed)
[employer] -  at [facility]                                                                                                                                                                                  Patient Demographics   Frederick Cabrera, is a 53 y.o. male, DOB - [birth year], ZOX:[id number]  Admit date - 02/13/2019   Admitting Physician Houston Siren, MD  Outpatient Primary MD for the patient is Default, Provider, MD   LOS - 1  Subjective: Patient complains of distention and is tense. Patient states that she had ascites drained about a week ago.   Review of Systems:   CONSTITUTIONAL: No documented fever. No fatigue, weakness. No weight gain, no weight loss.  EYES: No blurry or double vision.  ENT: No tinnitus. No postnasal drip. No redness of the oropharynx.  RESPIRATORY: No cough, no wheeze, no hemoptysis. No dyspnea.  CARDIOVASCULAR: No chest pain. No orthopnea. No palpitations. No syncope.  GASTROINTESTINAL: No nausea, no vomiting or diarrhea. No abdominal pain. No melena or hematochezia.  Positive abdominal distention GENITOURINARY: No dysuria or hematuria.  ENDOCRINE: No polyuria or nocturia. No heat or cold intolerance.  HEMATOLOGY: No anemia. No bruising. No bleeding.  INTEGUMENTARY: No rashes. No lesions.  MUSCULOSKELETAL: No arthritis. No swelling. No gout.  NEUROLOGIC: No numbness, tingling, or ataxia. No seizure-type activity.  PSYCHIATRIC: No anxiety. No insomnia. No ADD.    Vitals:   Vitals:   02/13/19 2030 02/13/19 2215 02/14/19 0502 02/14/19 1156  BP: 132/74 (!) 145/81 137/80 122/75  Pulse: 75 82 79 77  Resp: (!) Temp:  98.6 F (37 C) 98.3 F (36.8 C) 98.3 F (36.8 C)  TempSrc:  Oral Oral Oral  SpO2: 96% 99% 99% 98%  Weight:   100 kg   Height:    (1.651 m)     Wt Readings from Last 3 Encounters:  02/14/19 100 kg  02/05/19 97.4 kg  01/17/19 97.4 kg     Intake/Output Summary (Last 24 hours) at 02/14/2019  1236 Last data filed at 02/14/2019 0900 Gross per 24 hour  Intake 240 ml  Output 400 ml  Net -160 ml    Physical Exam:   GENERAL: Pleasant-appearing in no apparent distress.  HEAD, EYES, EARS, NOSE AND THROAT: Atraumatic, normocephalic. Extraocular muscles are intact. Pupils equal and reactive to light. Sclerae anicteric. No conjunctival injection. No oro-pharyngeal erythema.  NECK: Supple. There is no jugular venous distention. No bruits, no lymphadenopathy, no thyromegaly.  HEART: Regular rate and rhythm,. No murmurs, no rubs, no clicks.  LUNGS: Clear to auscultation bilaterally. No rales or rhonchi. No wheezes.  ABDOMEN: Soft, flat, nontender, positive distention tense has good bowel sounds. No hepatosplenomegaly appreciated.  EXTREMITIES: No evidence of any cyanosis, clubbing, or peripheral edema.  +2 pedal and radial pulses bilaterally.  NEUROLOGIC: The patient is alert, awake, and oriented x3 with no focal motor or sensory deficits appreciated bilaterally.  SKIN: Moist and warm with no rashes appreciated.  Psych: Not anxious, depressed LN: No inguinal LN enlargement    Antibiotics   Anti-infectives (From admission, onward)   None      Medications   Scheduled Meds: . folic acid  1 mg Oral Daily  . furosemide  40 mg Intravenous Q12H  . lactulose  20 g Oral TID  . multivitamin with minerals  1 tablet Oral Daily  . nicotine  21 mg Transdermal Daily  . pantoprazole  40 mg Oral BID AC  . propranolol  20 mg Oral BID  . sodium chloride flush  3 mL Intravenous Q12H  . sodium chloride flush  3 mL Intravenous Q12H  . spironolactone  100 mg Oral Daily  . thiamine  100 mg Oral Daily   Or  . thiamine  100 mg Intravenous Daily   Continuous Infusions: . sodium chloride     PRN Meds:.sodium chloride, LORazepam **OR** LORazepam, polyethylene glycol, sodium chloride flush   Data Review:   Micro Results Recent Results (from the past 240 hour(s))  SARS Coronavirus 2 (CEPHEID  - Performed in [facility] hospital lab), Hosp Order     Status: None   Collection Time: 02/05/19 10:49 PM  Result Value Ref Range Status   SARS Coronavirus 2 NEGATIVE NEGATIVE Final    Comment: (NOTE) If result is NEGATIVE SARS-CoV-2 target nucleic acids are NOT DETECTED. The SARS-CoV-2 RNA is generally detectable in upper and lower  respiratory specimens during the acute phase of infection. The lowest  concentration of SARS-CoV-2 viral copies this assay can detect is 250  copies / mL. A negative result does not preclude SARS-CoV-2 infection  and should not be used as the sole basis for treatment or other  patient management decisions.  A negative result may occur with  improper specimen collection / handling, submission of specimen other  than nasopharyngeal swab, presence of viral mutation(s) within the  areas targeted by this assay, and inadequate number of viral copies  (<250 copies / mL). A negative result must be combined with clinical  observations, patient history, and epidemiological information. If result is POSITIVE SARS-CoV-2 target nucleic acids are DETECTED. The SARS-CoV-2 RNA is generally detectable in upper and lower  respiratory specimens dur ing the acute phase of infection.  Positive  results are indicative of active infection with SARS-CoV-2.  Clinical  correlation with patient history and other diagnostic information is  necessary to determine patient infection status.  Positive results do  not rule out bacterial infection or co-infection with other viruses. If result is PRESUMPTIVE POSTIVE SARS-CoV-2 nucleic acids MAY BE PRESENT.   A presumptive positive result was obtained on the submitted specimen  and confirmed on repeat testing.  While 2019 novel coronavirus  (SARS-CoV-2) nucleic acids may be present in the submitted sample  additional confirmatory testing may be necessary for epidemiological  and / or clinical management purposes  to differentiate between   SARS-CoV-2 and other Sarbecovirus currently known to infect humans.  If clinically indicated additional testing with an alternate test  methodology [phone]) is advised. The SARS-CoV-2 RNA is generally  detectable in upper and lower respiratory sp ecimens during the acute  phase of infection. The expected result is Negative. Fact Sheet for Patients:  BoilerBrush.com.cy Fact Sheet for Healthcare Providers: https://pope.com/ This test is not yet approved or cleared by the [place] FDA and has been authorized for detection and/or diagnosis of SARS-CoV-2 by FDA under an Emergency Use Authorization (EUA).  This EUA will remain in effect (meaning this test can be used) for the duration of the COVID-19 declaration under Section 564(b)(1) of the Act, 21 U.S.C. section 360bbb-3(b)(1), unless the authorization is terminated or revoked sooner. Performed at [facility], [address] [place], [place] [address]   Body fluid culture     Status: None   Collection Time: 02/06/19 11:43 AM  Result Value Ref Range Status   Specimen Description   Final    PERITONEAL Performed at [facility], [address] [place], [place] [address]    Special Requests   Final    NONE Performed at [facility], [address] [place], [place] [address]    Gram Stain   Final    FEW WBC PRESENT, PREDOMINANTLY PMN NO ORGANISMS SEEN    Culture   Final    NO GROWTH 3 DAYS Performed at [facility], [address], [place], [place] [address]    Report Status 02/09/2019 FINAL  Final  Fungus Culture With Stain     Status: None (Preliminary result)   Collection Time: 02/06/19 11:43 AM  Result Value Ref Range Status   Fungus Stain Final report  Final    Comment: (NOTE) Performed At: [facility] [address] [place], [place] [id number] Jolene Schimke MD NU:[id number]    Fungus (Mycology) Culture PENDING   Incomplete   Fungal Source FLUID  Final    Comment: Performed at [facility], [address] [place], [place] [address]  Fungus Culture Result     Status: None   Collection Time: 02/06/19 11:43 AM  Result Value Ref Range Status   Result 1 Comment  Final    Comment: (NOTE) KOH/Calcofluor preparation:  no fungus observed. Performed At: [facility] [address] [place], [place] [id number] Jolene Schimke MD EP:[id number]   SARS Coronavirus 2 [facility] order, Performed in [facility] hospital lab)     Status: None   Collection Time: 02/13/19  6:44 PM  Result Value Ref Range Status   SARS Coronavirus 2 NEGATIVE NEGATIVE Final    Comment: (NOTE) If result is NEGATIVE SARS-CoV-2 target nucleic acids are NOT DETECTED. The SARS-CoV-2 RNA is generally detectable in upper and lower  respiratory specimens during the acute phase of infection. The lowest  concentration of SARS-CoV-2 viral copies this assay can detect is 250  copies / mL. A negative result does not preclude SARS-CoV-2 infection  and should not be used as the sole basis for treatment or other  patient management decisions.  A negative result may occur with  improper specimen collection / handling, submission of specimen other  than nasopharyngeal swab, presence of viral mutation(s) within the  areas targeted by this assay, and inadequate number of viral copies  (<250 copies / mL). A negative result must be combined with clinical  observations, patient history, and epidemiological information. If result is POSITIVE SARS-CoV-2 target nucleic acids are DETECTED. The SARS-CoV-2 RNA is generally detectable in upper and lower  respiratory specimens dur ing the acute phase of infection.  Positive  results are indicative of active infection with SARS-CoV-2.  Clinical  correlation with patient history and other diagnostic  information is  necessary to determine patient infection status.  Positive results do  not  rule out bacterial infection or co-infection with other viruses. If result is PRESUMPTIVE POSTIVE SARS-CoV-2 nucleic acids MAY BE PRESENT.   A presumptive positive result was obtained on the submitted specimen  and confirmed on repeat testing.  While 2019 novel coronavirus  (SARS-CoV-2) nucleic acids may be present in the submitted sample  additional confirmatory testing may be necessary for epidemiological  and / or clinical management purposes  to differentiate between  SARS-CoV-2 and other Sarbecovirus currently known to infect humans.  If clinically indicated additional testing with an alternate test  methodology [phone]) is advised. The SARS-CoV-2 RNA is generally  detectable in upper and lower respiratory sp ecimens during the acute  phase of infection. The expected result is Negative. Fact Sheet for Patients:  BoilerBrush.com.cy Fact Sheet for Healthcare Providers: https://pope.com/ This test is not yet approved or cleared by the [place] FDA and has been authorized for detection and/or diagnosis of SARS-CoV-2 by FDA under an Emergency Use Authorization (EUA).  This EUA will remain in effect (meaning this test can be used) for the duration of the COVID-19 declaration under Section 564(b)(1) of the Act, 21 U.S.C. section 360bbb-3(b)(1), unless the authorization is terminated or revoked sooner. Performed at [facility], [address], [place], [place] [address]     Radiology Reports Ct Abdomen Pelvis Wo Contrast  Result Date: 01/19/2019 CLINICAL DATA:  Possible gastrointestinal bleeding. EXAM: CT ABDOMEN AND PELVIS WITHOUT CONTRAST TECHNIQUE: Multidetector CT imaging of the abdomen and pelvis was performed following the standard protocol without IV contrast. COMPARISON:  None. FINDINGS: Lower chest: Minimal bilateral pleural effusions are noted. Right middle lobe opacity is noted concerning for possible pneumonia  or atelectasis. Hepatobiliary: No gallstones or biliary dilatation is noted. Lobular hepatic contour is noted suggesting cirrhosis. Mild ascites is noted around the liver. Pancreas: Unremarkable. No pancreatic ductal dilatation or surrounding inflammatory changes. Spleen: Normal in size without focal abnormality. Minimal amount of ascites is noted around the spleen. Adrenals/Urinary Tract: Adrenal glands are unremarkable. Kidneys are normal, without renal calculi, focal lesion, or hydronephrosis. Bladder is unremarkable. Stomach/Bowel: Stomach is within normal limits. Appendix appears normal. No evidence of bowel wall thickening, distention, or inflammatory changes. Vascular/Lymphatic: Aortic atherosclerosis. No enlarged abdominal or pelvic lymph nodes. Reproductive: Prostate is unremarkable. Other: Minimal ascites is noted in the pelvis pericolic gutters and around the liver and spleen. Musculoskeletal: No acute or significant osseous findings. IMPRESSION: Hepatic cirrhosis is noted. Minimal ascites is noted. No evidence of hemorrhage is noted. Minimal bilateral pleural effusions are noted with adjacent subsegmental atelectasis. Right middle lobe opacity is noted concerning for possible pneumonia or atelectasis. Aortic Atherosclerosis (ICD10-I70.0). Electronically Signed   By: Lupita Raider M.D.   On: 01/19/2019 07:47   US Paracentesis  Result Date: 02/06/2019 INDICATION: History of alcoholic cirrhosis with recurrent symptomatic intra-abdominal ascites. Please perform ultrasound-guided paracentesis for diagnostic and therapeutic purposes. Max paracentesis volume of 5 L. EXAM: ULTRASOUND-GUIDED PARACENTESIS COMPARISON:  CT abdomen pelvis-01/19/2019; ultrasound-guided paracentesis-01/18/2019, yielding 5 L of peritoneal fluid. MEDICATIONS: None. COMPLICATIONS: None immediate. TECHNIQUE: Informed written consent was obtained from the patient after a discussion of the risks, benefits and alternatives to treatment.  A timeout was performed prior to the initiation of the procedure. Initial ultrasound scanning demonstrates a large amount of ascites within the right lower abdominal quadrant. The right lower abdomen was prepped and draped in the usual sterile fashion. 1% lidocaine with  epinephrine was used for local anesthesia. An ultrasound image was saved for documentation purposed. An 8 Fr Safe-T-Centesis catheter was introduced. The paracentesis was performed. The catheter was removed and a dressing was applied. The patient tolerated the procedure well without immediate post procedural complication. FINDINGS: A total of approximately 5 liters of serous fluid was removed. Samples were sent to the laboratory as requested by the clinical team. IMPRESSION: Successful ultrasound-guided paracentesis yielding 5 liters of peritoneal fluid. Electronically Signed   By: Simonne ComeJohn  Watts M.D.   On: 02/06/2019 12:28   [place] Paracentesis  Result Date: 01/18/2019 CLINICAL DATA:  Upper GI bleed, cirrhosis, ascites EXAM: ULTRASOUND GUIDED PARACENTESIS TECHNIQUE: The procedure, risks (including but not limited to bleeding, infection, organ damage ), benefits, and alternatives were explained to the patient. Questions regarding the procedure were encouraged and answered. The patient understands and consents to the procedure. Survey ultrasound of the abdomen was performed and an appropriate skin entry site in the right upper abdomen was selected. Skin site was marked, prepped with chlorhexadine, draped in usual sterile fashion, and infiltrated locally with 1% lidocaine. A Safe-T-Centesis needle was advanced into the peritoneal space until fluid could be aspirated. The sheath was advanced and the needle removed. 5 L of dark yellow ascites were aspirated. The patient tolerated the procedure well. COMPLICATIONS: COMPLICATIONS none IMPRESSION: Technically successful ultrasound guided paracentesis, removing 5 L ascites. Electronically Signed   By: Corlis Leak   Hassell M.D.   On: 01/18/2019 12:45   Dg Chest Portable 1 View  Result Date: 02/13/2019 CLINICAL DATA:  Fluid-filled duct related to cirrhosis, shortness of breath. EXAM: PORTABLE CHEST 1 VIEW COMPARISON:  Chest x-ray dated 01/17/2019 FINDINGS: Stable cardiomegaly. Mild central pulmonary vascular congestion and bilateral interstitial prominence indicating interstitial edema. No confluent opacity to suggest a developing pneumonia. No pleural effusion or pneumothorax seen. Osseous structures about the chest are unremarkable. IMPRESSION: Cardiomegaly with central pulmonary vascular congestion and bilateral interstitial edema indicating CHF/volume overload. Electronically Signed   By: Bary RichardStan  Maynard M.D.   On: 02/13/2019 18:00   Dg Chest Portable 1 View  Result Date: 01/17/2019 CLINICAL DATA:  Shortness of breath onset last night. Smoker. PORTABLE CHEST 1 VIEW COMPARISON:  None. FINDINGS: Cardiomegaly. No consolidation or effusion. Mild prominence of the interstitium. BILATERAL pulmonary opacities could represent edema or infection. No pneumothorax. Bones unremarkable. IMPRESSION: Cardiomegaly. BILATERAL pulmonary opacities could represent edema or infection. Electronically Signed   By: Elsie StainJohn T Curnes M.D.   On: 01/17/2019 07:08   [place] Ekg Site Rite  Result Date: 01/17/2019 If Site Rite image not attached, placement could not be confirmed due to current cardiac rhythm.    CBC Recent Labs  Lab 02/13/19 1735 02/14/19 0252  WBC 8.8 8.2  HGB 10.5* 10.2*  HCT 29.5* 28.8*  PLT 91* 85*  MCV 99.3 99.0  MCH 35.4* 35.1*  MCHC 35.6 35.4  RDW 15.2 15.2  LYMPHSABS 1.8  --   MONOABS 1.3*  --   EOSABS 0.3  --   BASOSABS 0.1  --     Chemistries  Recent Labs  Lab 02/13/19 1735 02/14/19 0252  NA 129* 131*  K 3.7 3.7  CL 100 103  CO2 21* 23  GLUCOSE 134* 138*  BUN 7 8  CREATININE 0.75 0.75  CALCIUM 7.9* 7.6*  AST 51*  --   ALT 20  --   ALKPHOS 103  --   BILITOT 4.6*  --     ------------------------------------------------------------------------------------------------------------------ estimated creatinine clearance is 117.5 mL/min (by C-G  formula based on SCr of 0.75 mg/dL). ------------------------------------------------------------------------------------------------------------------ No results for input(s): HGBA1C in the last 72 hours. ------------------------------------------------------------------------------------------------------------------ No results for input(s): CHOL, HDL, LDLCALC, TRIG, CHOLHDL, LDLDIRECT in the last 72 hours. ------------------------------------------------------------------------------------------------------------------ No results for input(s): TSH, T4TOTAL, T3FREE, THYROIDAB in the last 72 hours.  Invalid input(s): FREET3 ------------------------------------------------------------------------------------------------------------------ No results for input(s): VITAMINB12, FOLATE, FERRITIN, TIBC, IRON, RETICCTPCT in the last 72 hours.  Coagulation profile Recent Labs  Lab 02/14/19 0252  INR 1.8*    No results for input(s): DDIMER in the last 72 hours.  Cardiac Enzymes Recent Labs  Lab 02/13/19 1735 02/14/19 0252 02/14/19 0916  TROPONINI <0.03 <0.03 0.03*   ------------------------------------------------------------------------------------------------------------------ Invalid input(s): POCBNP    Assessment & Plan    1.  Alcoholic cirrhosis with ascites - I spoke to radiologist plan for drainage today if they will be able to get to him - Patient has been started on lactulose  3 times daily for hyperammonemia - Gastroenterology, Dr. Allegra Lai consulted as patient follows up outpatient with Dr. Norma Fredrickson. --PPI therapy initiated given patient's history of GI bleed   2. acute on chronic diastolic CHF --Lasix 40 mg IV twice daily - Continue p.o. spironolactone - BNP 128, echo on 11/18/2018 with EF 60 to  65% - O2 per nasal cannula --Likely abnormal troponin due to demand ischemia   3.  Hyperammonemia -ammonia level 93 - Lactulose 20 mg p.o. 3 times daily - Recheck ammonia level tomorrow morning -Patient's mental status is normal  4.  Chronic hyponatremia - Sodium is 131- stable  5.  EtOH abuse - CIWA protocol initiated       Code Status Orders  (From admission, onward)         Start     Ordered   02/13/19 2040  Full code  Continuous     02/13/19 2039        Code Status History    Date Active Date Inactive Code Status Order ID Comments User Context   02/06/2019 0031 02/06/2019 1835 Full Code [id number]  Oralia Manis, MD Inpatient   01/17/2019 0930 01/23/2019 1905 Full Code [id number]  Ramonita Lab, MD ED   01/17/2019 0810 01/17/2019 0930 Full Code [id number]  Nita Sickle, MD ED           Consults none  DVT Prophylaxis SCDs  Lab Results  Component Value Date   PLT 85 (L) 02/14/2019     Time Spent in minutes   35 minutes  Greater than 50% of time spent in care coordination and counseling patient regarding the condition and plan of care.   Auburn Bilberry M.D on 02/14/2019 at 12:36 PM  Between 7am to 6pm - Pager - [phone]  After 6pm go to [email or web address] - Social research officer, government  [employer]   Office  [phone]

## 2019-02-14 NOTE — Consult Note (Signed)
Frederick Darby, MD [address]  [place]  [place], [place] [address]  Main: [phone]  Fax: [phone] Pager: [phone]   Consultation  Referring Provider:     No ref. provider found Primary Care Physician:  Default, Provider, MD Primary Gastroenterologist:  Dr. Alice Reichert         Reason for Consultation:     Abdominal distention, ascites  Date of Admission:  02/13/2019 Date of Consultation:  02/14/2019         HPI:   Frederick Cabrera is a 53 y.o. male alcoholic decompensated cirrhosis with ascites, nonbleeding esophageal varices, hyperbilirubinemia who was recently admitted to [facility] secondary to upper GI bleed, found to have large esophageal varices as well as severe portal hypertensive gastropathy.  Patient underwent repeated therapeutic paracentesis since beginning of this year.  He was seen by Dr. Alice Reichert as outpatient on 02/11/2019.  He is started on Lasix 40 mg twice daily and spironolactone 100 mg daily. Patient tells me that he does not have Lasix at home.  He has been taking only spironolactone.  He also drank alcohol prior to admission.  He is readmitted to [facility] last night secondary to worsening of abdominal distention and swelling of legs.  He denies black stools, rectal bleeding, hematemesis.  He is started on IV Lasix.  He denies having a bowel movement since admission although he has been receiving lactulose.  He reports that his abdomen is very tight, uncomfortable.  He denies fever, chills.  Ultrasound paracentesis is ordered   NSAIDs: None  Antiplts/Anticoagulants/Anti thrombotics: None  GI Procedures: EGD by Dr. Alice Reichert in 12/2018 - Grade II esophageal varices. - Portal hypertensive gastropathy. - Normal examined duodenum. - LA Grade A reflux esophagitis. - The examination was otherwise normal. - No specimens collected.  Past Medical History:  Diagnosis Date  . Alcoholic cirrhosis of liver with ascites [employer])     Past Surgical History:  Procedure  Laterality Date  . ESOPHAGOGASTRODUODENOSCOPY (EGD) WITH PROPOFOL N/A 01/17/2019   Procedure: ESOPHAGOGASTRODUODENOSCOPY (EGD) WITH PROPOFOL;  Surgeon: Toledo, Benay Pike, MD;  Location: [facility] ENDOSCOPY;  Service: Gastroenterology;  Laterality: N/A;    Prior to Admission medications   Medication Sig Start Date End Date Taking? Authorizing Provider  furosemide (LASIX) 40 MG tablet Take 40 mg by mouth 2 (two) times daily. 02/11/19 02/11/20 Yes [provider]  nicotine (NICODERM CQ - DOSED IN MG/24 HOURS) 21 mg/24hr patch Place 1 patch (21 mg total) onto the skin daily. 01/23/19  Yes Bettey Costa, MD  pantoprazole (PROTONIX) 40 MG tablet Take 1 tablet (40 mg total) by mouth 2 (two) times daily before a meal. 01/23/19  Yes Gladstone Lighter, MD  propranolol (INDERAL) 20 MG tablet Take 20 mg by mouth 2 (two) times daily.   Yes [provider]  spironolactone (ALDACTONE) 100 MG tablet Take 100 mg by mouth daily. 02/11/19 02/11/20 Yes [provider]   Current Facility-Administered Medications:  .  0.9 %  sodium chloride infusion, 250 mL, Intravenous, PRN, Seals, Angela H, NP .  folic acid (FOLVITE) tablet 1 mg, 1 mg, Oral, Daily, Seals, Angela H, NP, 1 mg at 02/14/19 0857 .  furosemide (LASIX) injection 40 mg, 40 mg, Intravenous, Q12H, Seals, Angela H, NP, 40 mg at 02/14/19 0856 .  lactulose (CHRONULAC) 10 GM/15ML solution 20 g, 20 g, Oral, TID, Seals, Angela H, NP, 20 g at 02/14/19 0856 .  LORazepam (ATIVAN) tablet 1 mg, 1 mg, Oral, Q6H PRN **OR** LORazepam (  ATIVAN) injection 1 mg, 1 mg, Intravenous, Q6H PRN, Seals, Angela H, NP .  multivitamin with minerals tablet 1 tablet, 1 tablet, Oral, Daily, Seals, Angela H, NP, 1 tablet at 02/14/19 0857 .  nicotine (NICODERM CQ - dosed in mg/24 hours) patch 21 mg, 21 mg, Transdermal, Daily, Seals, Angela H, NP, 21 mg at 02/14/19 0858 .  pantoprazole (PROTONIX) EC tablet 40 mg, 40 mg, Oral, BID AC, Seals, Angela H, NP, 40 mg at 02/14/19 0857 .   polyethylene glycol (MIRALAX / GLYCOLAX) packet 17 g, 17 g, Oral, Daily PRN, Seals, Angela H, NP .  propranolol (INDERAL) tablet 20 mg, 20 mg, Oral, BID, Seals, Angela H, NP, 20 mg at 02/14/19 0857 .  sodium chloride flush (NS) 0.9 % injection 3 mL, 3 mL, Intravenous, Q12H, Seals, Angela H, NP, 3 mL at 02/14/19 0858 .  sodium chloride flush (NS) 0.9 % injection 3 mL, 3 mL, Intravenous, Q12H, Seals, Angela H, NP, 3 mL at 02/14/19 0858 .  sodium chloride flush (NS) 0.9 % injection 3 mL, 3 mL, Intravenous, PRN, Seals, Angela H, NP .  spironolactone (ALDACTONE) tablet 100 mg, 100 mg, Oral, Daily, Seals, Angela H, NP, 100 mg at 02/14/19 0856 .  thiamine (VITAMIN B-1) tablet 100 mg, 100 mg, Oral, Daily, 100 mg at 02/14/19 0857 **OR** thiamine (B-1) injection 100 mg, 100 mg, Intravenous, Daily, Seals, Theo Dills, NP   Family History  Problem Relation Age of Onset  . Heart disease Father      Social History   Tobacco Use  . Smoking status: Current Every Day Smoker    Packs/day: 0.50    Years: 20.00    Pack years: 10.00    Types: Cigarettes  . Smokeless tobacco: Never Used  Substance Use Topics  . Alcohol use: Yes    Comment: last drink 02/12/19  . Drug use: Not Currently    Allergies as of 02/13/2019 - Review Complete 02/13/2019  Allergen Reaction Noted  . Wool alcohol [lanolin] Rash 02/05/2019    Review of Systems:    All systems reviewed and negative except where noted in HPI.   Physical Exam:  Vital signs in last 24 hours: Temp:  [98.3 F (36.8 C)-98.6 F (37 C)] 98.3 F (36.8 C) (05/24 1156) Pulse Rate:  [71-82] 77 (05/24 1156) Resp:  [18-23] 20 (05/24 0502) BP: (121-145)/(67-81) 122/75 (05/24 1156) SpO2:  [96 %-99 %] 98 % (05/24 1156) Weight:  [100 kg] 100 kg (05/24 0502) Last BM Date: 02/13/19 General:   Pleasant, cooperative in NAD Head:  Normocephalic and atraumatic. Eyes:   Mild icterus.   Conjunctiva pink. PERRLA. Ears:  Normal auditory acuity. Neck:  Supple; no  masses or thyroidomegaly Lungs: Respirations even and unlabored. Lungs clear to auscultation bilaterally.   No wheezes, crackles, or rhonchi.  Heart:  Regular rate and rhythm;  Without murmur, clicks, rubs or gallops Abdomen:  Soft, severely distended, tympanic to percussion in midabdomen, dull in bilateral flanks, periumbilical hernia, nontender. Normal bowel sounds.  No rebound or guarding.  Rectal:  Not performed. Msk:  Symmetrical without gross deformities.  Strength   Extremities: 3+ edema, no cyanosis or clubbing. Neurologic:  Alert and oriented x3;  grossly normal neurologically. Skin:  Intact without significant lesions or rashes. Psych:  Alert and cooperative. Normal affect.  LAB RESULTS: CBC Latest Ref Rng & Units 02/14/2019 02/13/2019 02/06/2019  WBC 4.0 - 10.5 K/uL 8.2 8.8 9.0  Hemoglobin 13.0 - 17.0 g/dL 10.2(L) 10.5(L) 10.0(L)  Hematocrit 39.0 -  52.0 % 28.8(L) 29.5(L) 28.7(L)  Platelets 150 - 400 K/uL 85(L) 91(L) 89(L)    BMET BMP Latest Ref Rng & Units 02/14/2019 02/13/2019 02/06/2019  Glucose 70 - 99 mg/dL 138(H) 134(H) 116(H)  BUN 6 - 20 mg/dL _0 Creatinine 0.61 - 1.24 mg/dL 0.75 0.75 0.69  Sodium 135 - 145 mmol/L 131(L) 129(L) 128(L)  Potassium 3.5 - 5.1 mmol/L 3.7 3.7 3.7  Chloride 98 - 111 mmol/L 103 100 102  CO2 22 - 32 mmol/L 23 21(L) 22  Calcium 8.9 - 10.3 mg/dL 7.6(L) 7.9(L) 7.7(L)    LFT Hepatic Function Latest Ref Rng & Units 02/13/2019 02/06/2019 02/05/2019  Total Protein 6.5 - 8.1 g/dL 6.5 - 7.1  Albumin 3.5 - 5.0 g/dL 1.9(L) - 2.2(L)  AST 15 - 41 U/L 51(H) - 68(H)  ALT 0 - 44 U/L 20 - 28  Alk Phosphatase 38 - 126 U/L 103 - 138(H)  Total Bilirubin 0.3 - 1.2 mg/dL 4.6(H) 4.9(H) 5.1(H)     STUDIES: Dg Abd 1 View  Result Date: 02/14/2019 CLINICAL DATA:  Abdominal pain and distention. History of cirrhosis. EXAM: ABDOMEN - 1 VIEW COMPARISON:  None. FINDINGS: Bowel gas pattern is nonobstructive. Hazy density throughout the abdomen suggests large ascites.  No evidence of free intraperitoneal air. Visualized osseous structures are unremarkable. IMPRESSION: 1. Suspect large volume ascites. 2. Nonobstructive bowel gas pattern. Electronically Signed   By: Franki Cabot M.D.   On: 02/14/2019 17:07   Dg Chest Portable 1 View  Result Date: 02/13/2019 CLINICAL DATA:  Fluid-filled duct related to cirrhosis, shortness of breath. EXAM: PORTABLE CHEST 1 VIEW COMPARISON:  Chest x-ray dated 01/17/2019 FINDINGS: Stable cardiomegaly. Mild central pulmonary vascular congestion and bilateral interstitial prominence indicating interstitial edema. No confluent opacity to suggest a developing pneumonia. No pleural effusion or pneumothorax seen. Osseous structures about the chest are unremarkable. IMPRESSION: Cardiomegaly with central pulmonary vascular congestion and bilateral interstitial edema indicating CHF/volume overload. Electronically Signed   By: Franki Cabot M.D.   On: 02/13/2019 18:00      Impression / Plan:   Frederick Cabrera is a 53 y.o. male with decompensated alcoholic cirrhosis, with ascites multiple therapeutic paracentesis within last 5 months, esophageal varices admitted with worsening of abdominal distention secondary to ascites.   Decompensated cirrhosis with ascites Recommend therapeutic paracentesis, administer 50 g of albumin after therapeutic paracentesis Recommend aggressive diuresis Continue IV Lasix 40 mg twice daily, spironolactone 100 mg daily He might benefit from Lasix drip for 24 to 48 hours, will defer to primary If patient is not responding to diuretics, he needs to be evaluated for TIPS placement Strict low-sodium diet Abstinence from alcohol  Thank you for involving me in the care of this patient.  Will follow along with you    LOS: 1 day   Sherri Sear, MD  02/14/2019, 5:22 PM   Note: This dictation was prepared with Dragon dictation along with smaller phrase technology. Any transcriptional errors that result from this  process are unintentional.

## 2019-02-14 NOTE — Progress Notes (Signed)
Per Dr. Verdis Prime request, left voicemail with Interventional Radiology at [phone] re: stat order for paracentesis.

## 2019-02-14 NOTE — Progress Notes (Signed)
Paged Dr. Allena Katz re: Troponin at 0.03

## 2019-02-15 ENCOUNTER — Other Ambulatory Visit: Payer: Self-pay | Admitting: Gastroenterology

## 2019-02-15 LAB — BASIC METABOLIC PANEL
Anion gap: 5 (ref 5–15)
BUN: 8 mg/dL (ref 6–20)
CO2: 24 mmol/L (ref 22–32)
Calcium: 7.6 mg/dL — ABNORMAL LOW (ref 8.9–10.3)
Chloride: 103 mmol/L (ref 98–111)
Creatinine, Ser: 0.68 mg/dL (ref 0.61–1.24)
GFR calc Af Amer: >60 mL/min (ref >60)
GFR calc non Af Amer: >60 mL/min (ref >60)
Glucose, Bld: 130 mg/dL — ABNORMAL HIGH (ref 70–99)
Potassium: 3.5 mmol/L (ref 3.5–5.1)
Sodium: 132 mmol/L — ABNORMAL LOW (ref 135–145)

## 2019-02-15 LAB — AMMONIA: Ammonia: 81 umol/L — ABNORMAL HIGH (ref 9–35)

## 2019-02-15 MED ORDER — ALBUMIN HUMAN 25 % IV SOLN
50.0000 g | Freq: Once | INTRAVENOUS | Status: AC
  Administered 2019-02-15: 25 g via INTRAVENOUS
  Filled 2019-02-15: qty 200

## 2019-02-15 MED ORDER — ALBUMIN HUMAN 25 % IV SOLN
12.5000 g | Freq: Once | INTRAVENOUS | Status: DC
  Filled 2019-02-15: qty 50

## 2019-02-15 MED ORDER — SPIRONOLACTONE 25 MG PO TABS
150.0000 mg | ORAL_TABLET | Freq: Every day | ORAL | Status: DC
  Administered 2019-02-16: 09:00:00 150 mg via ORAL
  Filled 2019-02-15: qty 6

## 2019-02-15 NOTE — Progress Notes (Signed)
[facility] - [place] at [facility]                                                                                                                                                                                  Patient Demographics   Frederick Cabrera, is a 53 y.o. male, DOB - [birth year], ZOX:[id number]  Admit date - 02/13/2019   Admitting Physician Houston Siren, MD  Outpatient Primary MD for the patient is Default, Provider, MD   LOS - 1  Subjective: Patient had 5 L of ascitic fluid drained still abdomen is distended.  Review of Systems:   CONSTITUTIONAL: No documented fever. No fatigue, weakness. No weight gain, no weight loss.  EYES: No blurry or double vision.  ENT: No tinnitus. No postnasal drip. No redness of the oropharynx.  RESPIRATORY: No cough, no wheeze, no hemoptysis. No dyspnea.  CARDIOVASCULAR: No chest pain. No orthopnea. No palpitations. No syncope.  GASTROINTESTINAL: No nausea, no vomiting or diarrhea. No abdominal pain. No melena or hematochezia.  Positive abdominal distention GENITOURINARY: No dysuria or hematuria.  ENDOCRINE: No polyuria or nocturia. No heat or cold intolerance.  HEMATOLOGY: No anemia. No bruising. No bleeding.  INTEGUMENTARY: No rashes. No lesions.  MUSCULOSKELETAL: No arthritis. No swelling. No gout.  NEUROLOGIC: No numbness, tingling, or ataxia. No seizure-type activity.  PSYCHIATRIC: No anxiety. No insomnia. No ADD.    Vitals:   Vitals:   02/14/19 1809 02/14/19 1832 02/14/19 2106 02/15/19 0435  BP: (!) 145/86 135/69 127/71 127/72  Pulse: 74 70 79 77  Resp:   20 18  Temp:   98.6 F (37 C) 98.4 F (36.9 C)  TempSrc:   Oral Oral  SpO2: 97% 99% 98% 98%  Weight:      Height:        Wt Readings from Last 3 Encounters:  02/14/19 100 kg  02/05/19 97.4 kg  01/17/19 97.4 kg     Intake/Output Summary (Last 24 hours) at 02/15/2019 1117 Last data filed at 02/15/2019 8119 Gross per 24 hour  Intake 360 ml  Output 2675  ml  Net -2315 ml    Physical Exam:   GENERAL: Pleasant-appearing in no apparent distress.  HEAD, EYES, EARS, NOSE AND THROAT: Atraumatic, normocephalic. Extraocular muscles are intact. Pupils equal and reactive to light. Sclerae anicteric. No conjunctival injection. No oro-pharyngeal erythema.  NECK: Supple. There is no jugular venous distention. No bruits, no lymphadenopathy, no thyromegaly.  HEART: Regular rate and rhythm,. No murmurs, no rubs, no clicks.  LUNGS: Clear to auscultation bilaterally. No rales or rhonchi. No wheezes.  ABDOMEN: Soft, flat, nontender, positive distention tense has good bowel sounds. No hepatosplenomegaly appreciated.  EXTREMITIES: No evidence of any cyanosis, clubbing, or peripheral edema.  +2 pedal and radial pulses bilaterally.  NEUROLOGIC: The patient is alert, awake, and oriented x3 with no focal motor or sensory deficits appreciated bilaterally.  SKIN: Moist and warm with no rashes appreciated.  Psych: Not anxious, depressed LN: No inguinal LN enlargement    Antibiotics   Anti-infectives (From admission, onward)   None      Medications   Scheduled Meds: . folic acid  1 mg Oral Daily  . furosemide  40 mg Intravenous Q12H  . lactulose  20 g Oral TID  . multivitamin with minerals  1 tablet Oral Daily  . nicotine  21 mg Transdermal Daily  . pantoprazole  40 mg Oral BID AC  . propranolol  20 mg Oral BID  . sodium chloride flush  3 mL Intravenous Q12H  . sodium chloride flush  3 mL Intravenous Q12H  . spironolactone  100 mg Oral Daily  . thiamine  100 mg Oral Daily   Or  . thiamine  100 mg Intravenous Daily   Continuous Infusions: . sodium chloride 2 mL (02/15/19 1028)  . albumin human     PRN Meds:.sodium chloride, LORazepam **OR** LORazepam, polyethylene glycol, sodium chloride flush   Data Review:   Micro Results Recent Results (from the past 240 hour(s))  SARS Coronavirus 2 (CEPHEID - Performed in [facility] lab[facility] Hosp  Order     Status: None   Collection Time: 02/05/19 10:49 PM  Result Value Ref Range Status   SARS Coronavirus 2 NEGATIVE NEGATIVE Final    Comment: (NOTE) If result is NEGATIVE SARS-CoV-2 target nucleic acids are NOT DETECTED. The SARS-CoV-2 RNA is generally detectable in upper and lower  respiratory specimens during the acute phase of infection. The lowest  concentration of SARS-CoV-2 viral copies this assay can detect is 250  copies / mL. A negative result does not preclude SARS-CoV-2 infection  and should not be used as the sole basis for treatment or other  patient management decisions.  A negative result may occur with  improper specimen collection / handling, submission of specimen other  than nasopharyngeal swab, presence of viral mutation(s) within the  areas targeted by this assay, and inadequate number of viral copies  (<250 copies / mL). A negative result must be combined with clinical  observations, patient history, and epidemiological information. If result is POSITIVE SARS-CoV-2 target nucleic acids are DETECTED. The SARS-CoV-2 RNA is generally detectable in upper and lower  respiratory specimens dur ing the acute phase of infection.  Positive  results are indicative of active infection with SARS-CoV-2.  Clinical  correlation with patient history and other diagnostic information is  necessary to determine patient infection status.  Positive results do  not rule out bacterial infection or co-infection with other viruses. If result is PRESUMPTIVE POSTIVE SARS-CoV-2 nucleic acids MAY BE PRESENT.   A presumptive positive result was obtained on the submitted specimen  and confirmed on repeat testing.  While 2019 novel coronavirus  (SARS-CoV-2) nucleic acids may be present in the submitted sample  additional confirmatory testing may be necessary for epidemiological  and / or clinical management purposes  to differentiate between  SARS-CoV-2 and other Sarbecovirus currently  known to infect humans.  If clinically indicated additional testing with an alternate test  methodology [phone] is advised. The SARS-CoV-2 RNA is generally  detectable in upper and lower respiratory sp ecimens during the acute  phase of infection. The expected result is  Negative. Fact Sheet for Patients:  BoilerBrush.com.cy Fact Sheet for Healthcare Providers: https://pope.com/ This test is not yet approved or cleared by the [place] FDA and has been authorized for detection and/or diagnosis of SARS-CoV-2 by FDA under an Emergency Use Authorization (EUA).  This EUA will remain in effect (meaning this test can be used) for the duration of the COVID-19 declaration under Section 564(b)(1) of the Act, 21 U.S.C. section 360bbb-3(b)(1), unless the authorization is terminated or revoked sooner. Performed at [facility], [address] [place], [place] [address]   Body fluid culture     Status: None   Collection Time: 02/06/19 11:43 AM  Result Value Ref Range Status   Specimen Description   Final    PERITONEAL Performed at [facility], [address] [place], [place] [address]    Special Requests   Final    NONE Performed at [facility], [address] [place], [place] [address]    Gram Stain   Final    FEW WBC PRESENT, PREDOMINANTLY PMN NO ORGANISMS SEEN    Culture   Final    NO GROWTH 3 DAYS Performed at [facility], [address] [place], [place] [address]    Report Status 02/09/2019 FINAL  Final  Fungus Culture With Stain     Status: None (Preliminary result)   Collection Time: 02/06/19 11:43 AM  Result Value Ref Range Status   Fungus Stain Final report  Final    Comment: (NOTE) Performed At: [facility] [address] [place], [place] [id number] Jolene Schimke MD WH:[id number]    Fungus (Mycology) Culture PENDING  Incomplete   Fungal Source FLUID  Final     Comment: Performed at [facility], [address] [place], [place] [address]  Fungus Culture Result     Status: None   Collection Time: 02/06/19 11:43 AM  Result Value Ref Range Status   Result 1 Comment  Final    Comment: (NOTE) KOH/Calcofluor preparation:  no fungus observed. Performed At: [facility] [address] [place], [place] [id number] Jolene Schimke MD QZ:[id number]   SARS Coronavirus 2 [facility] order, Performed in [facility] hospital lab)     Status: None   Collection Time: 02/13/19  6:44 PM  Result Value Ref Range Status   SARS Coronavirus 2 NEGATIVE NEGATIVE Final    Comment: (NOTE) If result is NEGATIVE SARS-CoV-2 target nucleic acids are NOT DETECTED. The SARS-CoV-2 RNA is generally detectable in upper and lower  respiratory specimens during the acute phase of infection. The lowest  concentration of SARS-CoV-2 viral copies this assay can detect is 250  copies / mL. A negative result does not preclude SARS-CoV-2 infection  and should not be used as the sole basis for treatment or other  patient management decisions.  A negative result may occur with  improper specimen collection / handling, submission of specimen other  than nasopharyngeal swab, presence of viral mutation(s) within the  areas targeted by this assay, and inadequate number of viral copies  (<250 copies / mL). A negative result must be combined with clinical  observations, patient history, and epidemiological information. If result is POSITIVE SARS-CoV-2 target nucleic acids are DETECTED. The SARS-CoV-2 RNA is generally detectable in upper and lower  respiratory specimens dur ing the acute phase of infection.  Positive  results are indicative of active infection with SARS-CoV-2.  Clinical  correlation with patient history and other diagnostic information is  necessary to determine patient  infection status.  Positive results do  not rule out bacterial infection or co-infection  with other viruses. If result is PRESUMPTIVE POSTIVE SARS-CoV-2 nucleic acids MAY BE PRESENT.   A presumptive positive result was obtained on the submitted specimen  and confirmed on repeat testing.  While 2019 novel coronavirus  (SARS-CoV-2) nucleic acids may be present in the submitted sample  additional confirmatory testing may be necessary for epidemiological  and / or clinical management purposes  to differentiate between  SARS-CoV-2 and other Sarbecovirus currently known to infect humans.  If clinically indicated additional testing with an alternate test  methodology [phone]) is advised. The SARS-CoV-2 RNA is generally  detectable in upper and lower respiratory sp ecimens during the acute  phase of infection. The expected result is Negative. Fact Sheet for Patients:  BoilerBrush.com.cy Fact Sheet for Healthcare Providers: https://pope.com/ This test is not yet approved or cleared by the [place] FDA and has been authorized for detection and/or diagnosis of SARS-CoV-2 by FDA under an Emergency Use Authorization (EUA).  This EUA will remain in effect (meaning this test can be used) for the duration of the COVID-19 declaration under Section 564(b)(1) of the Act, 21 U.S.C. section 360bbb-3(b)(1), unless the authorization is terminated or revoked sooner. Performed at [facility], [address], [place], [place] [address]     Radiology Reports Ct Abdomen Pelvis Wo Contrast  Result Date: 01/19/2019 CLINICAL DATA:  Possible gastrointestinal bleeding. EXAM: CT ABDOMEN AND PELVIS WITHOUT CONTRAST TECHNIQUE: Multidetector CT imaging of the abdomen and pelvis was performed following the standard protocol without IV contrast. COMPARISON:  None. FINDINGS: Lower chest: Minimal bilateral pleural effusions are noted. Right middle lobe opacity is noted concerning for possible pneumonia or atelectasis. Hepatobiliary: No gallstones  or biliary dilatation is noted. Lobular hepatic contour is noted suggesting cirrhosis. Mild ascites is noted around the liver. Pancreas: Unremarkable. No pancreatic ductal dilatation or surrounding inflammatory changes. Spleen: Normal in size without focal abnormality. Minimal amount of ascites is noted around the spleen. Adrenals/Urinary Tract: Adrenal glands are unremarkable. Kidneys are normal, without renal calculi, focal lesion, or hydronephrosis. Bladder is unremarkable. Stomach/Bowel: Stomach is within normal limits. Appendix appears normal. No evidence of bowel wall thickening, distention, or inflammatory changes. Vascular/Lymphatic: Aortic atherosclerosis. No enlarged abdominal or pelvic lymph nodes. Reproductive: Prostate is unremarkable. Other: Minimal ascites is noted in the pelvis pericolic gutters and around the liver and spleen. Musculoskeletal: No acute or significant osseous findings. IMPRESSION: Hepatic cirrhosis is noted. Minimal ascites is noted. No evidence of hemorrhage is noted. Minimal bilateral pleural effusions are noted with adjacent subsegmental atelectasis. Right middle lobe opacity is noted concerning for possible pneumonia or atelectasis. Aortic Atherosclerosis (ICD10-I70.0). Electronically Signed   By: Lupita Raider M.D.   On: 01/19/2019 07:47   Dg Abd 1 View  Result Date: 02/14/2019 CLINICAL DATA:  Abdominal pain and distention. History of cirrhosis. EXAM: ABDOMEN - 1 VIEW COMPARISON:  None. FINDINGS: Bowel gas pattern is nonobstructive. Hazy density throughout the abdomen suggests large ascites. No evidence of free intraperitoneal air. Visualized osseous structures are unremarkable. IMPRESSION: 1. Suspect large volume ascites. 2. Nonobstructive bowel gas pattern. Electronically Signed   By: Bary Cormick M.D.   On: 02/14/2019 17:07   US Paracentesis  Result Date: 02/14/2019 INDICATION: History of alcoholic cirrhosis with recurrent symptomatic intra-abdominal ascites.  Please perform ultrasound-guided paracentesis for therapeutic purposes. EXAM: ULTRASOUND GUIDED  PARACENTESIS MEDICATIONS: None. COMPLICATIONS: None immediate. PROCEDURE: Informed written consent was obtained from the patient after  a discussion of the risks, benefits and alternatives to treatment. A timeout was performed prior to the initiation of the procedure. Initial ultrasound scanning demonstrates a large amount of ascites within the right lower abdominal quadrant. The right lower abdomen was prepped and draped in the usual sterile fashion. 1% lidocaine with epinephrine was used for local anesthesia. An ultrasound image was saved for documentation purposes. An 8 Fr Safe-T-Centesis catheter was introduced. The paracentesis was performed. The catheter was removed and a dressing was applied. The patient tolerated the procedure well without immediate post procedural complication. FINDINGS: A total of approximately 5 L of serous fluid was removed. IMPRESSION: Successful ultrasound-guided paracentesis yielding 5 liters of peritoneal fluid. Electronically Signed   By: Elige Ko   On: 02/14/2019 18:39   US Paracentesis  Result Date: 02/06/2019 INDICATION: History of alcoholic cirrhosis with recurrent symptomatic intra-abdominal ascites. Please perform ultrasound-guided paracentesis for diagnostic and therapeutic purposes. Max paracentesis volume of 5 L. EXAM: ULTRASOUND-GUIDED PARACENTESIS COMPARISON:  CT abdomen pelvis-01/19/2019; ultrasound-guided paracentesis-01/18/2019, yielding 5 L of peritoneal fluid. MEDICATIONS: None. COMPLICATIONS: None immediate. TECHNIQUE: Informed written consent was obtained from the patient after a discussion of the risks, benefits and alternatives to treatment. A timeout was performed prior to the initiation of the procedure. Initial ultrasound scanning demonstrates a large amount of ascites within the right lower abdominal quadrant. The right lower abdomen was prepped and draped  in the usual sterile fashion. 1% lidocaine with epinephrine was used for local anesthesia. An ultrasound image was saved for documentation purposed. An 8 Fr Safe-T-Centesis catheter was introduced. The paracentesis was performed. The catheter was removed and a dressing was applied. The patient tolerated the procedure well without immediate post procedural complication. FINDINGS: A total of approximately 5 liters of serous fluid was removed. Samples were sent to the laboratory as requested by the clinical team. IMPRESSION: Successful ultrasound-guided paracentesis yielding 5 liters of peritoneal fluid. Electronically Signed   By: Simonne Come M.D.   On: 02/06/2019 12:28   US Paracentesis  Result Date: 01/18/2019 CLINICAL DATA:  Upper GI bleed, cirrhosis, ascites EXAM: ULTRASOUND GUIDED PARACENTESIS TECHNIQUE: The procedure, risks (including but not limited to bleeding, infection, organ damage ), benefits, and alternatives were explained to the patient. Questions regarding the procedure were encouraged and answered. The patient understands and consents to the procedure. Survey ultrasound of the abdomen was performed and an appropriate skin entry site in the right upper abdomen was selected. Skin site was marked, prepped with chlorhexadine, draped in usual sterile fashion, and infiltrated locally with 1% lidocaine. A Safe-T-Centesis needle was advanced into the peritoneal space until fluid could be aspirated. The sheath was advanced and the needle removed. 5 L of dark yellow ascites were aspirated. The patient tolerated the procedure well. COMPLICATIONS: COMPLICATIONS none IMPRESSION: Technically successful ultrasound guided paracentesis, removing 5 L ascites. Electronically Signed   By: Corlis Leak M.D.   On: 01/18/2019 12:45   Dg Chest Portable 1 View  Result Date: 02/13/2019 CLINICAL DATA:  Fluid-filled duct related to cirrhosis, shortness of breath. EXAM: PORTABLE CHEST 1 VIEW COMPARISON:  Chest x-ray dated  01/17/2019 FINDINGS: Stable cardiomegaly. Mild central pulmonary vascular congestion and bilateral interstitial prominence indicating interstitial edema. No confluent opacity to suggest a developing pneumonia. No pleural effusion or pneumothorax seen. Osseous structures about the chest are unremarkable. IMPRESSION: Cardiomegaly with central pulmonary vascular congestion and bilateral interstitial edema indicating CHF/volume overload. Electronically Signed   By: Bary Torey M.D.   On: 02/13/2019 18:00  Dg Chest Portable 1 View  Result Date: 01/17/2019 CLINICAL DATA:  Shortness of breath onset last night. Smoker. PORTABLE CHEST 1 VIEW COMPARISON:  None. FINDINGS: Cardiomegaly. No consolidation or effusion. Mild prominence of the interstitium. BILATERAL pulmonary opacities could represent edema or infection. No pneumothorax. Bones unremarkable. IMPRESSION: Cardiomegaly. BILATERAL pulmonary opacities could represent edema or infection. Electronically Signed   By: Elsie Stain M.D.   On: 01/17/2019 07:08   [place] Ekg Site Rite  Result Date: 01/17/2019 If Site Rite image not attached, placement could not be confirmed due to current cardiac rhythm.    CBC Recent Labs  Lab 02/13/19 1735 02/14/19 0252  WBC 8.8 8.2  HGB 10.5* 10.2*  HCT 29.5* 28.8*  PLT 91* 85*  MCV 99.3 99.0  MCH 35.4* 35.1*  MCHC 35.6 35.4  RDW 15.2 15.2  LYMPHSABS 1.8  --   MONOABS 1.3*  --   EOSABS 0.3  --   BASOSABS 0.1  --     Chemistries  Recent Labs  Lab 02/13/19 1735 02/14/19 0252 02/15/19 0421  NA 129* 131* 132*  K 3.7 3.7 3.5  CL 100 103 103  CO2 21* 23 24  GLUCOSE 134* 138* 130*  BUN 7 8 8   CREATININE 0.75 0.75 0.68  CALCIUM 7.9* 7.6* 7.6*  AST 51*  --   --   ALT 20  --   --   ALKPHOS 103  --   --   BILITOT 4.6*  --   --    ------------------------------------------------------------------------------------------------------------------ estimated creatinine clearance is 117.5 mL/min (by C-G formula  based on SCr of 0.68 mg/dL). ------------------------------------------------------------------------------------------------------------------ No results for input(s): HGBA1C in the last 72 hours. ------------------------------------------------------------------------------------------------------------------ No results for input(s): CHOL, HDL, LDLCALC, TRIG, CHOLHDL, LDLDIRECT in the last 72 hours. ------------------------------------------------------------------------------------------------------------------ No results for input(s): TSH, T4TOTAL, T3FREE, THYROIDAB in the last 72 hours.  Invalid input(s): FREET3 ------------------------------------------------------------------------------------------------------------------ No results for input(s): VITAMINB12, FOLATE, FERRITIN, TIBC, IRON, RETICCTPCT in the last 72 hours.  Coagulation profile Recent Labs  Lab 02/14/19 0252  INR 1.8*    No results for input(s): DDIMER in the last 72 hours.  Cardiac Enzymes Recent Labs  Lab 02/13/19 1735 02/14/19 0252 02/14/19 0916  TROPONINI <0.03 <0.03 0.03*   ------------------------------------------------------------------------------------------------------------------ Invalid input(s): POCBNP    Assessment & Plan    1.  Alcoholic cirrhosis with ascites - We will have to have it re-drained today we will give IV albumin as well -Continue oral spironolactone and Lasix IV twice daily  2. acute on chronic diastolic CHF --Lasix 40 mg IV twice daily - Continue p.o. spironolactone - echo on 11/18/2018 with EF 60 to 65% - O2 per nasal cannula as needed currently off of oxygen --Likely abnormal troponin due to demand ischemia   3.  Hyperammonemia -ammonia level 93 - Lactulose 20 mg p.o. 3 times daily - Recheck ammonia level tomorrow morning -Patient's mental status is normal  4.  Chronic hyponatremia - Sodium is 132- stable  5.  EtOH abuse - CIWA protocol initiated, no  evidence of withdrawal       Code Status Orders  (From admission, onward)         Start     Ordered   02/13/19 2040  Full code  Continuous     02/13/19 2039        Code Status History    Date Active Date Inactive Code Status Order ID Comments User Context   02/06/2019 0031 02/06/2019 1835 Full Code [id number]  Oralia Manis, MD  Inpatient   01/17/2019 0930 01/23/2019 1905 Full Code [id number]  Ramonita Lab, MD ED   01/17/2019 0810 01/17/2019 0930 Full Code [id number]  Nita Sickle, MD ED           Consults none  DVT Prophylaxis SCDs  Lab Results  Component Value Date   PLT 85 (L) 02/14/2019     Time Spent in minutes   35 minutes  Greater than 50% of time spent in care coordination and counseling patient regarding the condition and plan of care.   Auburn Bilberry M.D on 02/15/2019 at 11:17 AM  Between 7am to 6pm - Pager - [phone]  After 6pm go to [email or web address] - Social research officer, government  [employer]   Office  [phone]

## 2019-02-15 NOTE — Consult Note (Signed)
[facility]  CARDIOLOGY CONSULT NOTE  Patient ID: Frederick Cabrera MRN: [id number] DOB/AGE: 52/09/1965 52 y.o.  Admit date: 02/13/2019 Ref[facility] Physician [facility] Primary [facility]  Primary Cardiologist  Reason for Consultation congestive heart failure  HPI: 53 year old woman referred for evaluation of congestive heart failure.  Patient has known history of EtOH abuse, EtOH cirrhosis, and ascites.  He was admitted on/23/2020 with increased abdominal girth with associated shortness of breath and ongoing alcohol consumption.  Patient underwent repeat paracentesis with overall general improvement with less shortness of breath.  Admission labs notable for borderline elevated BNP of 126.  2D echocardiogram 11/18/2018 revealed normal left ventricular function, with LVEF of 60-65%, with evidence for grade 2 diastolic dysfunction.  Review of systems complete and found to be negative unless listed above     Past Medical History:  Diagnosis Date  . Alcoholic cirrhosis of liver with ascites [facility](HCC)     Past Surgical History:  Procedure Laterality Date  . ESOPHAGOGASTRODUODENOSCOPY (EGD) WITH PROPOFOL N/A 01/17/2019   Procedure: ESOPHAGOGASTRODUODENOSCOPY (EGD) WITH PROPOFOL;  Surgeon: Toledo, Boykin Nearingeodoro K, MD;  Location: [facility] ENDOSCOPY;  Service: Gastroenterology;  Laterality: N/A;    Medications Prior to Admission  Medication Sig Dispense Refill Last Dose  . furosemide (LASIX) 40 MG tablet Take 40 mg by mouth 2 (two) times daily.   02/13/2019 at Unknown time  . nicotine (NICODERM CQ - DOSED IN MG/24 HOURS) 21 mg/24hr patch Place 1 patch (21 mg total) onto the skin daily. 28 patch 0 02/13/2019 at Unknown time  . pantoprazole (PROTONIX) 40 MG tablet Take 1 tablet (40 mg total) by mouth 2 (two) times daily before a meal. 60 tablet 2 02/13/2019 at Unknown time  . propranolol (INDERAL) 20 MG tablet Take 20 mg by mouth 2 (two) times daily.   02/13/2019 at Unknown time  . spironolactone (ALDACTONE) 100 MG tablet  Take 100 mg by mouth daily.   02/13/2019 at Unknown time   Social History   Socioeconomic History  . Marital status: Widowed    Spouse name: Not on file  . Number of children: Not on file  . Years of education: Not on file  . Highest education level: Not on file  Occupational History  . Not on file  Social Needs  . Financial resource strain: Not on file  . Food insecurity:    Worry: Not on file    Inability: Not on file  . Transportation needs:    Medical: Not on file    Non-medical: Not on file  Tobacco Use  . Smoking status: Current Every Day Smoker    Packs/day: 0.50    Years: 20.00    Pack years: 10.00    Types: Cigarettes  . Smokeless tobacco: Never Used  Substance and Sexual Activity  . Alcohol use: Yes    Comment: last drink 02/12/19  . Drug use: Not Currently  . Sexual activity: Not on file  Lifestyle  . Physical activity:    Days per week: Not on file    Minutes per session: Not on file  . Stress: Not on file  Relationships  . Social connections:    Talks on phone: Not on file    Gets together: Not on file    Attends religious service: Not on file    Active member of club or organization: Not on file    Attends meetings of clubs or organizations: Not on file    Relationship status: Not on file  . Intimate partner violence:  Fear of current or ex partner: Not on file    Emotionally abused: Not on file    Physically abused: Not on file    Forced sexual activity: Not on file  Other Topics Concern  . Not on file  Social History Narrative  . Not on file    Family History  Problem Relation Age of Onset  . Heart disease Father       Review of systems complete and found to be negative unless listed above      PHYSICAL EXAM  General: Well developed, well nourished, in no acute distress HEENT:  Normocephalic and atramatic Neck:  No JVD.  Lungs: Clear bilaterally to auscultation and percussion. Heart: HRRR . Normal S1 and S2 without gallops or  murmurs.  Abdomen: Bowel sounds are positive, abdomen soft and non-tender  Msk:  Back normal, normal gait. Normal strength and tone for age. Extremities: No clubbing, cyanosis or edema.   Neuro: Alert and oriented X 3. Psych:  Good affect, responds appropriately  Labs:   Lab Results  Component Value Date   WBC 8.2 02/14/2019   HGB 10.2 (L) 02/14/2019   HCT 28.8 (L) 02/14/2019   MCV 99.0 02/14/2019   PLT 85 (L) 02/14/2019    Recent Labs  Lab 02/13/19 1735  02/15/19 0421  NA 129*   < > 132*  K 3.7   < > 3.5  CL 100   < > 103  CO2 21*   < > 24  BUN 7   < > 8  CREATININE 0.75   < > 0.68  CALCIUM 7.9*   < > 7.6*  PROT 6.5  --   --   BILITOT 4.6*  --   --   ALKPHOS 103  --   --   ALT 20  --   --   AST 51*  --   --   GLUCOSE 134*   < > 130*   < > = values in this interval not displayed.   Lab Results  Component Value Date   TROPONINI 0.03 (HH) 02/14/2019   No results found for: CHOL No results found for: HDL No results found for: LDLCALC No results found for: TRIG No results found for: CHOLHDL No results found for: LDLDIRECT    Radiology: Ct Abdomen Pelvis Wo Contrast  Result Date: 01/19/2019 CLINICAL DATA:  Possible gastrointestinal bleeding. EXAM: CT ABDOMEN AND PELVIS WITHOUT CONTRAST TECHNIQUE: Multidetector CT imaging of the abdomen and pelvis was performed following the standard protocol without IV contrast. COMPARISON:  None. FINDINGS: Lower chest: Minimal bilateral pleural effusions are noted. Right middle lobe opacity is noted concerning for possible pneumonia or atelectasis. Hepatobiliary: No gallstones or biliary dilatation is noted. Lobular hepatic contour is noted suggesting cirrhosis. Mild ascites is noted around the liver. Pancreas: Unremarkable. No pancreatic ductal dilatation or surrounding inflammatory changes. Spleen: Normal in size without focal abnormality. Minimal amount of ascites is noted around the spleen. Adrenals/Urinary Tract: Adrenal glands are  unremarkable. Kidneys are normal, without renal calculi, focal lesion, or hydronephrosis. Bladder is unremarkable. Stomach/Bowel: Stomach is within normal limits. Appendix appears normal. No evidence of bowel wall thickening, distention, or inflammatory changes. Vascular/Lymphatic: Aortic atherosclerosis. No enlarged abdominal or pelvic lymph nodes. Reproductive: Prostate is unremarkable. Other: Minimal ascites is noted in the pelvis pericolic gutters and around the liver and spleen. Musculoskeletal: No acute or significant osseous findings. IMPRESSION: Hepatic cirrhosis is noted. Minimal ascites is noted. No evidence of hemorrhage is noted. Minimal bilateral  pleural effusions are noted with adjacent subsegmental atelectasis. Right middle lobe opacity is noted concerning for possible pneumonia or atelectasis. Aortic Atherosclerosis (ICD10-I70.0). Electronically Signed   By: Lupita Raider M.D.   On: 01/19/2019 07:47   Dg Abd 1 View  Result Date: 02/14/2019 CLINICAL DATA:  Abdominal pain and distention. History of cirrhosis. EXAM: ABDOMEN - 1 VIEW COMPARISON:  None. FINDINGS: Bowel gas pattern is nonobstructive. Hazy density throughout the abdomen suggests large ascites. No evidence of free intraperitoneal air. Visualized osseous structures are unremarkable. IMPRESSION: 1. Suspect large volume ascites. 2. Nonobstructive bowel gas pattern. Electronically Signed   By: Bary Deforest M.D.   On: 02/14/2019 17:07   US Paracentesis  Result Date: 02/14/2019 INDICATION: History of alcoholic cirrhosis with recurrent symptomatic intra-abdominal ascites. Please perform ultrasound-guided paracentesis for therapeutic purposes. EXAM: ULTRASOUND GUIDED  PARACENTESIS MEDICATIONS: None. COMPLICATIONS: None immediate. PROCEDURE: Informed written consent was obtained from the patient after a discussion of the risks, benefits and alternatives to treatment. A timeout was performed prior to the initiation of the procedure. Initial  ultrasound scanning demonstrates a large amount of ascites within the right lower abdominal quadrant. The right lower abdomen was prepped and draped in the usual sterile fashion. 1% lidocaine with epinephrine was used for local anesthesia. An ultrasound image was saved for documentation purposes. An 8 Fr Safe-T-Centesis catheter was introduced. The paracentesis was performed. The catheter was removed and a dressing was applied. The patient tolerated the procedure well without immediate post procedural complication. FINDINGS: A total of approximately 5 L of serous fluid was removed. IMPRESSION: Successful ultrasound-guided paracentesis yielding 5 liters of peritoneal fluid. Electronically Signed   By: Elige Ko   On: 02/14/2019 18:39   US Paracentesis  Result Date: 02/06/2019 INDICATION: History of alcoholic cirrhosis with recurrent symptomatic intra-abdominal ascites. Please perform ultrasound-guided paracentesis for diagnostic and therapeutic purposes. Max paracentesis volume of 5 L. EXAM: ULTRASOUND-GUIDED PARACENTESIS COMPARISON:  CT abdomen pelvis-01/19/2019; ultrasound-guided paracentesis-01/18/2019, yielding 5 L of peritoneal fluid. MEDICATIONS: None. COMPLICATIONS: None immediate. TECHNIQUE: Informed written consent was obtained from the patient after a discussion of the risks, benefits and alternatives to treatment. A timeout was performed prior to the initiation of the procedure. Initial ultrasound scanning demonstrates a large amount of ascites within the right lower abdominal quadrant. The right lower abdomen was prepped and draped in the usual sterile fashion. 1% lidocaine with epinephrine was used for local anesthesia. An ultrasound image was saved for documentation purposed. An 8 Fr Safe-T-Centesis catheter was introduced. The paracentesis was performed. The catheter was removed and a dressing was applied. The patient tolerated the procedure well without immediate post procedural complication.  FINDINGS: A total of approximately 5 liters of serous fluid was removed. Samples were sent to the laboratory as requested by the clinical team. IMPRESSION: Successful ultrasound-guided paracentesis yielding 5 liters of peritoneal fluid. Electronically Signed   By: Simonne Come M.D.   On: 02/06/2019 12:28   US Paracentesis  Result Date: 01/18/2019 CLINICAL DATA:  Upper GI bleed, cirrhosis, ascites EXAM: ULTRASOUND GUIDED PARACENTESIS TECHNIQUE: The procedure, risks (including but not limited to bleeding, infection, organ damage ), benefits, and alternatives were explained to the patient. Questions regarding the procedure were encouraged and answered. The patient understands and consents to the procedure. Survey ultrasound of the abdomen was performed and an appropriate skin entry site in the right upper abdomen was selected. Skin site was marked, prepped with chlorhexadine, draped in usual sterile fashion, and infiltrated locally with 1% lidocaine.  A Safe-T-Centesis needle was advanced into the peritoneal space until fluid could be aspirated. The sheath was advanced and the needle removed. 5 L of dark yellow ascites were aspirated. The patient tolerated the procedure well. COMPLICATIONS: COMPLICATIONS none IMPRESSION: Technically successful ultrasound guided paracentesis, removing 5 L ascites. Electronically Signed   By: Corlis Leak M.D.   On: 01/18/2019 12:45   Dg Chest Portable 1 View  Result Date: 02/13/2019 CLINICAL DATA:  Fluid-filled duct related to cirrhosis, shortness of breath. EXAM: PORTABLE CHEST 1 VIEW COMPARISON:  Chest x-ray dated 01/17/2019 FINDINGS: Stable cardiomegaly. Mild central pulmonary vascular congestion and bilateral interstitial prominence indicating interstitial edema. No confluent opacity to suggest a developing pneumonia. No pleural effusion or pneumothorax seen. Osseous structures about the chest are unremarkable. IMPRESSION: Cardiomegaly with central pulmonary vascular congestion  and bilateral interstitial edema indicating CHF/volume overload. Electronically Signed   By: Bary Vernis M.D.   On: 02/13/2019 18:00   Dg Chest Portable 1 View  Result Date: 01/17/2019 CLINICAL DATA:  Shortness of breath onset last night. Smoker. PORTABLE CHEST 1 VIEW COMPARISON:  None. FINDINGS: Cardiomegaly. No consolidation or effusion. Mild prominence of the interstitium. BILATERAL pulmonary opacities could represent edema or infection. No pneumothorax. Bones unremarkable. IMPRESSION: Cardiomegaly. BILATERAL pulmonary opacities could represent edema or infection. Electronically Signed   By: Elsie Stain M.D.   On: 01/17/2019 07:08   [place] Ekg Site Rite  Result Date: 01/17/2019 If Site Rite image not attached, placement could not be confirmed due to current cardiac rhythm.   EKG: Sinus rhythm  ASSESSMENT AND PLAN:   1.  Mild chronic diastolic congestive heart failure, with normal left ventricular function, grade 2 diastolic dysfunction observed on echocardiogram, BNP mildly elevated 126 2.  Ascites, secondary to EtOH cirrhosis and portal hypertension, improved after paracentesis  Recommendations  1.  Agree with overall current therapy 2.  Continue maintenance furosemide to 20-40 mg daily as outpatient 3.  Continue spironolactone 4.  Abstain from EtOH 5.  No further cardiac diagnostics at this time  Sign off for now, please call if any questions  Signed: Marcina Millard MD,PhD, [facility] 02/15/2019, 12:46 PM

## 2019-02-15 NOTE — Progress Notes (Signed)
Spoke with patient's brother to update on patient's condition and plan of care.

## 2019-02-15 NOTE — Progress Notes (Signed)
Arlyss Repress, MD [address]  [address]  [place], [place] [address]  Main: [phone]  Fax: [phone] Pager: [phone]   Subjective: Patient underwent paracentesis yesterday, feels better today, states he still significantly distended.  He had one bowel movement yesterday.  He denies abdominal pain, nausea or vomiting.  He is tolerating p.o. well.  He is asking when he could go home.  He also reports that he has been urinating a lot on Lasix, swelling of legs has improved   Objective: Vital signs in last 24 hours: Vitals:   02/14/19 1832 02/14/19 2106 02/15/19 0435 02/15/19 1250  BP: 135/69 127/71 127/72 108/65  Pulse: 70 79 77 71  Resp:  20 18 16   Temp:  98.6 F (37 C) 98.4 F (36.9 C) 98.5 F (36.9 C)  TempSrc:  Oral Oral Oral  SpO2: 99% 98% 98% 99%  Weight:      Height:       Weight change:   Intake/Output Summary (Last 24 hours) at 02/15/2019 1545 Last data filed at 02/15/2019 1300 Gross per 24 hour  Intake 600 ml  Output 1700 ml  Net -1100 ml     Exam: Heart:: Regular rate and rhythm, S1S2 present or without murmur or extra heart sounds Lungs: normal and clear to auscultation Abdomen: Soft, diffusely distended, dull to percussion, periumbilical hernia, nontender   Lab Results: CBC Latest Ref Rng & Units 02/14/2019 02/13/2019 02/06/2019  WBC 4.0 - 10.5 K/uL 8.2 8.8 9.0  Hemoglobin 13.0 - 17.0 g/dL 10.2(L) 10.5(L) 10.0(L)  Hematocrit 39.0 - 52.0 % 28.8(L) 29.5(L) 28.7(L)  Platelets 150 - 400 K/uL 85(L) 91(L) 89(L)   CMP Latest Ref Rng & Units 02/15/2019 02/14/2019 02/13/2019  Glucose 70 - 99 mg/dL 655(V) 748(O) 707(E)  BUN 6 - 20 mg/dL 8 8 7   Creatinine 0.61 - 1.24 mg/dL 6.75 4.49 2.01  Sodium 135 - 145 mmol/L 132(L) 131(L) 129(L)  Potassium 3.5 - 5.1 mmol/L 3.5 3.7 3.7  Chloride 98 - 111 mmol/L 103 103 100  CO2 22 - 32 mmol/L 24 23 21(L)  Calcium 8.9 - 10.3 mg/dL 7.6(L) 7.6(L) 7.9(L)  Total Protein 6.5 - 8.1 g/dL - - 6.5  Total Bilirubin  0.3 - 1.2 mg/dL - - 4.6(H)  Alkaline Phos 38 - 126 U/L - - 103  AST 15 - 41 U/L - - 51(H)  ALT 0 - 44 U/L - - 20    Micro Results: Recent Results (from the past 240 hour(s))  SARS Coronavirus 2 (CEPHEID - Performed in [facility] [place] [facility] hospital lab), Hosp Order     Status: None   Collection Time: 02/05/19 10:49 PM  Result Value Ref Range Status   SARS Coronavirus 2 NEGATIVE NEGATIVE Final    Comment: (NOTE) If result is NEGATIVE SARS-CoV-2 target nucleic acids are NOT DETECTED. The SARS-CoV-2 RNA is generally detectable in upper and lower  respiratory specimens during the acute phase of infection. The lowest  concentration of SARS-CoV-2 viral copies this assay can detect is 250  copies / mL. A negative result does not preclude SARS-CoV-2 infection  and should not be used as the sole basis for treatment or other  patient management decisions.  A negative result may occur with  improper specimen collection / handling, submission of specimen other  than nasopharyngeal swab, presence of viral mutation(s) within the  areas targeted by this assay, and inadequate number of viral copies  (<250 copies / mL). A negative result must be combined with  clinical  observations, patient history, and epidemiological information. If result is POSITIVE SARS-CoV-2 target nucleic acids are DETECTED. The SARS-CoV-2 RNA is generally detectable in upper and lower  respiratory specimens dur ing the acute phase of infection.  Positive  results are indicative of active infection with SARS-CoV-2.  Clinical  correlation with patient history and other diagnostic information is  necessary to determine patient infection status.  Positive results do  not rule out bacterial infection or co-infection with other viruses. If result is PRESUMPTIVE POSTIVE SARS-CoV-2 nucleic acids MAY BE PRESENT.   A presumptive positive result was obtained on the submitted specimen  and confirmed on repeat testing.  While 2019 novel  coronavirus  (SARS-CoV-2) nucleic acids may be present in the submitted sample  additional confirmatory testing may be necessary for epidemiological  and / or clinical management purposes  to differentiate between  SARS-CoV-2 and other Sarbecovirus currently known to infect humans.  If clinically indicated additional testing with an alternate test  methodology [phone]) is advised. The SARS-CoV-2 RNA is generally  detectable in upper and lower respiratory sp ecimens during the acute  phase of infection. The expected result is Negative. Fact Sheet for Patients:  BoilerBrush.com.cy Fact Sheet for Healthcare Providers: https://pope.com/ This test is not yet approved or cleared by the [place] FDA and has been authorized for detection and/or diagnosis of SARS-CoV-2 by FDA under an Emergency Use Authorization (EUA).  This EUA will remain in effect (meaning this test can be used) for the duration of the COVID-19 declaration under Section 564(b)(1) of the Act, 21 U.S.C. section 360bbb-3(b)(1), unless the authorization is terminated or revoked sooner. Performed at [facility], [address] [place], [place] [address]   Body fluid culture     Status: None   Collection Time: 02/06/19 11:43 AM  Result Value Ref Range Status   Specimen Description   Final    PERITONEAL Performed at [facility], [address] [place], [place] [address]    Special Requests   Final    NONE Performed at [facility], [address] [place], [place] [address]    Gram Stain   Final    FEW WBC PRESENT, PREDOMINANTLY PMN NO ORGANISMS SEEN    Culture   Final    NO GROWTH 3 DAYS Performed at [facility], [address], [place], [place] [address]    Report Status 02/09/2019 FINAL  Final  Fungus Culture With Stain     Status: None (Preliminary result)   Collection Time: 02/06/19 11:43 AM  Result Value Ref Range  Status   Fungus Stain Final report  Final    Comment: (NOTE) Performed At: [facility] [address] [place], [place] [id number] Jolene Schimke MD [id number]    Fungus (Mycology) Culture PENDING  Incomplete   Fungal Source FLUID  Final    Comment: Performed at [facility], [address] [place], [place] [address]  Fungus Culture Result     Status: None   Collection Time: 02/06/19 11:43 AM  Result Value Ref Range Status   Result 1 Comment  Final    Comment: (NOTE) KOH/Calcofluor preparation:  no fungus observed. Performed At: [facility] [address] [place], [place] [id number] Jolene Schimke MD [id number]   SARS Coronavirus 2 [facility] order, Performed in [facility] hospital lab)     Status: None   Collection Time: 02/13/19  6:44 PM  Result Value Ref Range Status   SARS Coronavirus 2  NEGATIVE NEGATIVE Final    Comment: (NOTE) If result is NEGATIVE SARS-CoV-2 target nucleic acids are NOT DETECTED. The SARS-CoV-2 RNA is generally detectable in upper and lower  respiratory specimens during the acute phase of infection. The lowest  concentration of SARS-CoV-2 viral copies this assay can detect is 250  copies / mL. A negative result does not preclude SARS-CoV-2 infection  and should not be used as the sole basis for treatment or other  patient management decisions.  A negative result may occur with  improper specimen collection / handling, submission of specimen other  than nasopharyngeal swab, presence of viral mutation(s) within the  areas targeted by this assay, and inadequate number of viral copies  (<250 copies / mL). A negative result must be combined with clinical  observations, patient history, and epidemiological information. If result is POSITIVE SARS-CoV-2 target nucleic acids are DETECTED. The SARS-CoV-2 RNA is generally detectable in upper and lower  respiratory specimens dur ing the acute phase of infection.  Positive   results are indicative of active infection with SARS-CoV-2.  Clinical  correlation with patient history and other diagnostic information is  necessary to determine patient infection status.  Positive results do  not rule out bacterial infection or co-infection with other viruses. If result is PRESUMPTIVE POSTIVE SARS-CoV-2 nucleic acids MAY BE PRESENT.   A presumptive positive result was obtained on the submitted specimen  and confirmed on repeat testing.  While 2019 novel coronavirus  (SARS-CoV-2) nucleic acids may be present in the submitted sample  additional confirmatory testing may be necessary for epidemiological  and / or clinical management purposes  to differentiate between  SARS-CoV-2 and other Sarbecovirus currently known to infect humans.  If clinically indicated additional testing with an alternate test  methodology [phone]) is advised. The SARS-CoV-2 RNA is generally  detectable in upper and lower respiratory sp ecimens during the acute  phase of infection. The expected result is Negative. Fact Sheet for Patients:  BoilerBrush.com.cy Fact Sheet for Healthcare Providers: https://pope.com/ This test is not yet approved or cleared by the [place] FDA and has been authorized for detection and/or diagnosis of SARS-CoV-2 by FDA under an Emergency Use Authorization (EUA).  This EUA will remain in effect (meaning this test can be used) for the duration of the COVID-19 declaration under Section 564(b)(1) of the Act, 21 U.S.C. section 360bbb-3(b)(1), unless the authorization is terminated or revoked sooner. Performed at [facility], [address] [place], [place] [address]    Studies/Results: Dg Abd 1 View  Result Date: 02/14/2019 CLINICAL DATA:  Abdominal pain and distention. History of cirrhosis. EXAM: ABDOMEN - 1 VIEW COMPARISON:  None. FINDINGS: Bowel gas pattern is nonobstructive. Hazy density throughout  the abdomen suggests large ascites. No evidence of free intraperitoneal air. Visualized osseous structures are unremarkable. IMPRESSION: 1. Suspect large volume ascites. 2. Nonobstructive bowel gas pattern. Electronically Signed   By: Bary Kristain M.D.   On: 02/14/2019 17:07   US Paracentesis  Result Date: 02/14/2019 INDICATION: History of alcoholic cirrhosis with recurrent symptomatic intra-abdominal ascites. Please perform ultrasound-guided paracentesis for therapeutic purposes. EXAM: ULTRASOUND GUIDED  PARACENTESIS MEDICATIONS: None. COMPLICATIONS: None immediate. PROCEDURE: Informed written consent was obtained from the patient after a discussion of the risks, benefits and alternatives to treatment. A timeout was performed prior to the initiation of the procedure. Initial ultrasound scanning demonstrates a large amount of ascites within the right lower abdominal quadrant. The right lower abdomen was prepped and draped in the usual sterile  fashion. 1% lidocaine with epinephrine was used for local anesthesia. An ultrasound image was saved for documentation purposes. An 8 Fr Safe-T-Centesis catheter was introduced. The paracentesis was performed. The catheter was removed and a dressing was applied. The patient tolerated the procedure well without immediate post procedural complication. FINDINGS: A total of approximately 5 L of serous fluid was removed. IMPRESSION: Successful ultrasound-guided paracentesis yielding 5 liters of peritoneal fluid. Electronically Signed   By: Elige Kotel   On: 02/14/2019 18:39   Dg Chest Portable 1 View  Result Date: 02/13/2019 CLINICAL DATA:  Fluid-filled duct related to cirrhosis, shortness of breath. EXAM: PORTABLE CHEST 1 VIEW COMPARISON:  Chest x-ray dated 01/17/2019 FINDINGS: Stable cardiomegaly. Mild central pulmonary vascular congestion and bilateral interstitial prominence indicating interstitial edema. No confluent opacity to suggest a developing pneumonia. No  pleural effusion or pneumothorax seen. Osseous structures about the chest are unremarkable. IMPRESSION: Cardiomegaly with central pulmonary vascular congestion and bilateral interstitial edema indicating CHF/volume overload. Electronically Signed   By: Bary Quentavious M.D.   On: 02/13/2019 18:00   Medications:  I have reviewed the patient's current medications. Prior to Admission:  Medications Prior to Admission  Medication Sig Dispense Refill Last Dose  . furosemide (LASIX) 40 MG tablet Take 40 mg by mouth 2 (two) times daily.   02/13/2019 at Unknown time  . nicotine (NICODERM CQ - DOSED IN MG/24 HOURS) 21 mg/24hr patch Place 1 patch (21 mg total) onto the skin daily. 28 patch 0 02/13/2019 at Unknown time  . pantoprazole (PROTONIX) 40 MG tablet Take 1 tablet (40 mg total) by mouth 2 (two) times daily before a meal. 60 tablet 2 02/13/2019 at Unknown time  . propranolol (INDERAL) 20 MG tablet Take 20 mg by mouth 2 (two) times daily.   02/13/2019 at Unknown time  . spironolactone (ALDACTONE) 100 MG tablet Take 100 mg by mouth daily.   02/13/2019 at Unknown time   Scheduled: . folic acid  1 mg Oral Daily  . furosemide  40 mg Intravenous Q12H  . lactulose  20 g Oral TID  . multivitamin with minerals  1 tablet Oral Daily  . nicotine  21 mg Transdermal Daily  . pantoprazole  40 mg Oral BID AC  . propranolol  20 mg Oral BID  . sodium chloride flush  3 mL Intravenous Q12H  . sodium chloride flush  3 mL Intravenous Q12H  . [START ON 02/16/2019] spironolactone  150 mg Oral Daily  . thiamine  100 mg Oral Daily   Or  . thiamine  100 mg Intravenous Daily   Continuous: . sodium chloride 2 mL (02/15/19 1028)  . albumin human     ZOX:WRUEAV chloride, LORazepam **OR** LORazepam, polyethylene glycol, sodium chloride flush Anti-infectives (From admission, onward)   None     Scheduled Meds: . folic acid  1 mg Oral Daily  . furosemide  40 mg Intravenous Q12H  . lactulose  20 g Oral TID  . multivitamin  with minerals  1 tablet Oral Daily  . nicotine  21 mg Transdermal Daily  . pantoprazole  40 mg Oral BID AC  . propranolol  20 mg Oral BID  . sodium chloride flush  3 mL Intravenous Q12H  . sodium chloride flush  3 mL Intravenous Q12H  . spironolactone  100 mg Oral Daily  . thiamine  100 mg Oral Daily   Or  . thiamine  100 mg Intravenous Daily   Continuous Infusions: . sodium chloride 2 mL (02/15/19  1028)  . albumin human     PRN Meds:.sodium chloride, LORazepam **OR** LORazepam, polyethylene glycol, sodium chloride flush   Assessment: Active Problems:   Ascites due to alcoholic cirrhosis (HCC)   Abdominal pain  Decompensated alcoholic cirrhosis with ascites Patient is responding well to diuretics with preserved kidney function and electrolytes  Plan: Ordered for another paracentesis today due to significant abdominal distention, patient received 50 g of albumin today Ascitic fluid cytology was unremarkable 2 weeks ago Continue furosemide 40 mg IV twice daily and transition to 40 mg p.o. twice daily upon discharge Increase spironolactone to 150 mg daily Monitor kidney function and electrolytes closely Cardiology is consulted to evaluate for TIPS placement from cardiac standpoint as patient has diastolic heart failure Consult palliative care Reiterated to the patient about complete abstinence from alcohol use, strict adherence to low-sodium diet, adherence to diuretics and [facility]  Dr. Tobi BastosAnna to cover from tomorrow    LOS: 1 day   Rohini Vanga 02/15/2019, 3:45 PM

## 2019-02-15 NOTE — Progress Notes (Signed)
Updated patient's sister-in-law per patient's request.

## 2019-02-16 ENCOUNTER — Ambulatory Visit: Payer: Self-pay

## 2019-02-16 ENCOUNTER — Inpatient Hospital Stay: Payer: Medicaid Other

## 2019-02-16 DIAGNOSIS — Z7189 Other specified counseling: Secondary | ICD-10-CM

## 2019-02-16 DIAGNOSIS — J810 Acute pulmonary edema: Secondary | ICD-10-CM

## 2019-02-16 DIAGNOSIS — Z515 Encounter for palliative care: Secondary | ICD-10-CM

## 2019-02-16 LAB — BODY FLUID CELL COUNT WITH DIFFERENTIAL
Eos, Fluid: 0 %
Lymphs, Fluid: 34 %
Monocyte-Macrophage-Serous Fluid: 61 %
Neutrophil Count, Fluid: 5 %
Total Nucleated Cell Count, Fluid: 152 cu mm

## 2019-02-16 LAB — BASIC METABOLIC PANEL
Anion gap: 5 (ref 5–15)
BUN: 6 mg/dL (ref 6–20)
CO2: 25 mmol/L (ref 22–32)
Calcium: 7.9 mg/dL — ABNORMAL LOW (ref 8.9–10.3)
Chloride: 102 mmol/L (ref 98–111)
Creatinine, Ser: 0.6 mg/dL — ABNORMAL LOW (ref 0.61–1.24)
GFR calc Af Amer: >60 mL/min (ref >60)
GFR calc non Af Amer: >60 mL/min (ref >60)
Glucose, Bld: 107 mg/dL — ABNORMAL HIGH (ref 70–99)
Potassium: 3.5 mmol/L (ref 3.5–5.1)
Sodium: 132 mmol/L — ABNORMAL LOW (ref 135–145)

## 2019-02-16 LAB — CBC
HCT: 29.2 % — ABNORMAL LOW (ref 39.0–52.0)
Hemoglobin: 10.2 g/dL — ABNORMAL LOW (ref 13.0–17.0)
MCH: 34.6 pg — ABNORMAL HIGH (ref 26.0–34.0)
MCHC: 34.9 g/dL (ref 30.0–36.0)
MCV: 99 fL (ref 80.0–100.0)
Platelets: 77 10*3/uL — ABNORMAL LOW (ref 150–400)
RBC: 2.95 MIL/uL — ABNORMAL LOW (ref 4.22–5.81)
RDW: 14.7 % (ref 11.5–15.5)
WBC: 7.5 10*3/uL (ref 4.0–10.5)
nRBC: 0 % (ref 0.0–0.2)

## 2019-02-16 LAB — LACTATE DEHYDROGENASE, PLEURAL OR PERITONEAL FLUID: LD, Fluid: 43 U/L — ABNORMAL HIGH (ref 3–23)

## 2019-02-16 LAB — ALBUMIN, PLEURAL OR PERITONEAL FLUID: Albumin, Fluid: <1 g/dL

## 2019-02-16 LAB — AMMONIA: Ammonia: 68 umol/L — ABNORMAL HIGH (ref 9–35)

## 2019-02-16 MED ORDER — LACTULOSE 10 GM/15ML PO SOLN: 20.0000 g | Freq: Three times a day (TID) | ORAL | 0 refills | Status: DC

## 2019-02-16 MED ORDER — THIAMINE HCL 100 MG PO TABS: 100.0000 mg | ORAL_TABLET | Freq: Every day | ORAL | 0 refills | Status: DC

## 2019-02-16 MED ORDER — SPIRONOLACTONE 100 MG PO TABS: 200.0000 mg | ORAL_TABLET | Freq: Every day | ORAL | 0 refills | Status: DC

## 2019-02-16 MED ORDER — ADULT MULTIVITAMIN W/MINERALS CH: 1.0000 | ORAL_TABLET | Freq: Every day | ORAL | 0 refills | Status: DC

## 2019-02-16 MED ORDER — SPIRONOLACTONE 25 MG PO TABS: 200.0000 mg | ORAL_TABLET | Freq: Every day | ORAL | Status: DC

## 2019-02-16 MED ORDER — POLYETHYLENE GLYCOL 3350 17 G PO PACK: 17.0000 g | PACK | Freq: Every day | ORAL | 0 refills | Status: DC | PRN

## 2019-02-16 MED ORDER — FOLIC ACID 1 MG PO TABS: 1.0000 mg | ORAL_TABLET | Freq: Every day | ORAL | 0 refills | Status: DC

## 2019-02-16 MED ORDER — FUROSEMIDE 10 MG/ML IJ SOLN: 40.0000 mg | Freq: Every day | INTRAMUSCULAR | Status: DC

## 2019-02-16 NOTE — Progress Notes (Signed)
Wyline Mood , MD [address], [address], [place], [place], [address] 3940 [address], [place], [place], [address] Phone: [phone]  Fax: [phone]   Frederick Cabrera is being followed for asites  Day 1 of follow up   Subjective: Feeling much better after paracentesis, no shortness of breath    Objective: Vital signs in last 24 hours: Vitals:   02/15/19 1250 02/15/19 2021 02/16/19 0427 02/16/19 0934  BP: 108/65 139/68 117/81 110/60  Pulse: 71 72 70 77  Resp: 16 18 20    Temp: 98.5 F (36.9 C) 98.1 F (36.7 C) 98.2 F (36.8 C)   TempSrc: Oral Oral Oral   SpO2: 99% 98% 96%   Weight:      Height:       Weight change:   Intake/Output Summary (Last 24 hours) at 02/16/2019 1001 Last data filed at 02/16/2019 9702 Gross per 24 hour  Intake 1301.34 ml  Output 900 ml  Net 401.34 ml     Exam: Heart:: Regular rate and rhythm, S1S2 present or without murmur or extra heart sounds Lungs: normal, clear to auscultation and clear to auscultation and percussion Abdomen: soft, nontender, normal bowel sounds   Lab Results: @LABTEST2 @ Micro Results: Recent Results (from the past 240 hour(s))  Body fluid culture     Status: None   Collection Time: 02/06/19 11:43 AM  Result Value Ref Range Status   Specimen Description   Final    PERITONEAL Performed at [facility], [address], [place], [place] [address]    Special Requests   Final    NONE Performed at [facility], [address] [place], [place] [address]    Gram Stain   Final    FEW WBC PRESENT, PREDOMINANTLY PMN NO ORGANISMS SEEN    Culture   Final    NO GROWTH 3 DAYS Performed at [facility], [address] [place], [place] [address]    Report Status 02/09/2019 FINAL  Final  Fungus Culture With Stain     Status: None (Preliminary result)   Collection Time: 02/06/19 11:43 AM  Result Value Ref Range Status   Fungus Stain Final report  Final    Comment: (NOTE)  Performed At: [facility] [address] [place], [place] [id number] Jolene Schimke MD NO:[id number]    Fungus (Mycology) Culture PENDING  Incomplete   Fungal Source FLUID  Final    Comment: Performed at [facility], [address] [place], [place] [address]  Fungus Culture Result     Status: None   Collection Time: 02/06/19 11:43 AM  Result Value Ref Range Status   Result 1 Comment  Final    Comment: (NOTE) KOH/Calcofluor preparation:  no fungus observed. Performed At: [facility] [address] [place], [place] [id number] Jolene Schimke MD KC:[id number]   SARS Coronavirus 2 [facility] order, Performed in [facility] hospital lab)     Status: None   Collection Time: 02/13/19  6:44 PM  Result Value Ref Range Status   SARS Coronavirus 2 NEGATIVE NEGATIVE Final    Comment: (NOTE) If result is NEGATIVE SARS-CoV-2 target nucleic acids are NOT DETECTED. The SARS-CoV-2 RNA is generally detectable in upper and lower  respiratory specimens during the acute phase of infection. The lowest  concentration of SARS-CoV-2 viral copies this assay can detect is 250  copies / mL. A negative result does not preclude SARS-CoV-2 infection  and should not be used as the sole basis for treatment  or other  patient management decisions.  A negative result may occur with  improper specimen collection / handling, submission of specimen other  than nasopharyngeal swab, presence of viral mutation(s) within the  areas targeted by this assay, and inadequate number of viral copies  (<250 copies / mL). A negative result must be combined with clinical  observations, patient history, and epidemiological information. If result is POSITIVE SARS-CoV-2 target nucleic acids are DETECTED. The SARS-CoV-2 RNA is generally detectable in upper and lower  respiratory specimens dur ing the acute phase of infection.  Positive  results are indicative of active infection with SARS-CoV-2.   Clinical  correlation with patient history and other diagnostic information is  necessary to determine patient infection status.  Positive results do  not rule out bacterial infection or co-infection with other viruses. If result is PRESUMPTIVE POSTIVE SARS-CoV-2 nucleic acids MAY BE PRESENT.   A presumptive positive result was obtained on the submitted specimen  and confirmed on repeat testing.  While 2019 novel coronavirus  (SARS-CoV-2) nucleic acids may be present in the submitted sample  additional confirmatory testing may be necessary for epidemiological  and / or clinical management purposes  to differentiate between  SARS-CoV-2 and other Sarbecovirus currently known to infect humans.  If clinically indicated additional testing with an alternate test  methodology [phone]([id number]) is advised. The SARS-CoV-2 RNA is generally  detectable in upper and lower respiratory sp ecimens during the acute  phase of infection. The expected result is Negative. Fact Sheet for Patients:  BoilerBrush.com.cyhttps://www.fda.gov/media/136312/download Fact Sheet for Healthcare Providers: https://pope.com/https://www.fda.gov/media/136313/download This test is not yet approved or cleared by the [place] FDA and has been authorized for detection and/or diagnosis of SARS-CoV-2 by FDA under an Emergency Use Authorization (EUA).  This EUA will remain in effect (meaning this test can be used) for the duration of the COVID-19 declaration under Section 564(b)(1) of the Act, 21 U.S.C. section 360bbb-3(b)(1), unless the authorization is terminated or revoked sooner. Performed at [facility], [address] [place], [place] [id number]    Studies/Results: Dg Abd 1 View  Result Date: 02/14/2019 CLINICAL DATA:  Abdominal pain and distention. History of cirrhosis. EXAM: ABDOMEN - 1 VIEW COMPARISON:  None. FINDINGS: Bowel gas pattern is nonobstructive. Hazy density throughout the abdomen suggests large ascites. No evidence of free  intraperitoneal air. Visualized osseous structures are unremarkable. IMPRESSION: 1. Suspect large volume ascites. 2. Nonobstructive bowel gas pattern. Electronically Signed   By: Bary RichardStan  Maynard M.D.   On: 02/14/2019 17:07   [place] Paracentesis  Result Date: 02/14/2019 INDICATION: History of alcoholic cirrhosis with recurrent symptomatic intra-abdominal ascites. Please perform ultrasound-guided paracentesis for therapeutic purposes. EXAM: ULTRASOUND GUIDED  PARACENTESIS MEDICATIONS: None. COMPLICATIONS: None immediate. PROCEDURE: Informed written consent was obtained from the patient after a discussion of the risks, benefits and alternatives to treatment. A timeout was performed prior to the initiation of the procedure. Initial ultrasound scanning demonstrates a large amount of ascites within the right lower abdominal quadrant. The right lower abdomen was prepped and draped in the usual sterile fashion. 1% lidocaine with epinephrine was used for local anesthesia. An ultrasound image was saved for documentation purposes. An 8 Fr Safe-T-Centesis catheter was introduced. The paracentesis was performed. The catheter was removed and a dressing was applied. The patient tolerated the procedure well without immediate post procedural complication. FINDINGS: A total of approximately 5 L of serous fluid was removed. IMPRESSION: Successful ultrasound-guided paracentesis yielding 5 liters of peritoneal fluid. Electronically Signed   By:  Elige Ko   On: 02/14/2019 18:39   Medications: I have reviewed the patient's current medications. Scheduled Meds: . folic acid  1 mg Oral Daily  . furosemide  40 mg Intravenous Q12H  . lactulose  20 g Oral TID  . multivitamin with minerals  1 tablet Oral Daily  . nicotine  21 mg Transdermal Daily  . pantoprazole  40 mg Oral BID AC  . propranolol  20 mg Oral BID  . sodium chloride flush  3 mL Intravenous Q12H  . sodium chloride flush  3 mL Intravenous Q12H  . spironolactone  150 mg  Oral Daily  . thiamine  100 mg Oral Daily   Or  . thiamine  100 mg Intravenous Daily   Continuous Infusions: . sodium chloride Stopped (02/15/19 1426)  . albumin human     PRN Meds:.sodium chloride, LORazepam **OR** LORazepam, polyethylene glycol, sodium chloride flush   Assessment: Active Problems:   Ascites due to alcoholic cirrhosis (HCC)   Abdominal pain  Frederick Cabrera 53 y.o. male with decompensated liver cirrhosis with ascites. BNP only mildly elevated. No hepatic encephelopathy.    Plan: 1. Titrate lactulose to two soft bowel movements daily. No role in checking/trending daily ammonia. Hepatic encephalopathy is a clinical diagnosis and serum ammonia levels do not correlate with degree of hepatic encephalopathy.  2. Suggest increasing the aldactone to 200 mg and staying on lasix 40 mg as potassium is borderline low. Main mechanism for ascites is effects of aldosterone and aldactone would help negate that. Recheck BMP in 3-4 days time, outpatient GI follow up within a week   3. Low salt diet   4. Outpatient EGD to screen for varices.   4. Continue Thiamine, good nutrition.   6. Emphasized need to stop all alcohol    LOS: 2 days   Wyline Mood, MD 02/16/2019, 10:01 AM
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
## 2019-02-16 NOTE — Clinical Social Work Note (Addendum)
CSW met with patient and provided resources for substance abuse in [place] and surrounding areas.  Patient stated he just moved here and is not familiar with what resources are available.  Patient was given [employer] list of resources.  Patient was appreciative of resources given, CSW signing off.  Jones Broom. [place], MSW, [place]  02/16/2019 4:06 PM

## 2019-02-16 NOTE — Progress Notes (Signed)
The patient has been discharged. IV removed. Educated on meds and appointments.

## 2019-02-16 NOTE — Progress Notes (Addendum)
The patient refused a bed alarm. He is on a CIWA protocol. Educated about the importance of the alarm to prevent the fall and became agitated when offered the bed alarm. He is addiment that he will leave today. He indicated he has spoken with the MD this morning.    MD notified11:30am--This patient keep indicating he will be going home after his paracentices this eveing. He wants to know if there are resources available for hims to socialize as he indicates he has moved to [place] not to long ago and can feel as he can get drepressed in occassions.

## 2019-02-16 NOTE — Procedures (Signed)
Interventional Radiology Procedure Note  Procedure: US guided paracentesis.   Complications: None Recommendations:  - routine wound care   Signed,  Jordy Verba S. Sanaiya Welliver, DO    

## 2019-02-18 LAB — CYTOLOGY - NON PAP

## 2019-02-19 LAB — BODY FLUID CULTURE: Culture: NO GROWTH

## 2019-02-22 ENCOUNTER — Telehealth: Payer: Self-pay

## 2019-02-22 ENCOUNTER — Encounter: Payer: Self-pay | Admitting: Gastroenterology

## 2019-02-22 ENCOUNTER — Ambulatory Visit: Payer: Self-pay | Admitting: Gastroenterology

## 2019-02-22 NOTE — Telephone Encounter (Signed)
Called pt to pre-chart for today's e-visit with Dr. Anna  Unable to contact. LVM to return call 

## 2019-03-08 ENCOUNTER — Observation Stay
Admission: EM | Admit: 2019-03-08 | Discharge: 2019-03-09 | Discharge status: Against Medical Advice | Payer: Medicaid Other | Attending: Internal Medicine | Admitting: Internal Medicine

## 2019-03-08 ENCOUNTER — Emergency Department: Payer: Medicaid Other

## 2019-03-08 ENCOUNTER — Other Ambulatory Visit: Payer: Self-pay

## 2019-03-08 DIAGNOSIS — Z716 Tobacco abuse counseling: Secondary | ICD-10-CM | POA: Diagnosis not present

## 2019-03-08 DIAGNOSIS — R188 Other ascites: Secondary | ICD-10-CM

## 2019-03-08 DIAGNOSIS — Z1159 Encounter for screening for other viral diseases: Secondary | ICD-10-CM | POA: Insufficient documentation

## 2019-03-08 DIAGNOSIS — F17210 Nicotine dependence, cigarettes, uncomplicated: Secondary | ICD-10-CM | POA: Diagnosis not present

## 2019-03-08 DIAGNOSIS — K7031 Alcoholic cirrhosis of liver with ascites: Secondary | ICD-10-CM | POA: Insufficient documentation

## 2019-03-08 DIAGNOSIS — Y909 Presence of alcohol in blood, level not specified: Secondary | ICD-10-CM | POA: Insufficient documentation

## 2019-03-08 DIAGNOSIS — Z79899 Other long term (current) drug therapy: Secondary | ICD-10-CM | POA: Insufficient documentation

## 2019-03-08 DIAGNOSIS — D6959 Other secondary thrombocytopenia: Secondary | ICD-10-CM | POA: Insufficient documentation

## 2019-03-08 DIAGNOSIS — R0603 Acute respiratory distress: Principal | ICD-10-CM | POA: Diagnosis present

## 2019-03-08 DIAGNOSIS — F1010 Alcohol abuse, uncomplicated: Secondary | ICD-10-CM | POA: Insufficient documentation

## 2019-03-08 DIAGNOSIS — R0902 Hypoxemia: Secondary | ICD-10-CM | POA: Diagnosis present

## 2019-03-08 LAB — COMPREHENSIVE METABOLIC PANEL
ALT: 21 U/L (ref 0–44)
AST: 59 U/L — ABNORMAL HIGH (ref 15–41)
Albumin: 2.4 g/dL — ABNORMAL LOW (ref 3.5–5.0)
Alkaline Phosphatase: 142 U/L — ABNORMAL HIGH (ref 38–126)
Anion gap: 10 (ref 5–15)
BUN: 7 mg/dL (ref 6–20)
CO2: 24 mmol/L (ref 22–32)
Calcium: 8 mg/dL — ABNORMAL LOW (ref 8.9–10.3)
Chloride: 100 mmol/L (ref 98–111)
Creatinine, Ser: 0.54 mg/dL — ABNORMAL LOW (ref 0.61–1.24)
GFR calc Af Amer: >60 mL/min (ref >60)
GFR calc non Af Amer: >60 mL/min (ref >60)
Glucose, Bld: 153 mg/dL — ABNORMAL HIGH (ref 70–99)
Potassium: 3.7 mmol/L (ref 3.5–5.1)
Sodium: 134 mmol/L — ABNORMAL LOW (ref 135–145)
Total Bilirubin: 2.7 mg/dL — ABNORMAL HIGH (ref 0.3–1.2)
Total Protein: 7.2 g/dL (ref 6.5–8.1)

## 2019-03-08 LAB — TSH: TSH: 2.868 u[IU]/mL (ref 0.350–4.500)

## 2019-03-08 LAB — CBC
HCT: 34 % — ABNORMAL LOW (ref 39.0–52.0)
Hemoglobin: 12 g/dL — ABNORMAL LOW (ref 13.0–17.0)
MCH: 33.1 pg (ref 26.0–34.0)
MCHC: 35.3 g/dL (ref 30.0–36.0)
MCV: 93.9 fL (ref 80.0–100.0)
Platelets: 78 10*3/uL — ABNORMAL LOW (ref 150–400)
RBC: 3.62 MIL/uL — ABNORMAL LOW (ref 4.22–5.81)
RDW: 14.8 % (ref 11.5–15.5)
WBC: 10.5 10*3/uL (ref 4.0–10.5)
nRBC: 0 % (ref 0.0–0.2)

## 2019-03-08 LAB — PROTIME-INR
INR: 1.5 — ABNORMAL HIGH (ref 0.8–1.2)
Prothrombin Time: 17.6 seconds — ABNORMAL HIGH (ref 11.4–15.2)

## 2019-03-08 LAB — AMMONIA: Ammonia: 57 umol/L — ABNORMAL HIGH (ref 9–35)

## 2019-03-08 LAB — SARS CORONAVIRUS 2 BY RT PCR (HOSPITAL ORDER, PERFORMED IN ~~LOC~~ HOSPITAL LAB): SARS Coronavirus 2: NEGATIVE

## 2019-03-08 LAB — ETHANOL: Alcohol, Ethyl (B): 252 mg/dL — ABNORMAL HIGH (ref <10)

## 2019-03-08 MED ORDER — ALPRAZOLAM 0.5 MG PO TABS
0.5000 mg | ORAL_TABLET | Freq: Every day | ORAL | Status: DC
  Administered 2019-03-08: 0.5 mg via ORAL
  Filled 2019-03-08: qty 1

## 2019-03-08 MED ORDER — ONDANSETRON HCL 4 MG/2ML IJ SOLN: 4.0000 mg | Freq: Four times a day (QID) | INTRAMUSCULAR | Status: DC | PRN

## 2019-03-08 MED ORDER — ENOXAPARIN SODIUM 40 MG/0.4ML ~~LOC~~ SOLN: 40.0000 mg | SUBCUTANEOUS | Status: DC

## 2019-03-08 MED ORDER — NICOTINE 21 MG/24HR TD PT24
21.0000 mg | MEDICATED_PATCH | Freq: Every day | TRANSDERMAL | Status: DC
  Administered 2019-03-08 – 2019-03-09 (×2): 21 mg via TRANSDERMAL
  Filled 2019-03-08 (×2): qty 1

## 2019-03-08 MED ORDER — TRAMADOL HCL 50 MG PO TABS
50.0000 mg | ORAL_TABLET | Freq: Four times a day (QID) | ORAL | Status: DC | PRN
  Administered 2019-03-08: 21:00:00 50 mg via ORAL
  Filled 2019-03-08: qty 1

## 2019-03-08 MED ORDER — PROPRANOLOL HCL 20 MG PO TABS
20.0000 mg | ORAL_TABLET | Freq: Two times a day (BID) | ORAL | Status: DC
  Administered 2019-03-08 – 2019-03-09 (×3): 20 mg via ORAL
  Filled 2019-03-08 (×5): qty 1

## 2019-03-08 MED ORDER — ONDANSETRON HCL 4 MG PO TABS: 4.0000 mg | ORAL_TABLET | Freq: Four times a day (QID) | ORAL | Status: DC | PRN

## 2019-03-08 MED ORDER — VITAMIN B-1 100 MG PO TABS
100.0000 mg | ORAL_TABLET | Freq: Every day | ORAL | Status: DC
  Administered 2019-03-08 – 2019-03-09 (×2): 100 mg via ORAL
  Filled 2019-03-08 (×3): qty 1

## 2019-03-08 MED ORDER — IPRATROPIUM-ALBUTEROL 0.5-2.5 (3) MG/3ML IN SOLN
3.0000 mL | Freq: Once | RESPIRATORY_TRACT | Status: AC
  Administered 2019-03-08: 06:00:00 3 mL via RESPIRATORY_TRACT
  Filled 2019-03-08: qty 3

## 2019-03-08 MED ORDER — FUROSEMIDE 40 MG PO TABS
40.0000 mg | ORAL_TABLET | Freq: Two times a day (BID) | ORAL | Status: DC
  Administered 2019-03-08: 40 mg via ORAL
  Filled 2019-03-08: qty 1

## 2019-03-08 MED ORDER — SPIRONOLACTONE 25 MG PO TABS
200.0000 mg | ORAL_TABLET | Freq: Every day | ORAL | Status: DC
  Administered 2019-03-08: 14:00:00 200 mg via ORAL
  Filled 2019-03-08 (×2): qty 8

## 2019-03-08 MED ORDER — LACTULOSE 10 GM/15ML PO SOLN
20.0000 g | Freq: Two times a day (BID) | ORAL | Status: DC
  Administered 2019-03-08: 20 g via ORAL
  Filled 2019-03-08 (×2): qty 30

## 2019-03-08 NOTE — ED Notes (Signed)
ED TO INPATIENT HANDOFF REPORT  ED Nurse Name and Phone #: Briane Birden [phone]  S Name/Age/Gender Frederick Calamityichard Mull 53 y.o. male Room/Bed: ED07A/ED07A  Code Status   Code Status: Prior  Home/SNF/Other home Patient oriented x 4 Is this baseline? yes  Triage Complete: Triage complete  Chief Complaint Abdominal Pain   Triage Note Pt to ED via ems from home with c/o abd pain and sob. Pt arrives with distended abd and hx of cirrhosis. Pt admits to alcohol use tonight. Pt has umbilical hernia.   Allergies Allergies  Allergen Reactions  . Wool Alcohol [Lanolin] Rash    Level of Care/Admitting Diagnosis ED Disposition    ED Disposition Condition Comment   Admit  Hospital Area: [facility] [[id number]]  Level of Care: Med-Surg [16]  Covid Evaluation: Person Under Investigation (PUI)  Isolation Risk Level: Low Risk/Droplet (Less than 4L [place] supplementation)  Diagnosis: Respiratory distress [161096][241876]  Admitting Physician: Arnaldo NatalDIAMOND, MICHAEL S [[id number]]  Attending Physician: Arnaldo NatalIAMOND, MICHAEL S [phone][[id number]]  PT Class (Do Not Modify): Observation [104]  PT Acc Code (Do Not Modify): Observation [10022]       B Medical/Surgery History Past Medical History:  Diagnosis Date  . Alcoholic cirrhosis of liver with ascites [facility](HCC)    Past Surgical History:  Procedure Laterality Date  . ESOPHAGOGASTRODUODENOSCOPY (EGD) WITH PROPOFOL N/A 01/17/2019   Procedure: ESOPHAGOGASTRODUODENOSCOPY (EGD) WITH PROPOFOL;  Surgeon: Toledo, Boykin Nearingeodoro K, MD;  Location: [facility] ENDOSCOPY;  Service: Gastroenterology;  Laterality: N/A;     A IV Location/Drains/Wounds Patient Lines/Drains/Airways Status   Active Line/Drains/Airways    Name:   Placement date:   Placement time:   Site:   Days:   Peripheral IV 03/08/19 Right Antecubital   03/08/19    0631    Antecubital   less than 1   Incision (Closed) 02/06/19 Abdomen Lower;Right   02/06/19    1145     30          Intake/Output Last 24 hours No  intake or output data in the 24 hours ending 03/08/19 [id number]  Labs/Imaging Results for orders placed or performed during the hospital encounter of 03/08/19 (from the past 48 hour(s))  CBC     Status: Abnormal   Collection Time: 03/08/19  5:24 AM  Result Value Ref Range   WBC 10.5 4.0 - 10.5 K/uL   RBC 3.62 (L) 4.22 - 5.81 MIL/uL   Hemoglobin 12.0 (L) 13.0 - 17.0 g/dL   HCT 21.334.0 (L) 08.639.0 - 57.852.0 %   MCV 93.9 80.0 - 100.0 fL   MCH 33.1 26.0 - 34.0 pg   MCHC 35.3 30.0 - 36.0 g/dL   RDW 46.914.8 62.911.5 - 52.815.5 %   Platelets 78 (L) 150 - 400 K/uL    Comment: Immature Platelet Fraction may be clinically indicated, consider ordering this additional test [id number]    nRBC 0.0 0.0 - 0.2 %    Comment: Performed at [facility], [address] [place], [place] [address]  Comprehensive metabolic panel     Status: Abnormal   Collection Time: 03/08/19  5:24 AM  Result Value Ref Range   Sodium 134 (L) 135 - 145 mmol/L   Potassium 3.7 3.5 - 5.1 mmol/L   Chloride 100 98 - 111 mmol/L   CO2 24 22 - 32 mmol/L   Glucose, Bld 153 (H) 70 - 99 mg/dL   BUN 7 6 - 20 mg/dL   Creatinine, Ser 3.660.54 (L) 0.61 - 1.24 mg/dL   Calcium 8.0 (  L) 8.9 - 10.3 mg/dL   Total Protein 7.2 6.5 - 8.1 g/dL   Albumin 2.4 (L) 3.5 - 5.0 g/dL   AST 59 (H) 15 - 41 U/L   ALT 21 0 - 44 U/L   Alkaline Phosphatase 142 (H) 38 - 126 U/L   Total Bilirubin 2.7 (H) 0.3 - 1.2 mg/dL   GFR calc non Af Amer >60 >60 mL/min   GFR calc Af Amer >60 >60 mL/min   Anion gap 10 5 - 15    Comment: Performed at [facility], [address] [place], [place] [address]  Protime-INR     Status: Abnormal   Collection Time: 03/08/19  5:24 AM  Result Value Ref Range   Prothrombin Time 17.6 (H) 11.4 - 15.2 seconds   INR 1.5 (H) 0.8 - 1.2    Comment: (NOTE) INR goal varies based on device and disease states. Performed at [facility], [address] [place], [place] [address]   Ammonia     Status: Abnormal   Collection  Time: 03/08/19  5:24 AM  Result Value Ref Range   Ammonia 57 (H) 9 - 35 umol/L    Comment: Performed at [facility], [address] [place], [place] [address]  Ethanol     Status: Abnormal   Collection Time: 03/08/19  5:24 AM  Result Value Ref Range   Alcohol, Ethyl (B) 252 (H) <10 mg/dL    Comment: (NOTE) Lowest detectable limit for serum alcohol is 10 mg/dL. For medical purposes only. Performed at [facility], [address] [place], [place] [address]   SARS Coronavirus 2 (CEPHEID- Performed in [facility] hospital lab), Hosp Order     Status: None   Collection Time: 03/08/19  6:17 AM   Specimen: Nasopharyngeal Swab  Result Value Ref Range   SARS Coronavirus 2 NEGATIVE NEGATIVE    Comment: (NOTE) If result is NEGATIVE SARS-CoV-2 target nucleic acids are NOT DETECTED. The SARS-CoV-2 RNA is generally detectable in upper and lower  respiratory specimens during the acute phase of infection. The lowest  concentration of SARS-CoV-2 viral copies this assay can detect is 250  copies / mL. A negative result does not preclude SARS-CoV-2 infection  and should not be used as the sole basis for treatment or other  patient management decisions.  A negative result may occur with  improper specimen collection / handling, submission of specimen other  than nasopharyngeal swab, presence of viral mutation(s) within the  areas targeted by this assay, and inadequate number of viral copies  (<250 copies / mL). A negative result must be combined with clinical  observations, patient history, and epidemiological information. If result is POSITIVE SARS-CoV-2 target nucleic acids are DETECTED. The SARS-CoV-2 RNA is generally detectable in upper and lower  respiratory specimens dur ing the acute phase of infection.  Positive  results are indicative of active infection with SARS-CoV-2.  Clinical  correlation with patient history and other diagnostic information is  necessary to  determine patient infection status.  Positive results do  not rule out bacterial infection or co-infection with other viruses. If result is PRESUMPTIVE POSTIVE SARS-CoV-2 nucleic acids MAY BE PRESENT.   A presumptive positive result was obtained on the submitted specimen  and confirmed on repeat testing.  While 2019 novel coronavirus  (SARS-CoV-2) nucleic acids may be present in the submitted sample  additional confirmatory testing may be necessary for epidemiological  and / or clinical management purposes  to differentiate between  SARS-CoV-2 and other Sarbecovirus currently known to infect humans.  If clinically indicated additional testing with an alternate test  methodology [phone]([id number]) is advised. The SARS-CoV-2 RNA is generally  detectable in upper and lower respiratory sp ecimens during the acute  phase of infection. The expected result is Negative. Fact Sheet for Patients:  BoilerBrush.com.cyhttps://www.fda.gov/media/136312/download Fact Sheet for Healthcare Providers: https://pope.com/https://www.fda.gov/media/136313/download This test is not yet approved or cleared by the [place] FDA and has been authorized for detection and/or diagnosis of SARS-CoV-2 by FDA under an Emergency Use Authorization (EUA).  This EUA will remain in effect (meaning this test can be used) for the duration of the COVID-19 declaration under Section 564(b)(1) of the Act, 21 U.S.C. section 360bbb-3(b)(1), unless the authorization is terminated or revoked sooner. Performed at [facility], [address] [place], [place] [address]    Dg Chest Portable 1 View  Result Date: 03/08/2019 CLINICAL DATA:  Abdominal pain.  Shortness of breath. EXAM: PORTABLE CHEST 1 VIEW COMPARISON:  02/13/2019. FINDINGS: Stable cardiomegaly. Stable mild bilateral interstitial prominence. A component of interstitial edema/pneumonitis cannot be excluded. Underlying chronic interstitial lung disease may be present. No acute focal alveolar infiltrates.  Low lung volumes. No pleural effusion or pneumothorax. IMPRESSION: 1.  Stable cardiomegaly. 2. Stable mild bilateral interstitial prominence. Component interstitial edema/pneumonitis cannot be excluded. Underlying chronic interstitial lung disease may be present no acute focal alveolar infiltrates noted. Low lung volumes. Electronically Signed   By: Maisie Fushomas  Register   On: 03/08/2019 06:12    Pending Labs Unresulted Labs (From admission, onward)    Start     Ordered   Signed and Held  Creatinine, serum  (enoxaparin (LOVENOX)    CrCl >/= 30 ml/min)  Weekly,   R    Comments: while on enoxaparin therapy    Signed and Held   Signed and Held  TSH  Add-on,   R     Signed and Held          Vitals/Pain Today's Vitals   03/08/19 0615 03/08/19 0630 03/08/19 0715 03/08/19 0800  BP:  117/63  110/63  Pulse: 74 81  81  Resp: 14 (!) 38  19  Temp:      TempSrc:      SpO2: 100% 94%  95%  Weight:      Height:      PainSc:   Asleep     Isolation Precautions Droplet and Contact precautions  Medications Medications  ipratropium-albuterol (DUONEB) 0.5-2.5 (3) MG/3ML nebulizer solution 3 mL (3 mLs Nebulization Given 03/08/19 0617)    Mobility Moderate fall risk   Focused Assessments   R Recommendations: See Admitting Provider Note

## 2019-03-08 NOTE — H&P (Signed)
Frederick Cabrera is an 53 y.o. male.   Chief Complaint: Shortness of breath HPI: The patient with past medical history of alcohol abuse and cirrhosis of the liver presents to the emergency department complaining of shortness of breath.  The patient has had multiple encounters for the same over the last 2 months.  Upon arrival the patient was markedly tachypneic.  Blood alcohol level is also over 250.  The patient had mild hypoxia to 91% on room air.  He reports that his abdominal ascites is significantly larger than it has been in the last week.  He denies chest pain.  Due to large volume ascites and respiratory distress the emergency department staff called the hospitalist service for admission.  Past Medical History:  Diagnosis Date  . Alcoholic cirrhosis of liver with ascites [facility])     Past Surgical History:  Procedure Laterality Date  . ESOPHAGOGASTRODUODENOSCOPY (EGD) WITH PROPOFOL N/A 01/17/2019   Procedure: ESOPHAGOGASTRODUODENOSCOPY (EGD) WITH PROPOFOL;  Surgeon: Toledo, Benay Pike, MD;  Location: [facility] ENDOSCOPY;  Service: Gastroenterology;  Laterality: N/A;    Family History  Problem Relation Age of Onset  . Heart disease Father    Social History:  reports that he has been smoking cigarettes. He has a 10.00 pack-year smoking history. He has never used smokeless tobacco. He reports current alcohol use. He reports previous drug use.  Allergies:  Allergies  Allergen Reactions  . Wool Alcohol [Lanolin] Rash    Prior to Admission medications   Medication Sig Start Date End Date Taking? Authorizing Provider  folic acid (FOLVITE) 1 MG tablet Take 1 tablet (1 mg total) by mouth daily. 02/17/19   Lang Snow, NP  furosemide (LASIX) 40 MG tablet Take 40 mg by mouth 2 (two) times daily. 02/11/19 02/11/20  [provider]  lactulose (CHRONULAC) 10 GM/15ML solution Take 30 mLs (20 g total) by mouth 3 (three) times daily. 02/16/19   Lang Snow, NP  Multiple  Vitamin (MULTIVITAMIN WITH MINERALS) TABS tablet Take 1 tablet by mouth daily. 02/17/19   Lang Snow, NP  nicotine (NICODERM CQ - DOSED IN MG/24 HOURS) 21 mg/24hr patch Place 1 patch (21 mg total) onto the skin daily. 01/23/19   Bettey Costa, MD  pantoprazole (PROTONIX) 40 MG tablet Take 1 tablet (40 mg total) by mouth 2 (two) times daily before a meal. 01/23/19   Gladstone Lighter, MD  polyethylene glycol (MIRALAX / GLYCOLAX) 17 g packet Take 17 g by mouth daily as needed for mild constipation. 02/16/19   Lang Snow, NP  propranolol (INDERAL) 20 MG tablet Take 20 mg by mouth 2 (two) times daily.    [provider]  spironolactone (ALDACTONE) 100 MG tablet Take 2 tablets (200 mg total) by mouth daily. 02/17/19   Lang Snow, NP  thiamine 100 MG tablet Take 1 tablet (100 mg total) by mouth daily. 02/17/19   Lang Snow, NP     Results for orders placed or performed during the hospital encounter of 03/08/19 (from the past 48 hour(s))  CBC     Status: Abnormal   Collection Time: 03/08/19  5:24 AM  Result Value Ref Range   WBC 10.5 4.0 - 10.5 K/uL   RBC 3.62 (L) 4.22 - 5.81 MIL/uL   Hemoglobin 12.0 (L) 13.0 - 17.0 g/dL   HCT 34.0 (L) 39.0 - 52.0 %   MCV 93.9 80.0 - 100.0 fL   MCH 33.1 26.0 - 34.0 pg   MCHC 35.3 30.0 -  36.0 g/dL   RDW 16.114.8 09.611.5 - 04.515.5 %   Platelets 78 (L) 150 - 400 K/uL    Comment: Immature Platelet Fraction may be clinically indicated, consider ordering this additional test [id number]    nRBC 0.0 0.0 - 0.2 %    Comment: Performed at [facility], [address] [place], [place] [address]  Comprehensive metabolic panel     Status: Abnormal   Collection Time: 03/08/19  5:24 AM  Result Value Ref Range   Sodium 134 (L) 135 - 145 mmol/L   Potassium 3.7 3.5 - 5.1 mmol/L   Chloride 100 98 - 111 mmol/L   CO2 24 22 - 32 mmol/L   Glucose, Bld 153 (H) 70 - 99 mg/dL   BUN 7 6 - 20 mg/dL   Creatinine, Ser 5.620.54 (L) 0.61  - 1.24 mg/dL   Calcium 8.0 (L) 8.9 - 10.3 mg/dL   Total Protein 7.2 6.5 - 8.1 g/dL   Albumin 2.4 (L) 3.5 - 5.0 g/dL   AST 59 (H) 15 - 41 U/L   ALT 21 0 - 44 U/L   Alkaline Phosphatase 142 (H) 38 - 126 U/L   Total Bilirubin 2.7 (H) 0.3 - 1.2 mg/dL   GFR calc non Af Amer >60 >60 mL/min   GFR calc Af Amer >60 >60 mL/min   Anion gap 10 5 - 15    Comment: Performed at [facility], [address] [place], [place] [address]  Protime-INR     Status: Abnormal   Collection Time: 03/08/19  5:24 AM  Result Value Ref Range   Prothrombin Time 17.6 (H) 11.4 - 15.2 seconds   INR 1.5 (H) 0.8 - 1.2    Comment: (NOTE) INR goal varies based on device and disease states. Performed at [facility], [address] [place], [place] [address]   Ammonia     Status: Abnormal   Collection Time: 03/08/19  5:24 AM  Result Value Ref Range   Ammonia 57 (H) 9 - 35 umol/L    Comment: Performed at [facility], [address] [place], [place] [address]  Ethanol     Status: Abnormal   Collection Time: 03/08/19  5:24 AM  Result Value Ref Range   Alcohol, Ethyl (B) 252 (H) <10 mg/dL    Comment: (NOTE) Lowest detectable limit for serum alcohol is 10 mg/dL. For medical purposes only. Performed at [facility], [address] [place], [place] [address]    Dg Chest Portable 1 View  Result Date: 03/08/2019 CLINICAL DATA:  Abdominal pain.  Shortness of breath. EXAM: PORTABLE CHEST 1 VIEW COMPARISON:  02/13/2019. FINDINGS: Stable cardiomegaly. Stable mild bilateral interstitial prominence. A component of interstitial edema/pneumonitis cannot be excluded. Underlying chronic interstitial lung disease may be present. No acute focal alveolar infiltrates. Low lung volumes. No pleural effusion or pneumothorax. IMPRESSION: 1.  Stable cardiomegaly. 2. Stable mild bilateral interstitial prominence. Component interstitial edema/pneumonitis cannot be excluded. Underlying chronic  interstitial lung disease may be present no acute focal alveolar infiltrates noted. Low lung volumes. Electronically Signed   By: Maisie Fushomas  Register   On: 03/08/2019 06:12    Review of Systems  Constitutional: Negative for chills and fever.  HENT: Negative for sore throat and tinnitus.   Eyes: Negative for blurred vision and redness.  Respiratory: Positive for shortness of breath. Negative for cough.   Cardiovascular: Negative for chest pain, palpitations, orthopnea and PND.  Gastrointestinal: Negative for abdominal pain, diarrhea, nausea and vomiting.  Genitourinary: Negative for dysuria, frequency and urgency.  Musculoskeletal: Negative for joint pain and myalgias.  Skin: Negative for rash.       No lesions  Neurological: Negative for speech change, focal weakness and weakness.  Endo/Heme/Allergies: Does not bruise/bleed easily.       No temperature intolerance  Psychiatric/Behavioral: Negative for depression and suicidal ideas.    Blood pressure 117/63, pulse 81, temperature 98.3 F (36.8 C), temperature source Oral, resp. rate (!) 38, height 5\' 6"  (1.676 m), weight 90.7 kg, SpO2 94 %. Physical Exam  Vitals reviewed. Constitutional: He is oriented to person, place, and time. He appears well-developed and well-nourished. No distress.  HENT:  Head: Normocephalic and atraumatic.  Mouth/Throat: Oropharynx is clear and moist.  Eyes: Pupils are equal, round, and reactive to light. Conjunctivae and EOM are normal. No scleral icterus.  Neck: Normal range of motion. Neck supple. No JVD present. No tracheal deviation present. No thyromegaly present.  Cardiovascular: Normal rate, regular rhythm and normal heart sounds. Exam reveals no gallop and no friction rub.  No murmur heard. Respiratory: Effort normal and breath sounds normal. No respiratory distress.  GI: Soft. Bowel sounds are normal. He exhibits distension and ascites. There is no abdominal tenderness.  Genitourinary:    Genitourinary  Comments: Deferred   Musculoskeletal: Normal range of motion.        General: No edema.  Lymphadenopathy:    He has no cervical adenopathy.  Neurological: He is alert and oriented to person, place, and time. No cranial nerve deficit.  Skin: Skin is warm and dry. No rash noted. No erythema.  Psychiatric: He has a normal mood and affect. His behavior is normal. Judgment and thought content normal.     Assessment/Plan This is a 53 year old male admitted for respiratory distress. 1.  Respiratory distress: Increased work of breathing due to low lung volumes secondary to ascites.  Continue supplemental oxygen as needed. 2.  Cirrhosis of the liver: Secondary to alcohol abuse; with ascites.  INR is 1.5; the patient is n.p.o. for ultrasound-guided paracentesis.  Continue Lasix and spironolactone.  Continue propranolol for variceal prophylaxis 3.  Alcohol abuse: The patient is currently intoxicated.  He is not agitated.  Continue to monitor. 4.  Tobacco abuse: NicoDerm patch while hospitalized 5.  DVT prophylaxis: SCDs 6.  GI prophylaxis: None The patient is a full code.  Time spent on admission orders and patient care approximately 45 minutes  Arnaldo Nataliamond,  Saya Mccoll S, MD 03/08/2019, 7:02 AM

## 2019-03-08 NOTE — ED Triage Notes (Signed)
Pt to ED via ems from home with c/o abd pain and sob. Pt arrives with distended abd and hx of cirrhosis. Pt admits to alcohol use tonight. Pt has umbilical hernia.

## 2019-03-08 NOTE — Progress Notes (Signed)
Spoke to [employer] with specials. Due to the patient's current state of inebriation, the paracentesis will not be done.   Fuller Mandril, RN

## 2019-03-08 NOTE — Progress Notes (Signed)
Family Meeting Note  Advance Directive:yes  Today a meeting took place with the Patient.     The following clinical team members were present during this meeting:MD  The following were discussed:Patient's diagnosis: Acute respiratory distress, alcoholic liver cirrhosis, thrombocytopenia, tobacco abuse disorder , treatment plan of care discussed in detail with the patient.  He verbalized understanding of the plan.   patient's progosis: Unable to determine and Goals for treatment: Full Code, sister-in-law Reva is the healthcare power of attorney  Additional follow-up to be provided: Hospitalist  Time spent during discussion:17 min  Nicholes Mango, MD

## 2019-03-08 NOTE — Progress Notes (Signed)
Per Dr. Margaretmary Eddy add 0.5mg  Xanax hs.   Fuller Mandril, RN

## 2019-03-08 NOTE — Progress Notes (Signed)
[place] [facility] [place] NAME: Frederick Cabrera    MR#:  [id number]  DATE OF BIRTH:  [birth year]  SUBJECTIVE:  CHIEF COMPLAINT: Patient is resting comfortably, asking for pain medicine.  Agreeable with nicotine patch and really wants to quit alcohol  REVIEW OF SYSTEMS:  CONSTITUTIONAL: No fever, fatigue or weakness.  EYES: No blurred or double vision.  EARS, NOSE, AND THROAT: No tinnitus or ear pain.  RESPIRATORY: No cough, endorses shortness of breath with minimal exertion, denies wheezing or hemoptysis.  CARDIOVASCULAR: No chest pain, orthopnea, edema.  GASTROINTESTINAL: No nausea, vomiting, diarrhea or abdominal pain.  GENITOURINARY: No dysuria, hematuria.  ENDOCRINE: No polyuria, nocturia,  HEMATOLOGY: No anemia, easy bruising or bleeding SKIN: No rash or lesion. MUSCULOSKELETAL: No joint pain or arthritis.   NEUROLOGIC: No tingling, numbness, weakness.  PSYCHIATRY: No anxiety or depression.   DRUG ALLERGIES:   Allergies  Allergen Reactions  . Wool Alcohol [Lanolin] Rash    VITALS:  Blood pressure (!) 145/75, pulse 82, temperature 98.2 F (36.8 C), temperature source Oral, resp. rate 18, height 5\' 6"  (1.676 m), weight 90.7 kg, SpO2 98 %.  PHYSICAL EXAMINATION:  GENERAL:  53-year-old patient lying in the bed with no acute distress.  EYES: Pupils equal, round, reactive to light and accommodation. No scleral icterus. Extraocular muscles intact.  HEENT: Head atraumatic, normocephalic. Oropharynx and nasopharynx clear.  NECK:  Supple, no jugular venous distention. No thyroid enlargement, no tenderness.  LUNGS: Normal breath sounds bilaterally, no wheezing, rales,rhonchi or crepitation. No use of accessory muscles of respiration.  CARDIOVASCULAR: S1, S2 normal. No murmurs, rubs, or gallops.  ABDOMEN: Soft, diffusely tender, no rebound tenderness, positive distended. Bowel sounds present.  Fluid thrill is present positive  ascites EXTREMITIES: No pedal edema, cyanosis, or clubbing.  NEUROLOGIC: Cranial nerves II through XII are intact. Muscle strength locally weak in all extremities. Sensation intact. Gait not checked.  PSYCHIATRIC: The patient is alert and oriented x 3.  SKIN: No obvious rash, lesion, or ulcer.    LABORATORY PANEL:   CBC Recent Labs  Lab 03/08/19 0524  WBC 10.5  HGB 12.0*  HCT 34.0*  PLT 78*   ------------------------------------------------------------------------------------------------------------------  Chemistries  Recent Labs  Lab 03/08/19 0524  NA 134*  K 3.7  CL 100  CO2 24  GLUCOSE 153*  BUN 7  CREATININE 0.54*  CALCIUM 8.0*  AST 59*  ALT 21  ALKPHOS 142*  BILITOT 2.7*   ------------------------------------------------------------------------------------------------------------------  Cardiac Enzymes No results for input(s): TROPONINI in the last 168 hours. ------------------------------------------------------------------------------------------------------------------  RADIOLOGY:  Dg Chest Portable 1 View  Result Date: 03/08/2019 CLINICAL DATA:  Abdominal pain.  Shortness of breath. EXAM: PORTABLE CHEST 1 VIEW COMPARISON:  02/13/2019. FINDINGS: Stable cardiomegaly. Stable mild bilateral interstitial prominence. A component of interstitial edema/pneumonitis cannot be excluded. Underlying chronic interstitial lung disease may be present. No acute focal alveolar infiltrates. Low lung volumes. No pleural effusion or pneumothorax. IMPRESSION: 1.  Stable cardiomegaly. 2. Stable mild bilateral interstitial prominence. Component interstitial edema/pneumonitis cannot be excluded. Underlying chronic interstitial lung disease may be present no acute focal alveolar infiltrates noted. Low lung volumes. Electronically Signed   By: Marcello Moores  Register   On: 03/08/2019 06:12    EKG:   Orders placed or performed during the hospital encounter of 03/08/19  . EKG 12-Lead  .  EKG 12-Lead    ASSESSMENT AND PLAN:    This is a 53-year-old male admitted for respiratory distress. 1.  Acute Respiratory distress: Increased work of breathing due to atelectasis of the lower lung fields from ascites .  Continue supplemental oxygen as needed. 2.    Alcoholic liver cirrhosis : Presenting with ascites.  INR is 1.5; the patient is n.p.o after midnight. for ultrasound-guided paracentesis in a.m.  Continue Lasix and spironolactone.  Continue propranolol for variceal prophylaxis 3.  Alcohol abuse: CIWA  He is not agitated.    Outpatient alcohol Anonymous continue to monitor. 4.  Tobacco abuse disorder: Counseled patient to quit smoking for 5 minutes.  He verbalized understanding of the plan.  Agreeable with nicotine patch  5.    Thrombocytopenia from alcohol abuse -no active bleeding, avoid antiplatelet agents   DVT prophylaxis: SCDs.  GI prophylaxis: None    All the records are reviewed and case discussed with Care Management/Social Workerr. Management plans discussed with the patient, HE IS  in agreement.  CODE STATUS: fc   TOTAL TIME TAKING CARE OF THIS PATIENT: 37  minutes.   POSSIBLE D/C IN 2  DAYS, DEPENDING ON CLINICAL CONDITION.  Note: This dictation was prepared with Dragon dictation along with smaller phrase technology. Any transcriptional errors that result from this process are unintentional.   Ramonita LabAruna Lekisha Mcghee M.D on 03/08/2019 at 1:39 PM  Between 7am to 6pm - Pager - [phone] After 6pm go to [email or web address] - password [email or web address]  [facility]  Office  [phone]  CC: Primary care physician; Patient, No Pcp Per

## 2019-03-09 ENCOUNTER — Observation Stay: Payer: Medicaid Other

## 2019-03-09 LAB — FUNGUS CULTURE WITH STAIN

## 2019-03-09 LAB — FUNGAL ORGANISM REFLEX

## 2019-03-09 LAB — FUNGUS CULTURE RESULT

## 2019-03-09 NOTE — Discharge Summary (Signed)
Date of admission- 03/08/19  Date left AMA - 03/09/19   HPI: The patient with past medical history of alcohol abuse and cirrhosis of the liver presents to the emergency department complaining of shortness of breath.  The patient has had multiple encounters for the same over the last 2 months.  Upon arrival the patient was markedly tachypneic.  Blood alcohol level is also over 250.  The patient had mild hypoxia to 91% on room air.  He reports that his abdominal ascites is significantly larger than it has been in the last week.  He denies chest pain.  Due to large volume ascites and respiratory distress the emergency department staff called the hospitalist service for admission.   Hospital course  Review of system CONSTITUTIONAL: No fever, fatigue or weakness.  EYES: No blurred or double vision.  EARS, NOSE, AND THROAT: No tinnitus or ear pain.  RESPIRATORY: No cough, endorses shortness of breath with minimal exertion, denies wheezing or hemoptysis.  CARDIOVASCULAR: No chest pain, orthopnea, edema.  GASTROINTESTINAL: No nausea, vomiting, diarrhea or abdominal pain.  GENITOURINARY: No dysuria, hematuria.  ENDOCRINE: No polyuria, nocturia,  HEMATOLOGY: No anemia, easy bruising or bleeding SKIN: No rash or lesion. MUSCULOSKELETAL: No joint pain or arthritis.   NEUROLOGIC: No tingling, numbness, weakness.  PSYCHIATRY: No anxiety or depression.  Physical  Physical examination  GENERAL:  53 y.o.-year-old patient lying in the bed with no acute distress.  EYES: Pupils equal, round, reactive to light and accommodation. No scleral icterus. Extraocular muscles intact.  HEENT: Head atraumatic, normocephalic. Oropharynx and nasopharynx clear.  NECK:  Supple, no jugular venous distention. No thyroid enlargement, no tenderness.  LUNGS: Normal breath sounds bilaterally, no wheezing, rales,rhonchi or crepitation. No use of accessory muscles of respiration.  CARDIOVASCULAR: S1, S2 normal. No murmurs, rubs,  or gallops.  ABDOMEN: Soft, diffusely tender, no rebound tenderness, positive distended. Bowel sounds present.  Fluid thrill is present positive ascites EXTREMITIES: No pedal edema, cyanosis, or clubbing.  NEUROLOGIC: Cranial nerves II through XII are intact. Muscle strength locally weak in all extremities. Sensation intact. Gait not checked.  PSYCHIATRIC: The patient is alert and oriented x 3.  SKIN: No obvious rash, lesion, or ulcer.     1.  AcuteRespiratory distress: Increased work of breathing dueto atelectasis of the lower lung fields from ascites  at the time of admission.  Patient had paracentesis today following that he left hospital [place].  Refused to talk to me if he has something really not been going on with his family  2.   Alcoholic liver cirrhosis : Presented with ascites. INR is 1.5; patient had paracentesis today and left AMA. Continue propranolol for variceal prophylaxis  3. Alcohol abuse: CIWA He is not agitated.   Outpatient alcohol Anonymous continue to monitor.  4. Tobacco abuse disorder: Counseled patient to quit smoking for 5 minutes.  He verbalized understanding of the plan.  Agreeable with nicotine patch   5.   Thrombocytopenia from alcohol abuse -no active bleeding, avoid antiplatelet agents   Patient left hospital AGAINST MEDICAL ADVICE  Total time spent -37 minutes

## 2019-03-09 NOTE — Progress Notes (Signed)
Back from paracentesis. Bandage on right abd. Is dry and intact. States " I am going home" Something is going on with my brother

## 2019-03-09 NOTE — Progress Notes (Signed)
Dr. Margaretmary Eddy aware of patient leaving AMA. Patient refuses to wait on MD or talk to her on the phone. IV dc'd. Instructed patient about any bleeding from paracentesis site to come back to ED and he agreed. I tried to talk him into staying but he refuses. Signed AMA form

## 2019-03-09 NOTE — Progress Notes (Signed)
In [place] for paracentesis

## 2019-03-24 ENCOUNTER — Telehealth: Payer: Self-pay | Admitting: Pharmacy Technician

## 2019-03-24 NOTE — Telephone Encounter (Signed)
Patient failed to provide requested financial documentation.  Financial documentation is required in order to determine patient's eligibility for [facility]'s program.  No additional medication assistance will be provided until patient provides requested financial documentation.  Patient notified by letter.  Ravleen Ries Pharmacy Technician/Eligibility Specialist [facility] 

## 2019-04-04 NOTE — ED Provider Notes (Addendum)
[facility] Emergency Department Provider Note    First MD Initiated Contact with Patient 03/08/19 [phone]     (approximate)  I have reviewed the triage vital signs and the nursing notes.   HISTORY  Chief Complaint Abdominal Pain   HPI Frederick Cabrera is a 53 y.o. male with below list of previous medical conditions including alcohol induced cirrhosis of the liver with ascites presents to the emergency department secondary to worsening abdominal distention with progressive dyspnea over the past 2 days.  Of note patient has had multiple encounters in the emergency department for the same in the past 2 months.  Upon arrival to the emergency department patient markedly tachypneic.  Patient denies any chest pain.  Patient does admit to bilateral lower extremity swelling.  Patient current oxygen saturation 89%        Past Medical History:  Diagnosis Date  . Alcoholic cirrhosis of liver with ascites [facility](HCC)     Patient Active Problem List   Diagnosis Date Noted  . Hypoxia 03/08/2019  . Respiratory distress 03/08/2019  . Abdominal pain 02/14/2019  . Ascites due to alcoholic cirrhosis (HCC) 02/13/2019  . Alcoholic cirrhosis of liver with ascites (HCC)   . Goals of care, counseling/discussion   . Palliative care by specialist   . Hematemesis 01/17/2019    Past Surgical History:  Procedure Laterality Date  . ESOPHAGOGASTRODUODENOSCOPY (EGD) WITH PROPOFOL N/A 01/17/2019   Procedure: ESOPHAGOGASTRODUODENOSCOPY (EGD) WITH PROPOFOL;  Surgeon: Toledo, Boykin Nearingeodoro K, MD;  Location: [facility] ENDOSCOPY;  Service: Gastroenterology;  Laterality: N/A;    Prior to Admission medications   Medication Sig Start Date End Date Taking? Authorizing Provider  folic acid (FOLVITE) 1 MG tablet Take 1 tablet (1 mg total) by mouth daily. 02/17/19  Yes Jimmye Normanuma, Elizabeth Achieng, NP  furosemide (LASIX) 40 MG tablet Take 40 mg by mouth daily.  02/11/19 02/11/20 Yes [provider]   Multiple Vitamin (MULTIVITAMIN WITH MINERALS) TABS tablet Take 1 tablet by mouth daily. 02/17/19  Yes Jimmye Normanuma, Elizabeth Achieng, NP  pantoprazole (PROTONIX) 40 MG tablet Take 1 tablet (40 mg total) by mouth 2 (two) times daily before a meal. 01/23/19  Yes Enid BaasKalisetti, Radhika, MD  propranolol (INDERAL) 20 MG tablet Take 20 mg by mouth 2 (two) times daily.   Yes [provider]  spironolactone (ALDACTONE) 100 MG tablet Take 2 tablets (200 mg total) by mouth daily. Patient taking differently: Take 100 mg by mouth daily.  02/17/19  Yes Jimmye Normanuma, Elizabeth Achieng, NP  thiamine 100 MG tablet Take 1 tablet (100 mg total) by mouth daily. 02/17/19  Yes Jimmye Normanuma, Elizabeth Achieng, NP  lactulose (CHRONULAC) 10 GM/15ML solution Take 30 mLs (20 g total) by mouth 3 (three) times daily. 02/16/19   Jimmye Normanuma, Elizabeth Achieng, NP  nicotine (NICODERM CQ - DOSED IN MG/24 HOURS) 21 mg/24hr patch Place 1 patch (21 mg total) onto the skin daily. 01/23/19   Adrian SaranMody, Sital, MD  polyethylene glycol (MIRALAX / GLYCOLAX) 17 g packet Take 17 g by mouth daily as needed for mild constipation. 02/16/19   Jimmye Normanuma, Elizabeth Achieng, NP    Allergies Wool alcohol [lanolin]  Family History  Problem Relation Age of Onset  . Heart disease Father     Social History Social History   Tobacco Use  . Smoking status: Current Every Day Smoker    Packs/day: 0.50    Years: 20.00    Pack years: 10.00    Types: Cigarettes  . Smokeless tobacco: Never Used  Substance  Use Topics  . Alcohol use: Yes    Comment: last drink 02/12/19  . Drug use: Not Currently    Review of Systems Constitutional: No fever/chills Eyes: No visual changes. ENT: No sore throat. Cardiovascular: Denies chest pain. Respiratory: Positive for shortness of breath. Gastrointestinal: No abdominal pain.  No nausea, no vomiting.  No diarrhea.  No constipation.  Positive for abdominal distention Genitourinary: Negative for dysuria. Musculoskeletal: Negative for neck pain.   Negative for back pain. Integumentary: Negative for rash. Neurological: Negative for headaches, focal weakness or numbness.   ____________________________________________   PHYSICAL EXAM:  VITAL SIGNS: ED Triage Vitals  Enc Vitals Group     BP 03/08/19 0517 (!) 143/91     Pulse Rate 03/08/19 0517 94     Resp 03/08/19 0517 16     Temp 03/08/19 0517 98.3 F (36.8 C)     Temp Source 03/08/19 0517 Oral     SpO2 03/08/19 0517 100 %     Weight 03/08/19 0515 90.7 kg (200 lb)     Height 03/08/19 0515 1.676 m (5\' 6" )     Head Circumference --      Peak Flow --      Pain Score 03/08/19 0514 8     Pain Loc --      Pain Edu? --      Excl. in [place]? --     Constitutional: Alert and oriented.  Apparent respiratory distress Eyes: Conjunctivae are normal.  Mouth/Throat: Mucous membranes are moist.  Oropharynx non-erythematous. Neck: No stridor.   Cardiovascular: Normal rate, regular rhythm. Good peripheral circulation. Grossly normal heart sounds. Respiratory: Tachypnea, positive accessory respiratory muscle use, bibasilar rhonchi Gastrointestinal: Soft and nontender.  Markedly distended Musculoskeletal: No lower extremity tenderness nor edema. No gross deformities of extremities. Neurologic:  Normal speech and language. No gross focal neurologic deficits are appreciated.  Skin:  Skin is warm, dry and intact. No rash noted. Psychiatric: Mood and affect are normal. Speech and behavior are normal.  ____________________________________________   LABS (all labs ordered are listed, but only abnormal results are displayed)  Labs Reviewed  CBC - Abnormal; Notable for the following components:      Result Value   RBC 3.62 (*)    Hemoglobin 12.0 (*)    HCT 34.0 (*)    Platelets 78 (*)    All other components within normal limits  COMPREHENSIVE METABOLIC PANEL - Abnormal; Notable for the following components:   Sodium 134 (*)    Glucose, Bld 153 (*)    Creatinine, Ser 0.54 (*)     Calcium 8.0 (*)    Albumin 2.4 (*)    AST 59 (*)    Alkaline Phosphatase 142 (*)    Total Bilirubin 2.7 (*)    All other components within normal limits  PROTIME-INR - Abnormal; Notable for the following components:   Prothrombin Time 17.6 (*)    INR 1.5 (*)    All other components within normal limits  AMMONIA - Abnormal; Notable for the following components:   Ammonia 57 (*)    All other components within normal limits  ETHANOL - Abnormal; Notable for the following components:   Alcohol, Ethyl (B) 252 (*)    All other components within normal limits  SARS CORONAVIRUS 2 (HOSPITAL ORDER, PERFORMED IN [place] [facility])  TSH   ____________________________________________  EKG ED ECG REPORT I, [place] N BROWN, the attending physician, personally viewed and interpreted this ECG.   Date: 03/08/2019  EKG Time: 5:16 AM  Rate: 87  Rhythm: Normal sinus rhythm  Axis: Normal  Intervals: Normal  ST&T Change: None  ____________________________________________  RADIOLOGY I, [place] N BROWN, personally viewed and evaluated these images (plain radiographs) as part of my medical decision making, as well as reviewing the written report by the radiologist.  ED MD interpretation: Stable cardiomegaly on chest x-ray.  Official radiology report(s): No results found.   Procedures   ____________________________________________   INITIAL IMPRESSION / MDM / ASSESSMENT AND PLAN / ED COURSE  As part of my medical decision making, I reviewed the following data within the electronic MEDICAL RECORD NUMBER 53 year old male presented above-stated history and physical exam secondary to market ascites with dyspnea.  Suspect the patient dyspnea to be secondary to decreased lung volumes to due to his ascites.  Discussed the patient with Dr. Marcille Blanco for hospital admission further evaluation and management including paracentesis.   ____________________________________________  FINAL CLINICAL  IMPRESSION(S) / ED DIAGNOSES  Final diagnoses:  Ascites     MEDICATIONS GIVEN DURING THIS VISIT:  Medications  ipratropium-albuterol (DUONEB) 0.5-2.5 (3) MG/3ML nebulizer solution 3 mL (3 mLs Nebulization Given 03/08/19 0617)     ED Discharge Orders    None      *Please note:  Frederick Cabrera was evaluated in Emergency Department on 04/04/2019 for the symptoms described in the history of present illness. He was evaluated in the context of the global COVID-19 pandemic, which necessitated consideration that the patient might be at risk for infection with the SARS-CoV-2 virus that causes COVID-19. Institutional protocols and algorithms that pertain to the evaluation of patients at risk for COVID-19 are in a state of rapid change based on information released by regulatory bodies including the CDC and federal and state organizations. These policies and algorithms were followed during the patient's care in the ED.  Some ED evaluations and interventions may be delayed as a result of limited staffing during the pandemic.*  Note:  This document was prepared using Dragon voice recognition software and may include unintentional dictation errors.   Gregor Hams, MD 04/04/19 0034    Gregor Hams, MD 04/04/19 [phone]

## 2019-07-01 ENCOUNTER — Encounter: Payer: Self-pay | Admitting: Emergency Medicine

## 2019-07-01 ENCOUNTER — Emergency Department
Admission: EM | Admit: 2019-07-01 | Discharge: 2019-07-01 | Discharge status: Against Medical Advice | Payer: Medicaid Other | Attending: Emergency Medicine | Admitting: Emergency Medicine

## 2019-07-01 ENCOUNTER — Other Ambulatory Visit: Payer: Self-pay

## 2019-07-01 ENCOUNTER — Emergency Department: Payer: Medicaid Other

## 2019-07-01 ENCOUNTER — Other Ambulatory Visit (HOSPITAL_COMMUNITY): Payer: Self-pay | Admitting: Diagnostic Radiology

## 2019-07-01 DIAGNOSIS — K7460 Unspecified cirrhosis of liver: Secondary | ICD-10-CM | POA: Insufficient documentation

## 2019-07-01 DIAGNOSIS — Z20828 Contact with and (suspected) exposure to other viral communicable diseases: Secondary | ICD-10-CM | POA: Diagnosis not present

## 2019-07-01 DIAGNOSIS — F17210 Nicotine dependence, cigarettes, uncomplicated: Secondary | ICD-10-CM | POA: Diagnosis not present

## 2019-07-01 DIAGNOSIS — Z79899 Other long term (current) drug therapy: Secondary | ICD-10-CM | POA: Diagnosis not present

## 2019-07-01 DIAGNOSIS — R140 Abdominal distension (gaseous): Secondary | ICD-10-CM

## 2019-07-01 DIAGNOSIS — E871 Hypo-osmolality and hyponatremia: Secondary | ICD-10-CM

## 2019-07-01 LAB — CBC WITH DIFFERENTIAL/PLATELET
Abs Immature Granulocytes: 0.06 10*3/uL (ref 0.00–0.07)
Basophils Absolute: 0.1 10*3/uL (ref 0.0–0.1)
Basophils Relative: 1 %
Eosinophils Absolute: 0.1 10*3/uL (ref 0.0–0.5)
Eosinophils Relative: 1 %
HCT: 32.5 % — ABNORMAL LOW (ref 39.0–52.0)
Hemoglobin: 12.2 g/dL — ABNORMAL LOW (ref 13.0–17.0)
Immature Granulocytes: 1 %
Lymphocytes Relative: 15 %
Lymphs Abs: 1 10*3/uL (ref 0.7–4.0)
MCH: 36.9 pg — ABNORMAL HIGH (ref 26.0–34.0)
MCHC: 37.5 g/dL — ABNORMAL HIGH (ref 30.0–36.0)
MCV: 98.2 fL (ref 80.0–100.0)
Monocytes Absolute: 0.8 10*3/uL (ref 0.1–1.0)
Monocytes Relative: 13 %
Neutro Abs: 4.4 10*3/uL (ref 1.7–7.7)
Neutrophils Relative %: 69 %
Platelets: 49 10*3/uL — ABNORMAL LOW (ref 150–400)
RBC: 3.31 MIL/uL — ABNORMAL LOW (ref 4.22–5.81)
RDW: 16.5 % — ABNORMAL HIGH (ref 11.5–15.5)
WBC: 6.4 10*3/uL (ref 4.0–10.5)
nRBC: 0 % (ref 0.0–0.2)

## 2019-07-01 LAB — BODY FLUID CELL COUNT WITH DIFFERENTIAL
Eos, Fluid: 0 %
Lymphs, Fluid: 29 %
Monocyte-Macrophage-Serous Fluid: 59 %
Neutrophil Count, Fluid: 12 %
Total Nucleated Cell Count, Fluid: 347 cu mm

## 2019-07-01 LAB — LIPASE, BLOOD: Lipase: 70 U/L — ABNORMAL HIGH (ref 11–51)

## 2019-07-01 LAB — SARS CORONAVIRUS 2 (TAT 6-24 HRS): SARS Coronavirus 2: NEGATIVE

## 2019-07-01 MED ORDER — LORAZEPAM 1 MG PO TABS
1.0000 mg | ORAL_TABLET | Freq: Once | ORAL | Status: AC
  Administered 2019-07-01: 12:00:00 1 mg via ORAL
  Filled 2019-07-01: qty 1

## 2019-07-01 MED ORDER — LORAZEPAM 1 MG PO TABS: 1.0000 mg | ORAL_TABLET | Freq: Two times a day (BID) | ORAL | 0 refills | Status: DC

## 2019-07-01 NOTE — ED Provider Notes (Signed)
[facility] Emergency Department Provider Note   ____________________________________________   First MD Initiated Contact with Patient 07/01/19 1126     (approximate)  I have reviewed the triage vital signs and the nursing notes.   HISTORY  Chief Complaint Bloated and Cirrhosis    HPI Frederick Cabrera is a 53 y.o. male who reports increasing bloating in his belly.  He has ascites.  He has cirrhosis.  He is also a little jaundiced.  He reports just very mild abdominal tenderness.         Past Medical History:  Diagnosis Date  . Alcoholic cirrhosis of liver with ascites [facility](HCC)     Patient Active Problem List   Diagnosis Date Noted  . Hypoxia 03/08/2019  . Respiratory distress 03/08/2019  . Abdominal pain 02/14/2019  . Ascites due to alcoholic cirrhosis (HCC) 02/13/2019  . Alcoholic cirrhosis of liver with ascites (HCC)   . Goals of care, counseling/discussion   . Palliative care by specialist   . Hematemesis 01/17/2019    Past Surgical History:  Procedure Laterality Date  . ESOPHAGOGASTRODUODENOSCOPY (EGD) WITH PROPOFOL N/A 01/17/2019   Procedure: ESOPHAGOGASTRODUODENOSCOPY (EGD) WITH PROPOFOL;  Surgeon: Toledo, Boykin Nearingeodoro K, MD;  Location: [facility] ENDOSCOPY;  Service: Gastroenterology;  Laterality: N/A;    Prior to Admission medications   Medication Sig Start Date End Date Taking? Authorizing Provider  folic acid (FOLVITE) 1 MG tablet Take 1 tablet (1 mg total) by mouth daily. 02/17/19   Jimmye Normanuma, Elizabeth Achieng, NP  furosemide (LASIX) 40 MG tablet Take 40 mg by mouth daily.  02/11/19 02/11/20  [provider]  lactulose (CHRONULAC) 10 GM/15ML solution Take 30 mLs (20 g total) by mouth 3 (three) times daily. 02/16/19   Jimmye Normanuma, Elizabeth Achieng, NP  LORazepam (ATIVAN) 1 MG tablet Take 1 tablet (1 mg total) by mouth 2 (two) times daily. Take 1 mg of Ativan 2 pills 3 times a day for 2 days then decrease to 1 mg of Ativan 2 pills twice a day for 2  days then decrease to 1 mg of Ativan 1 pill twice a day for 2 days then stop 07/01/19 06/30/20  Arnaldo NatalMalinda, Paul F, MD  Multiple Vitamin (MULTIVITAMIN WITH MINERALS) TABS tablet Take 1 tablet by mouth daily. 02/17/19   Jimmye Normanuma, Elizabeth Achieng, NP  nicotine (NICODERM CQ - DOSED IN MG/24 HOURS) 21 mg/24hr patch Place 1 patch (21 mg total) onto the skin daily. 01/23/19   Adrian SaranMody, Sital, MD  pantoprazole (PROTONIX) 40 MG tablet Take 1 tablet (40 mg total) by mouth 2 (two) times daily before a meal. 01/23/19   Enid BaasKalisetti, Radhika, MD  polyethylene glycol (MIRALAX / GLYCOLAX) 17 g packet Take 17 g by mouth daily as needed for mild constipation. 02/16/19   Jimmye Normanuma, Elizabeth Achieng, NP  propranolol (INDERAL) 20 MG tablet Take 20 mg by mouth 2 (two) times daily.    [provider]  spironolactone (ALDACTONE) 100 MG tablet Take 2 tablets (200 mg total) by mouth daily. Patient taking differently: Take 100 mg by mouth daily.  02/17/19   Jimmye Normanuma, Elizabeth Achieng, NP  thiamine 100 MG tablet Take 1 tablet (100 mg total) by mouth daily. 02/17/19   Jimmye Normanuma, Elizabeth Achieng, NP    Allergies Wool alcohol [lanolin]  Family History  Problem Relation Age of Onset  . Heart disease Father     Social History Social History   Tobacco Use  . Smoking status: Current Every Day Smoker    Packs/day: 0.50  Years: 20.00    Pack years: 10.00    Types: Cigarettes  . Smokeless tobacco: Never Used  Substance Use Topics  . Alcohol use: Yes    Comment: last drink 02/12/19  . Drug use: Not Currently    Review of Systems  Constitutional: No fever/chills Eyes: No visual changes. ENT: No sore throat. Cardiovascular: Denies chest pain. Respiratory: Denies shortness of breath. Gastrointestinal: Minimal abdominal pain.  No nausea, no vomiting.  No diarrhea.  No constipation. Genitourinary: Negative for dysuria. Musculoskeletal: Negative for back pain. Skin: Negative for rash. Neurological: Negative for headaches, focal  weakness   ____________________________________________   PHYSICAL EXAM:  VITAL SIGNS: ED Triage Vitals  Enc Vitals Group     BP 07/01/19 1053 (!) 157/95     Pulse Rate 07/01/19 1053 83     Resp 07/01/19 1053 16     Temp 07/01/19 1053 (!) 97.4 F (36.3 C)     Temp Source 07/01/19 1053 Oral     SpO2 07/01/19 1053 96 %     Weight --      Height 07/01/19 1047 5\' 4"  (1.626 m)     Head Circumference --      Peak Flow --      Pain Score 07/01/19 1047 7     Pain Loc --      Pain Edu? --      Excl. in [place]? --     Constitutional: Alert and oriented. Well appearing and in no acute distress. Eyes: Conjunctivae are jaundiced. Head: Atraumatic. Nose: No congestion/rhinnorhea. Mouth/Throat: Mucous membranes are moist.  Oropharynx non-erythematous. Neck: No stridor.  Cardiovascular: Normal rate, regular rhythm. Grossly normal heart sounds.  Good peripheral circulation. Respiratory: Normal respiratory effort.  No retractions. Lungs CTAB. Gastrointestinal: Soft and minimally tender.  distention. No abdominal bruits. No CVA tenderness. Musculoskeletal: No lower extremity tenderness nor edema.  No joint effusions. Neurologic:  Normal speech and language. No gross focal neurologic deficits are appreciated. No gait instability. Skin:  Skin is warm, dry and intact. No rash noted.   ____________________________________________   LABS (all labs ordered are listed, but only abnormal results are displayed)  Labs Reviewed  COMPREHENSIVE METABOLIC PANEL - Abnormal; Notable for the following components:      Result Value   Sodium 123 (*)    Chloride 90 (*)    CO2 21 (*)    Glucose, Bld 130 (*)    BUN 5 (*)    Calcium 8.1 (*)    Total Protein 8.2 (*)    Albumin 2.3 (*)    AST 119 (*)    Alkaline Phosphatase 160 (*)    Total Bilirubin 13.0 (*)    All other components within normal limits  LIPASE, BLOOD - Abnormal; Notable for the following components:   Lipase 70 (*)    All other  components within normal limits  CBC WITH DIFFERENTIAL/PLATELET - Abnormal; Notable for the following components:   RBC 3.31 (*)    Hemoglobin 12.2 (*)    HCT 32.5 (*)    MCH 36.9 (*)    MCHC 37.5 (*)    RDW 16.5 (*)    Platelets 49 (*)    All other components within normal limits  BODY FLUID CELL COUNT WITH DIFFERENTIAL - Abnormal; Notable for the following components:   Color, Fluid YELLOW (*)    Appearance, Fluid HAZY (*)    All other components within normal limits  BODY FLUID CULTURE  SARS CORONAVIRUS 2 (TAT 6-24  HRS)  PROTIME-INR  APTT   ____________________________________________  EKG   ____________________________________________  RADIOLOGY  ED MD interpretation:    Official radiology report(s): US Paracentesis  Result Date: 07/01/2019 INDICATION: Recurrent ascites EXAM: ULTRASOUND GUIDED  PARACENTESIS MEDICATIONS: None. COMPLICATIONS: None immediate. PROCEDURE: Informed written consent was obtained from the patient after a discussion of the risks, benefits and alternatives to treatment. A timeout was performed prior to the initiation of the procedure. Initial ultrasound scanning demonstrates a large amount of ascites within the right lower abdominal quadrant. The right lower abdomen was prepped and draped in the usual sterile fashion. 1% lidocaine was used for local anesthesia. Following this, a 6 Fr Safe-T-Centesis catheter was introduced. An ultrasound image was saved for documentation purposes. The paracentesis was performed. The catheter was removed and a dressing was applied. The patient tolerated the procedure well without immediate post procedural complication. FINDINGS: A total of approximately 6.6 L of bilirubin stained fluid was removed. Samples were sent to the laboratory as requested by the clinical team. IMPRESSION: Successful ultrasound-guided paracentesis yielding 6.6 liters of peritoneal fluid. Electronically Signed   By: Alcide Clever M.D.   On: 07/01/2019  13:21    ____________________________________________   PROCEDURES  Procedure(s) performed (including Critical Care):  Procedures   ____________________________________________   INITIAL IMPRESSION / ASSESSMENT AND PLAN / ED COURSE  Patient's belly pain is now gone after having his fluid drained.  I explained to the patient that his sodium is dangerously low and we should put him in the hospital as he is at risk for having seizures.  He understands this but does not want to stay in the hospital.  He understands he is leaving against my advice and that he could get sicker.  He says he will eat extra salt at home.  He is awake and alert he can understand and repeat the risks to me he is fully competent but he will not stay in the hospital.  I will wait till I get the body fluid cell count back and likely discharge him AMA.    Frederick Cabrera was evaluated in Emergency Department on 07/01/2019 for the symptoms described in the history of present illness. He was evaluated in the context of the global COVID-19 pandemic, which necessitated consideration that the patient might be at risk for infection with the SARS-CoV-2 virus that causes COVID-19. Institutional protocols and algorithms that pertain to the evaluation of patients at risk for COVID-19 are in a state of rapid change based on information released by regulatory bodies including the CDC and federal and state organizations. These policies and algorithms were followed during the patient's care in the ED.    Patient also wants to stop drinking alcohol.  This is a good idea in view of his hyponatremia.  He wants some Ativan to help make sure he does not go into DTs.  I will give him a taper for 6 days.  He will return if he has any problems.      ____________________________________________   FINAL CLINICAL IMPRESSION(S) / ED DIAGNOSES  Final diagnoses:  Bloating  Hyponatremia     ED Discharge Orders         Ordered     LORazepam (ATIVAN) 1 MG tablet  2 times daily     07/01/19 1534           Note:  This document was prepared using Dragon voice recognition software and may include unintentional dictation errors.    Arnaldo Natal, MD  07/01/19 1536  

## 2019-07-01 NOTE — Discharge Instructions (Addendum)
It really would be much safer if you stayed in the hospital.  Your blood sodium is very low and could give you a seizure.  Eating salt will help but might not be enough.  Additionally too much salt can make other problems worse.  I will give you some Ativan to help since you want to try and get off the alcohol.  I have given you a taper of Ativan. Please return for any further problems or if you change your mind about staying.

## 2019-07-01 NOTE — ED Triage Notes (Signed)
Pt reports hx of cirrhosis and has had abdominal distention for the last month worsening each day. Pt states he does not want to be admitted, he just wants to have the procedure to remove the fluid and go home.

## 2019-07-01 NOTE — ED Notes (Signed)
Pt leaving ama with discharge instructions. edp aware.

## 2019-07-01 NOTE — Procedures (Signed)
US paracentesis without difficulty  Complications:  None  Blood Loss: none  See dictation in canopy pacs  

## 2019-07-02 LAB — COMPREHENSIVE METABOLIC PANEL
ALT: 32 U/L (ref 0–44)
AST: 119 U/L — ABNORMAL HIGH (ref 15–41)
Albumin: 2.3 g/dL — ABNORMAL LOW (ref 3.5–5.0)
Alkaline Phosphatase: 160 U/L — ABNORMAL HIGH (ref 38–126)
Anion gap: 12 (ref 5–15)
BUN: 5 mg/dL — ABNORMAL LOW (ref 6–20)
CO2: 21 mmol/L — ABNORMAL LOW (ref 22–32)
Calcium: 8.1 mg/dL — ABNORMAL LOW (ref 8.9–10.3)
Chloride: 90 mmol/L — ABNORMAL LOW (ref 98–111)
Creatinine, Ser: 0.59 mg/dL — ABNORMAL LOW (ref 0.61–1.24)
GFR calc Af Amer: >60 mL/min (ref >60)
GFR calc non Af Amer: >60 mL/min (ref >60)
Glucose, Bld: 130 mg/dL — ABNORMAL HIGH (ref 70–99)
Potassium: 3.8 mmol/L (ref 3.5–5.1)
Sodium: 123 mmol/L — ABNORMAL LOW (ref 135–145)
Total Bilirubin: 13 mg/dL — ABNORMAL HIGH (ref 0.3–1.2)
Total Protein: 8.2 g/dL — ABNORMAL HIGH (ref 6.5–8.1)

## 2019-07-04 LAB — BODY FLUID CULTURE
Culture: NO GROWTH
Gram Stain: NONE SEEN
Special Requests: NORMAL

## 2019-07-05 LAB — CYTOLOGY - NON PAP

## 2019-07-08 ENCOUNTER — Other Ambulatory Visit: Payer: Self-pay

## 2019-07-08 ENCOUNTER — Observation Stay
Admission: EM | Admit: 2019-07-08 | Discharge: 2019-07-09 | Discharge status: Against Medical Advice | Payer: Medicaid Other | Attending: Internal Medicine | Admitting: Internal Medicine

## 2019-07-08 ENCOUNTER — Encounter: Payer: Self-pay | Admitting: Emergency Medicine

## 2019-07-08 ENCOUNTER — Emergency Department: Payer: Medicaid Other

## 2019-07-08 DIAGNOSIS — Z79899 Other long term (current) drug therapy: Secondary | ICD-10-CM | POA: Diagnosis not present

## 2019-07-08 DIAGNOSIS — K7031 Alcoholic cirrhosis of liver with ascites: Principal | ICD-10-CM | POA: Insufficient documentation

## 2019-07-08 DIAGNOSIS — R251 Tremor, unspecified: Secondary | ICD-10-CM | POA: Diagnosis not present

## 2019-07-08 DIAGNOSIS — Z20828 Contact with and (suspected) exposure to other viral communicable diseases: Secondary | ICD-10-CM | POA: Insufficient documentation

## 2019-07-08 DIAGNOSIS — K7290 Hepatic failure, unspecified without coma: Secondary | ICD-10-CM | POA: Diagnosis present

## 2019-07-08 DIAGNOSIS — R188 Other ascites: Secondary | ICD-10-CM | POA: Diagnosis present

## 2019-07-08 DIAGNOSIS — E871 Hypo-osmolality and hyponatremia: Secondary | ICD-10-CM | POA: Insufficient documentation

## 2019-07-08 DIAGNOSIS — D696 Thrombocytopenia, unspecified: Secondary | ICD-10-CM | POA: Diagnosis not present

## 2019-07-08 DIAGNOSIS — F17210 Nicotine dependence, cigarettes, uncomplicated: Secondary | ICD-10-CM | POA: Insufficient documentation

## 2019-07-08 DIAGNOSIS — I10 Essential (primary) hypertension: Secondary | ICD-10-CM | POA: Diagnosis not present

## 2019-07-08 DIAGNOSIS — K7460 Unspecified cirrhosis of liver: Secondary | ICD-10-CM

## 2019-07-08 LAB — CBC
Hemoglobin: 13.4 g/dL (ref 13.0–17.0)
Hemoglobin: 12 g/dL — ABNORMAL LOW (ref 13.0–17.0)
Platelets: 48 10*3/uL — ABNORMAL LOW (ref 150–400)
Platelets: 48 10*3/uL — ABNORMAL LOW (ref 150–400)
WBC: 7.1 10*3/uL (ref 4.0–10.5)
WBC: 7.7 10*3/uL (ref 4.0–10.5)
nRBC: 0 % (ref 0.0–0.2)
nRBC: 0 % (ref 0.0–0.2)

## 2019-07-08 LAB — COMPREHENSIVE METABOLIC PANEL
ALT: 37 U/L (ref 0–44)
ALT: 34 U/L (ref 0–44)
AST: 112 U/L — ABNORMAL HIGH (ref 15–41)
AST: 102 U/L — ABNORMAL HIGH (ref 15–41)
Albumin: 2.3 g/dL — ABNORMAL LOW (ref 3.5–5.0)
Albumin: 2 g/dL — ABNORMAL LOW (ref 3.5–5.0)
Alkaline Phosphatase: 177 U/L — ABNORMAL HIGH (ref 38–126)
Alkaline Phosphatase: 149 U/L — ABNORMAL HIGH (ref 38–126)
Anion gap: 13 (ref 5–15)
Anion gap: 9 (ref 5–15)
BUN: 8 mg/dL (ref 6–20)
BUN: 9 mg/dL (ref 6–20)
CO2: 21 mmol/L — ABNORMAL LOW (ref 22–32)
CO2: 22 mmol/L (ref 22–32)
Calcium: 8.4 mg/dL — ABNORMAL LOW (ref 8.9–10.3)
Calcium: 8.3 mg/dL — ABNORMAL LOW (ref 8.9–10.3)
Chloride: 88 mmol/L — ABNORMAL LOW (ref 98–111)
Chloride: 92 mmol/L — ABNORMAL LOW (ref 98–111)
Creatinine, Ser: 0.71 mg/dL (ref 0.61–1.24)
Creatinine, Ser: 0.57 mg/dL — ABNORMAL LOW (ref 0.61–1.24)
GFR calc Af Amer: >60 mL/min (ref >60)
GFR calc Af Amer: >60 mL/min (ref >60)
GFR calc non Af Amer: >60 mL/min (ref >60)
GFR calc non Af Amer: >60 mL/min (ref >60)
Glucose, Bld: 133 mg/dL — ABNORMAL HIGH (ref 70–99)
Glucose, Bld: 125 mg/dL — ABNORMAL HIGH (ref 70–99)
Potassium: 3.6 mmol/L (ref 3.5–5.1)
Potassium: 3.9 mmol/L (ref 3.5–5.1)
Sodium: 122 mmol/L — ABNORMAL LOW (ref 135–145)
Sodium: 123 mmol/L — ABNORMAL LOW (ref 135–145)
Total Bilirubin: 13.2 mg/dL — ABNORMAL HIGH (ref 0.3–1.2)
Total Bilirubin: 13.6 mg/dL — ABNORMAL HIGH (ref 0.3–1.2)
Total Protein: 8.3 g/dL — ABNORMAL HIGH (ref 6.5–8.1)
Total Protein: 7.7 g/dL (ref 6.5–8.1)

## 2019-07-08 LAB — CBC WITH DIFFERENTIAL/PLATELET
Abs Immature Granulocytes: 0.1 10*3/uL — ABNORMAL HIGH (ref 0.00–0.07)
Basophils Absolute: 0.1 10*3/uL (ref 0.0–0.1)
Basophils Relative: 1 %
Eosinophils Absolute: 0 10*3/uL (ref 0.0–0.5)
Eosinophils Relative: 0 %
Hemoglobin: 12.6 g/dL — ABNORMAL LOW (ref 13.0–17.0)
Immature Granulocytes: 1 %
Lymphocytes Relative: 10 %
Lymphs Abs: 0.8 10*3/uL (ref 0.7–4.0)
Monocytes Absolute: 0.9 10*3/uL (ref 0.1–1.0)
Monocytes Relative: 12 %
Neutro Abs: 6 10*3/uL (ref 1.7–7.7)
Neutrophils Relative %: 76 %
Platelets: 46 10*3/uL — ABNORMAL LOW (ref 150–400)
WBC: 7.8 10*3/uL (ref 4.0–10.5)
nRBC: 0 % (ref 0.0–0.2)

## 2019-07-08 LAB — URINALYSIS, COMPLETE (UACMP) WITH MICROSCOPIC
Bacteria, UA: NONE SEEN
Specific Gravity, Urine: 1.025 (ref 1.005–1.030)

## 2019-07-08 LAB — GLUCOSE, PLEURAL OR PERITONEAL FLUID: Glucose, Fluid: 133 mg/dL

## 2019-07-08 LAB — BODY FLUID CELL COUNT WITH DIFFERENTIAL
Eos, Fluid: 0 %
Lymphs, Fluid: 13 %
Monocyte-Macrophage-Serous Fluid: 87 %
Neutrophil Count, Fluid: 0 %
Total Nucleated Cell Count, Fluid: 113 cu mm

## 2019-07-08 LAB — TSH: TSH: 2.396 u[IU]/mL (ref 0.350–4.500)

## 2019-07-08 LAB — PHOSPHORUS: Phosphorus: UNDETERMINED mg/dL (ref 2.5–4.6)

## 2019-07-08 LAB — SODIUM
Sodium: 122 mmol/L — ABNORMAL LOW (ref 135–145)
Sodium: 122 mmol/L — ABNORMAL LOW (ref 135–145)

## 2019-07-08 LAB — OSMOLALITY: Osmolality: 266 mOsm/kg — ABNORMAL LOW (ref 275–295)

## 2019-07-08 LAB — PROTEIN, PLEURAL OR PERITONEAL FLUID: Total protein, fluid: <3 g/dL

## 2019-07-08 LAB — OSMOLALITY, URINE: Osmolality, Ur: 651 mOsm/kg (ref 300–900)

## 2019-07-08 LAB — CORTISOL: Cortisol, Plasma: 8.8 ug/dL

## 2019-07-08 LAB — AMMONIA: Ammonia: 53 umol/L — ABNORMAL HIGH (ref 9–35)

## 2019-07-08 LAB — GLUCOSE, CAPILLARY: Glucose-Capillary: 133 mg/dL — ABNORMAL HIGH (ref 70–99)

## 2019-07-08 LAB — LIPASE, BLOOD: Lipase: 136 U/L — ABNORMAL HIGH (ref 11–51)

## 2019-07-08 LAB — PROTIME-INR
INR: 1.7 — ABNORMAL HIGH (ref 0.8–1.2)
Prothrombin Time: 19.4 seconds — ABNORMAL HIGH (ref 11.4–15.2)

## 2019-07-08 LAB — SODIUM, URINE, RANDOM: Sodium, Ur: 44 mmol/L

## 2019-07-08 MED ORDER — LACTULOSE 10 GM/15ML PO SOLN
20.0000 g | Freq: Once | ORAL | Status: AC
  Administered 2019-07-08: 20 g via ORAL
  Filled 2019-07-08: qty 30

## 2019-07-08 MED ORDER — LORAZEPAM 2 MG/ML IJ SOLN
1.0000 mg | Freq: Once | INTRAMUSCULAR | Status: AC
  Administered 2019-07-08: 1 mg via INTRAVENOUS
  Filled 2019-07-08: qty 1

## 2019-07-08 MED ORDER — LORAZEPAM 2 MG/ML IJ SOLN: 1.0000 mg | INTRAMUSCULAR | Status: DC | PRN

## 2019-07-08 MED ORDER — FUROSEMIDE 40 MG PO TABS: 40.0000 mg | ORAL_TABLET | Freq: Every day | ORAL | Status: DC

## 2019-07-08 MED ORDER — ADULT MULTIVITAMIN W/MINERALS CH
1.0000 | ORAL_TABLET | Freq: Every day | ORAL | Status: DC
  Administered 2019-07-08 – 2019-07-09 (×2): 1 via ORAL
  Filled 2019-07-08 (×2): qty 1

## 2019-07-08 MED ORDER — FOLIC ACID 1 MG PO TABS
1.0000 mg | ORAL_TABLET | Freq: Every day | ORAL | Status: DC
  Administered 2019-07-08 – 2019-07-09 (×2): 1 mg via ORAL
  Filled 2019-07-08 (×2): qty 1

## 2019-07-08 MED ORDER — LORAZEPAM 1 MG PO TABS
1.0000 mg | ORAL_TABLET | Freq: Two times a day (BID) | ORAL | Status: DC
  Administered 2019-07-08 – 2019-07-09 (×2): 1 mg via ORAL
  Filled 2019-07-08 (×2): qty 1

## 2019-07-08 MED ORDER — LORAZEPAM 1 MG PO TABS
1.0000 mg | ORAL_TABLET | ORAL | Status: DC | PRN
  Administered 2019-07-09: 05:00:00 1 mg via ORAL
  Filled 2019-07-08: qty 1

## 2019-07-08 MED ORDER — LACTULOSE 10 GM/15ML PO SOLN
20.0000 g | Freq: Three times a day (TID) | ORAL | Status: DC
  Administered 2019-07-08 – 2019-07-09 (×2): 20 g via ORAL
  Filled 2019-07-08 (×2): qty 30

## 2019-07-08 MED ORDER — VITAMIN B-1 100 MG PO TABS
100.0000 mg | ORAL_TABLET | Freq: Every day | ORAL | Status: DC
  Administered 2019-07-09: 100 mg via ORAL
  Filled 2019-07-08: qty 1

## 2019-07-08 MED ORDER — NICOTINE 21 MG/24HR TD PT24
21.0000 mg | MEDICATED_PATCH | Freq: Every day | TRANSDERMAL | Status: DC
  Administered 2019-07-08 – 2019-07-09 (×2): 21 mg via TRANSDERMAL
  Filled 2019-07-08 (×2): qty 1

## 2019-07-08 MED ORDER — POLYETHYLENE GLYCOL 3350 17 G PO PACK: 17.0000 g | PACK | Freq: Every day | ORAL | Status: DC | PRN

## 2019-07-08 MED ORDER — SPIRONOLACTONE 100 MG PO TABS
100.0000 mg | ORAL_TABLET | Freq: Every day | ORAL | Status: DC
  Filled 2019-07-08: qty 1

## 2019-07-08 MED ORDER — PROPRANOLOL HCL 20 MG PO TABS
20.0000 mg | ORAL_TABLET | Freq: Two times a day (BID) | ORAL | Status: DC
  Administered 2019-07-08 – 2019-07-09 (×2): 20 mg via ORAL
  Filled 2019-07-08 (×3): qty 1

## 2019-07-08 NOTE — ED Notes (Signed)
Patient in Ultrasound.

## 2019-07-08 NOTE — ED Provider Notes (Signed)
[facility] Emergency Department Provider Note  ____________________________________________   First MD Initiated Contact with Patient 07/08/19 1349     (approximate)  I have reviewed the triage vital signs and the nursing notes.   HISTORY  Chief Complaint Abdominal Pain    HPI Frederick Cabrera is a 53 y.o. male with PMHx ascites here with abdominal pain and distension. Pt has long h/o chronic alcoholism and abdominal pain.  He states he currently does not have a physician.  He states that over the last week, since he was seen on 10/8, he has had generalized weakness, shakiness, and increasing abdominal pain with distention.  Said associated mild bloating and early satiety.  Has had increased leg swelling.  He has had some associated tremors, occasional headaches, but no seizures.  He has had decreased appetite due to this abdominal swelling.  He states his hernia has worsened as well.  Denies any vomiting.  No fevers or chills.  No urinary symptoms.  No other complaints.  He recently was approved for Medicare, and states he is amenable to admission as well as decreased alcohol intake and arranging follow-up with a gastroenterologist.  He states he has not had a drink in 24 hours and is beginning to feel moderately shaky.       Past Medical History:  Diagnosis Date  . Alcoholic cirrhosis of liver with ascites [facility])     Patient Active Problem List   Diagnosis Date Noted  . Hypoxia 03/08/2019  . Respiratory distress 03/08/2019  . Abdominal pain 02/14/2019  . Ascites due to alcoholic cirrhosis ([place]) 36/64/4034  . Alcoholic cirrhosis of liver with ascites ([place])   . Goals of care, counseling/discussion   . Palliative care by specialist   . Hematemesis 01/17/2019    Past Surgical History:  Procedure Laterality Date  . ESOPHAGOGASTRODUODENOSCOPY (EGD) WITH PROPOFOL N/A 01/17/2019   Procedure: ESOPHAGOGASTRODUODENOSCOPY (EGD) WITH PROPOFOL;  Surgeon: Toledo,  Benay Pike, MD;  Location: [facility] ENDOSCOPY;  Service: Gastroenterology;  Laterality: N/A;    Prior to Admission medications   Medication Sig Start Date End Date Taking? Authorizing Provider  folic acid (FOLVITE) 1 MG tablet Take 1 tablet (1 mg total) by mouth daily. 02/17/19   Lang Snow, NP  furosemide (LASIX) 40 MG tablet Take 40 mg by mouth daily.  02/11/19 02/11/20  [provider]  lactulose (CHRONULAC) 10 GM/15ML solution Take 30 mLs (20 g total) by mouth 3 (three) times daily. 02/16/19   Lang Snow, NP  LORazepam (ATIVAN) 1 MG tablet Take 1 tablet (1 mg total) by mouth 2 (two) times daily. Take 1 mg of Ativan 2 pills 3 times a day for 2 days then decrease to 1 mg of Ativan 2 pills twice a day for 2 days then decrease to 1 mg of Ativan 1 pill twice a day for 2 days then stop 07/01/19 06/30/20  Nena Polio, MD  Multiple Vitamin (MULTIVITAMIN WITH MINERALS) TABS tablet Take 1 tablet by mouth daily. 02/17/19   Lang Snow, NP  nicotine (NICODERM CQ - DOSED IN MG/24 HOURS) 21 mg/24hr patch Place 1 patch (21 mg total) onto the skin daily. 01/23/19   Bettey Costa, MD  pantoprazole (PROTONIX) 40 MG tablet Take 1 tablet (40 mg total) by mouth 2 (two) times daily before a meal. 01/23/19   Gladstone Lighter, MD  polyethylene glycol (MIRALAX / GLYCOLAX) 17 g packet Take 17 g by mouth daily as needed for mild constipation. 02/16/19  Jimmye Normanuma, Elizabeth Achieng, NP  propranolol (INDERAL) 20 MG tablet Take 20 mg by mouth 2 (two) times daily.    [provider]  spironolactone (ALDACTONE) 100 MG tablet Take 2 tablets (200 mg total) by mouth daily. Patient taking differently: Take 100 mg by mouth daily.  02/17/19   Jimmye Normanuma, Elizabeth Achieng, NP  thiamine 100 MG tablet Take 1 tablet (100 mg total) by mouth daily. 02/17/19   Jimmye Normanuma, Elizabeth Achieng, NP    Allergies Wool alcohol [lanolin]  Family History  Problem Relation Age of Onset  . Heart disease Father      Social History Social History   Tobacco Use  . Smoking status: Current Every Day Smoker    Packs/day: 0.50    Years: 20.00    Pack years: 10.00    Types: Cigarettes  . Smokeless tobacco: Never Used  Substance Use Topics  . Alcohol use: Yes    Comment: last drink 02/12/19  . Drug use: Not Currently    Review of Systems  Review of Systems  Constitutional: Positive for fatigue. Negative for chills and fever.  HENT: Negative for sore throat.   Respiratory: Negative for shortness of breath.   Cardiovascular: Negative for chest pain.  Gastrointestinal: Positive for abdominal distention, abdominal pain and nausea.  Genitourinary: Negative for flank pain.  Musculoskeletal: Negative for neck pain.  Skin: Negative for rash and wound.  Allergic/Immunologic: Negative for immunocompromised state.  Neurological: Positive for tremors and weakness. Negative for numbness.  Hematological: Does not bruise/bleed easily.  All other systems reviewed and are negative.    ____________________________________________  PHYSICAL EXAM:      VITAL SIGNS: ED Triage Vitals  Enc Vitals Group     BP 07/08/19 1207 (!) 154/88     Pulse Rate 07/08/19 1207 (!) 104     Resp 07/08/19 1207 17     Temp 07/08/19 1207 98.6 F (37 C)     Temp Source 07/08/19 1207 Oral     SpO2 07/08/19 1207 98 %     Weight 07/08/19 1208 197 lb (89.4 kg)     Height 07/08/19 1208 5\' 5"  (1.651 m)     Head Circumference --      Peak Flow --      Pain Score 07/08/19 1208 8     Pain Loc --      Pain Edu? --      Excl. in GC? --      Physical Exam Vitals signs and nursing note reviewed.  Constitutional:      General: He is not in acute distress.    Appearance: He is well-developed.  HENT:     Head: Normocephalic and atraumatic.  Eyes:     Conjunctiva/sclera: Conjunctivae normal.     Comments: Scleral icterus  Neck:     Musculoskeletal: Neck supple.  Cardiovascular:     Rate and Rhythm: Normal rate and regular  rhythm.     Heart sounds: Normal heart sounds. No murmur. No friction rub.  Pulmonary:     Effort: Pulmonary effort is normal. No respiratory distress.     Breath sounds: Normal breath sounds. No wheezing or rales.  Abdominal:     General: Abdomen is protuberant. There is distension.     Palpations: Abdomen is soft. There is shifting dullness and fluid wave.     Tenderness: There is generalized abdominal tenderness. There is no guarding or rebound.     Hernia: A hernia is present. Hernia is present in the  umbilical area.  Genitourinary:    Scrotum/Testes:        Right: Swelling present.        Left: Swelling present.  Skin:    General: Skin is warm.     Capillary Refill: Capillary refill takes less than 2 seconds.  Neurological:     Mental Status: He is alert and oriented to person, place, and time.     Motor: No abnormal muscle tone.       ____________________________________________   LABS (all labs ordered are listed, but only abnormal results are displayed)  Labs Reviewed  LIPASE, BLOOD - Abnormal; Notable for the following components:      Result Value   Lipase 136 (*)    All other components within normal limits  COMPREHENSIVE METABOLIC PANEL - Abnormal; Notable for the following components:   Sodium 122 (*)    Chloride 88 (*)    CO2 21 (*)    Glucose, Bld 133 (*)    Calcium 8.4 (*)    Total Protein 8.3 (*)    Albumin 2.3 (*)    AST 112 (*)    Alkaline Phosphatase 177 (*)    Total Bilirubin 13.2 (*)    All other components within normal limits  CBC - Abnormal; Notable for the following components:   Platelets 48 (*)    All other components within normal limits  URINALYSIS, COMPLETE (UACMP) WITH MICROSCOPIC - Abnormal; Notable for the following components:   Color, Urine BROWN (*)    APPearance CLEAR (*)    Glucose, UA   (*)    Value: TEST NOT REPORTED DUE TO COLOR INTERFERENCE OF URINE PIGMENT   Hgb urine dipstick   (*)    Value: TEST NOT REPORTED DUE TO  COLOR INTERFERENCE OF URINE PIGMENT   Bilirubin Urine   (*)    Value: TEST NOT REPORTED DUE TO COLOR INTERFERENCE OF URINE PIGMENT   Ketones, ur   (*)    Value: TEST NOT REPORTED DUE TO COLOR INTERFERENCE OF URINE PIGMENT   Protein, ur   (*)    Value: TEST NOT REPORTED DUE TO COLOR INTERFERENCE OF URINE PIGMENT   Nitrite   (*)    Value: TEST NOT REPORTED DUE TO COLOR INTERFERENCE OF URINE PIGMENT   Leukocytes,Ua   (*)    Value: TEST NOT REPORTED DUE TO COLOR INTERFERENCE OF URINE PIGMENT   All other components within normal limits  AMMONIA - Abnormal; Notable for the following components:   Ammonia 53 (*)    All other components within normal limits  PROTIME-INR - Abnormal; Notable for the following components:   Prothrombin Time 19.4 (*)    INR 1.7 (*)    All other components within normal limits  SARS CORONAVIRUS 2 (TAT 6-24 HRS)  CBC WITH DIFFERENTIAL/PLATELET    ____________________________________________  EKG: None ________________________________________  RADIOLOGY All imaging, including plain films, CT scans, and ultrasounds, independently reviewed by me, and interpretations confirmed via formal radiology reads.  ED MD interpretation:    Official radiology report(s): No results found.  ____________________________________________  PROCEDURES   Procedure(s) performed (including Critical Care):  Procedures  ____________________________________________  INITIAL IMPRESSION / MDM / ASSESSMENT AND PLAN / ED COURSE  As part of my medical decision making, I reviewed the following data within the electronic MEDICAL RECORD NUMBER Notes from prior ED visits and [place] Controlled Substance Database      *Randeep Biondolillo was evaluated in Emergency Department on 07/08/2019 for the symptoms described  in the history of present illness. He was evaluated in the context of the global COVID-19 pandemic, which necessitated consideration that the patient might be at risk for infection  with the SARS-CoV-2 virus that causes COVID-19. Institutional protocols and algorithms that pertain to the evaluation of patients at risk for COVID-19 are in a state of rapid change based on information released by regulatory bodies including the CDC and federal and state organizations. These policies and algorithms were followed during the patient's care in the ED.  Some ED evaluations and interventions may be delayed as a result of limited staffing during the pandemic.*      Medical Decision Making:  53 yo M with PMHx above here with abd pain, shakiness, confusion. Labs c/w likely decompensated alcoholic cirrhosis, with additional contribution of hyponatremia and mild hyperammonemia causing mild encephalopathy. U/S Para ordered, will admit for further management. Lactulose given. No fever or signs of sepsis.  ____________________________________________  FINAL CLINICAL IMPRESSION(S) / ED DIAGNOSES  Final diagnoses:  Ascites  Decompensated hepatic cirrhosis (HCC)  Hyponatremia     MEDICATIONS GIVEN DURING THIS VISIT:  Medications  LORazepam (ATIVAN) injection 1 mg (1 mg Intravenous Given 07/08/19 1433)  lactulose (CHRONULAC) 10 GM/15ML solution 20 g (20 g Oral Given 07/08/19 1433)     ED Discharge Orders    None       Note:  This document was prepared using Dragon voice recognition software and may include unintentional dictation errors.   Shaune Pollack, MD 07/08/19 [phone]

## 2019-07-08 NOTE — H&P (Signed)
[place] at [place] NAME: Frederick Cabrera    MR#:  [id number]  DATE OF BIRTH:  02-20-1966  DATE OF ADMISSION:  07/08/2019  PRIMARY CARE PHYSICIAN: [place]   REQUESTING/REFERRING PHYSICIAN: Duffy Bruce  CHIEF COMPLAINT:   Chief Complaint  Patient presents with  . Abdominal Pain    HISTORY OF PRESENT ILLNESS:  Frederick Cabrera  is a 53 y.o. male with a known history of alcoholic liver cirrhosis with ascites with recurrent paracentesis, hypertension, tobacco abuse and chronic alcoholism who presented to the emergency room with complaints of abdominal pain and increasing abdominal distention.  No fevers.  No nausea vomiting.  No GI bleed.  Patient was evaluated in the emergency room and said to have some mild tremors.  No seizure.  Patient's last drink was yesterday.  Given degree of ascites on clinical exam emergency room provider requested for ultrasound-guided paracentesis which was already done prior to my evaluation with significant improvement in abdominal distention.  Currently denies abdominal pain.  Laboratory studies done revealed sodium of 122.  Medical service called to admit patient.  PAST MEDICAL HISTORY:   Past Medical History:  Diagnosis Date  . Alcoholic cirrhosis of liver with ascites ([place])     PAST SURGICAL HISTORY:   Past Surgical History:  Procedure Laterality Date  . ESOPHAGOGASTRODUODENOSCOPY (EGD) WITH PROPOFOL N/A 01/17/2019   Procedure: ESOPHAGOGASTRODUODENOSCOPY (EGD) WITH PROPOFOL;  Surgeon: Toledo, Benay Pike, MD;  Location: [facility] ENDOSCOPY;  Service: Gastroenterology;  Laterality: N/A;    SOCIAL HISTORY:   Social History   Tobacco Use  . Smoking status: Current Every Day Smoker    Packs/day: 0.50    Years: 20.00    Pack years: 10.00    Types: Cigarettes  . Smokeless tobacco: Never Used  Substance Use Topics  . Alcohol use: Yes    Comment: last drink 02/12/19    FAMILY HISTORY:    Family History  Problem Relation Age of Onset  . Heart disease Father     DRUG ALLERGIES:   Allergies  Allergen Reactions  . Wool Alcohol [Lanolin] Rash    REVIEW OF SYSTEMS:   Review of Systems  Constitutional: Negative for chills and fever.  HENT: Negative for hearing loss and tinnitus.   Eyes: Negative for blurred vision and double vision.  Respiratory: Negative for cough and shortness of breath.   Cardiovascular: Negative for chest pain and palpitations.  Gastrointestinal: Positive for abdominal pain. Negative for heartburn, nausea and vomiting.       Abdominal distention which is significantly improved following paracentesis done today  Genitourinary: Negative for dysuria and urgency.  Musculoskeletal: Negative for myalgias and neck pain.  Skin: Negative for itching and rash.  Neurological: Negative for dizziness and headaches.  Psychiatric/Behavioral: Negative for depression and hallucinations.    MEDICATIONS AT HOME:   Prior to Admission medications   Medication Sig Start Date End Date Taking? Authorizing Provider  furosemide (LASIX) 40 MG tablet Take 40 mg by mouth daily.  02/11/19 02/11/20 Yes [provider]  lactulose (CHRONULAC) 10 GM/15ML solution Take 30 mLs (20 g total) by mouth 3 (three) times daily. 02/16/19  Yes Lang Snow, NP  LORazepam (ATIVAN) 1 MG tablet Take 1 tablet (1 mg total) by mouth 2 (two) times daily. Take 1 mg of Ativan 2 pills 3 times a day for 2 days then decrease to 1 mg of Ativan 2 pills twice a day for 2 days then decrease  to 1 mg of Ativan 1 pill twice a day for 2 days then stop 07/01/19 06/30/20 Yes Arnaldo Natal, MD  spironolactone (ALDACTONE) 100 MG tablet Take 2 tablets (200 mg total) by mouth daily. Patient taking differently: Take 100 mg by mouth daily.  02/17/19  Yes Jimmye Norman, NP  folic acid (FOLVITE) 1 MG tablet Take 1 tablet (1 mg total) by mouth daily. Patient not taking: Reported on 07/08/2019  02/17/19   Jimmye Norman, NP  Multiple Vitamin (MULTIVITAMIN WITH MINERALS) TABS tablet Take 1 tablet by mouth daily. Patient not taking: Reported on 07/08/2019 02/17/19   Jimmye Norman, NP  nicotine (NICODERM CQ - DOSED IN MG/24 HOURS) 21 mg/24hr patch Place 1 patch (21 mg total) onto the skin daily. 01/23/19   Adrian Saran, MD  pantoprazole (PROTONIX) 40 MG tablet Take 1 tablet (40 mg total) by mouth 2 (two) times daily before a meal. Patient not taking: Reported on 07/08/2019 01/23/19   Enid Baas, MD  polyethylene glycol (MIRALAX / GLYCOLAX) 17 g packet Take 17 g by mouth daily as needed for mild constipation. 02/16/19   Jimmye Norman, NP  propranolol (INDERAL) 20 MG tablet Take 20 mg by mouth 2 (two) times daily.    [provider]  thiamine 100 MG tablet Take 1 tablet (100 mg total) by mouth daily. Patient not taking: Reported on 07/08/2019 02/17/19   Jimmye Norman, NP      VITAL SIGNS:  Blood pressure 130/89, pulse 93, temperature 98.6 F (37 C), temperature source Oral, resp. rate 17, height 5\' 5"  (1.651 m), weight 89.4 kg, SpO2 97 %.  PHYSICAL EXAMINATION:  Physical Exam  GENERAL:  53 y.o.-year-old patient lying in the bed with no acute distress.  EYES: Pupils equal, round, reactive to light and accommodation. No scleral icterus. Extraocular muscles intact.  HEENT: Head atraumatic, normocephalic. Oropharynx and nasopharynx clear.  NECK:  Supple, no jugular venous distention. No thyroid enlargement, no tenderness.  LUNGS: Normal breath sounds bilaterally, no wheezing, rales,rhonchi or crepitation. No use of accessory muscles of respiration.  CARDIOVASCULAR: S1, S2 normal. No murmurs, rubs, or gallops.  ABDOMEN: Soft, nontender, nondistended following paracentesis done today.. Bowel sounds present. No organomegaly or mass.  EXTREMITIES: No pedal edema, cyanosis, or clubbing.  NEUROLOGIC: Cranial nerves II through XII are intact.  Muscle strength 5/5 in all extremities. Sensation intact. Gait not checked.  PSYCHIATRIC: The patient is alert and oriented x 3.  SKIN: No obvious rash, lesion, or ulcer.   LABORATORY PANEL:   CBC Recent Labs  Lab 07/08/19 1424  WBC 7.8  HGB 12.6*  HCT RESULTS UNAVAILABLE DUE TO INTERFERING SUBSTANCE  PLT 46*   ------------------------------------------------------------------------------------------------------------------  Chemistries  Recent Labs  Lab 07/08/19 1210  NA 122*  K 3.6  CL 88*  CO2 21*  GLUCOSE 133*  BUN 8  CREATININE 0.71  CALCIUM 8.4*  AST 112*  ALT 37  ALKPHOS 177*  BILITOT 13.2*   ------------------------------------------------------------------------------------------------------------------  Cardiac Enzymes No results for input(s): TROPONINI in the last 168 hours. ------------------------------------------------------------------------------------------------------------------  RADIOLOGY:  No results found.    IMPRESSION AND PLAN:  Patient is a 53 year old male with history of alcoholic liver cirrhosis with ascites requiring recurrent paracentesis who presented to the emergency room with abdominal pain secondary to worsening ascites  1.  Large volume ascites Patient presented with abdominal pain and distention. Paracentesis already done with ultrasound guidance prior to my evaluation with significant improvement in abdominal distention and pain. Continue  home dose of Lasix and spironolactone.  Monitor clinically for  2.  Hyponatremia with sodium of 122 Likely due to continued alcohol use. Requested for hyponatremia work-up. Follow-up for repeat sodium level Anticipate discharge tomorrow if patient clinically and hemodynamically stable.  And sodium level improved  3.  Chronic alcoholism Patient still drinks at least 6 packs of beer every day.  Last drink was yesterday. Trying to cut down. Having some tremors already.   Placed on CIWA  protocol.  Fever thiamine, multivitamin and folic acid. 4.  Thrombocytopenia with platelet count of 46,000. Secondary to continued alcohol abuse and liver cirrhosis No evidence of bleeding.  Monitor  5.  Tobacco abuse Patient smokes about half a pack of cigarettes per day.  Smoking cessation counseling done.  Patient motivated to cut down. Placed on nicotine patch  DVT prophylaxis; SCDs No heparin products due to thrombocytopenia   All the records are reviewed and case discussed with ED provider. Management plans discussed with the patient, family and they are in agreement.  CODE STATUS: Full code  TOTAL TIME TAKING CARE OF THIS PATIENT: 56 minutes.    Estrella Alcaraz M.D on 07/08/2019 at 5:18 PM  Between 7am to 6pm - Pager - [phone]  After 6pm go to [email or web address] - Social research officer, governmentpassword [email or web address]  [employer] [place] [facility]  Office  [phone]  CC: Primary care physician; [facility]   Note: This dictation was prepared with Dragon dictation along with smaller phrase technology. Any transcriptional errors that result from this process are unintentional.

## 2019-07-08 NOTE — ED Notes (Signed)
Attempted to call report without success

## 2019-07-08 NOTE — ED Triage Notes (Signed)
Pt arrives with complaints of abdominal pain. Pt states "I got cirrhosis and I need my stomach drained." Pt unsure of last paracentesis. Abdomen appears distended.

## 2019-07-09 LAB — BASIC METABOLIC PANEL
Anion gap: 9 (ref 5–15)
BUN: 9 mg/dL (ref 6–20)
CO2: 22 mmol/L (ref 22–32)
Calcium: 8.8 mg/dL — ABNORMAL LOW (ref 8.9–10.3)
Chloride: 93 mmol/L — ABNORMAL LOW (ref 98–111)
Creatinine, Ser: 0.59 mg/dL — ABNORMAL LOW (ref 0.61–1.24)
GFR calc Af Amer: >60 mL/min (ref >60)
GFR calc non Af Amer: >60 mL/min (ref >60)
Glucose, Bld: 119 mg/dL — ABNORMAL HIGH (ref 70–99)
Potassium: 3.8 mmol/L (ref 3.5–5.1)
Sodium: 124 mmol/L — ABNORMAL LOW (ref 135–145)

## 2019-07-09 LAB — PROTEIN, BODY FLUID (OTHER): Total Protein, Body Fluid Other: 1.2 g/dL

## 2019-07-09 LAB — PATHOLOGIST SMEAR REVIEW

## 2019-07-09 LAB — MAGNESIUM: Magnesium: 1.9 mg/dL (ref 1.7–2.4)

## 2019-07-09 LAB — SARS CORONAVIRUS 2 (TAT 6-24 HRS): SARS Coronavirus 2: NEGATIVE

## 2019-07-09 NOTE — Progress Notes (Signed)
pt wants to go out and smoke.  Told him that I cannot give those privileges to him.  Recheck the sodium.  If improves likely discharge home.  Otherwise patient need to stay in the hospital and will consider nephrology consult hyponatremia.

## 2019-07-09 NOTE — Progress Notes (Signed)
07/09/2019 11:34 AM  Patient wanted to leave AMA.  I explained what that meant and that he would be leaving against the advice of his doctor, who was not discharging him.  He insisted he needed to leave right then.  I removed his IV and gave him AMA papers to sign.  Escorted him to the exit to wait for his ride, who he said was on the way.  Dola Argyle, RN

## 2019-07-12 LAB — BODY FLUID CULTURE: Culture: NO GROWTH

## 2019-07-13 ENCOUNTER — Encounter: Payer: Self-pay | Admitting: Gastroenterology

## 2019-07-13 ENCOUNTER — Ambulatory Visit: Payer: Medicaid Other | Admitting: Gastroenterology

## 2019-07-13 NOTE — Progress Notes (Deleted)
Jonathon Bellows MD, MRCP(U.K) [address]  [place]  [place], [place] [address]  Main: [phone]  Fax: [phone]   Primary Care Physician: [place]  Primary Gastroenterologist:  Dr. Harland German discharge follow-up for chronic liver disease  HPI: Frederick Cabrera is a 53 y.o. male    Summary of history :  He has a history of decompensated cirrhosis with ascites secondary to alcohol.  Nonbleeding esophageal varices.  He was admitted to [facility] in April 2020 and found to have large esophageal varices grade 2 which were not bleeding and were not banded.  And hypertensive gastropathy was noted as well.  He had previously been taken care of by Dr. Alice Reichert as an outpatient and has been on diuretics.  As per his note in April 2020 the patient had been drinking 12 packs of beer per day and some occasional vodka.  In May 2020 he was admitted with abdominal distention and ascites. During his admission in May 2020 I had seen him as well and he was having 2 soft bowel movements a day and no hepatic encephalopathy.  I have suggested to increase his Aldactone to 200 mg a day and Lasix to maintain at 40 mg a day.  Previous labs in February 2020: Hepatitis B surface antigen, hepatitis C antibody negative.  B12 greater than thousand.  Cannot find any autoimmune liver disease work-up or test for genetic disorders for liver disease previously.  Recent lab: Sodium 124 creatinine 0.59, TSH 2.396, albumin 2, AST 102, ALT 34, alkaline phosphatase 149, total bilirubin 13.6.  No evidence of SBP on ascitic fluid.  INR 1.7.  Hemoglobin 12.2 g with an MCV of 98.2.   Interval history   ***/***/2020-  ***/***/2020  ***   Current Outpatient Medications  Medication Sig Dispense Refill   folic acid (FOLVITE) 1 MG tablet Take 1 tablet (1 mg total) by mouth daily. (Patient not taking: Reported on 07/08/2019) 30 tablet 0   furosemide (LASIX) 40 MG tablet Take 40 mg by mouth daily.       lactulose (CHRONULAC) 10 GM/15ML solution Take 30 mLs (20 g total) by mouth 3 (three) times daily. 236 mL 0   LORazepam (ATIVAN) 1 MG tablet Take 1 tablet (1 mg total) by mouth 2 (two) times daily. Take 1 mg of Ativan 2 pills 3 times a day for 2 days then decrease to 1 mg of Ativan 2 pills twice a day for 2 days then decrease to 1 mg of Ativan 1 pill twice a day for 2 days then stop 12 tablet 0   Multiple Vitamin (MULTIVITAMIN WITH MINERALS) TABS tablet Take 1 tablet by mouth daily. (Patient not taking: Reported on 07/08/2019) 30 tablet 0   nicotine (NICODERM CQ - DOSED IN MG/24 HOURS) 21 mg/24hr patch Place 1 patch (21 mg total) onto the skin daily. 28 patch 0   pantoprazole (PROTONIX) 40 MG tablet Take 1 tablet (40 mg total) by mouth 2 (two) times daily before a meal. (Patient not taking: Reported on 07/08/2019) 60 tablet 2   polyethylene glycol (MIRALAX / GLYCOLAX) 17 g packet Take 17 g by mouth daily as needed for mild constipation. 14 each 0   propranolol (INDERAL) 20 MG tablet Take 20 mg by mouth 2 (two) times daily.     spironolactone (ALDACTONE) 100 MG tablet Take 2 tablets (200 mg total) by mouth daily. (Patient taking differently: Take 100 mg by mouth daily. ) 60 tablet 0  thiamine 100 MG tablet Take 1 tablet (100 mg total) by mouth daily. (Patient not taking: Reported on 07/08/2019) 30 tablet 0   No current facility-administered medications for this visit.     Allergies as of 07/13/2019 - Review Complete 07/08/2019  Allergen Reaction Noted   Wool alcohol [lanolin] Rash 02/05/2019    ROS:  General: Negative for anorexia, weight loss, fever, chills, fatigue, weakness. ENT: Negative for hoarseness, difficulty swallowing , nasal congestion. CV: Negative for chest pain, angina, palpitations, dyspnea on exertion, peripheral edema.  Respiratory: Negative for dyspnea at rest, dyspnea on exertion, cough, sputum, wheezing.  GI: See history of present illness. GU:  Negative for  dysuria, hematuria, urinary incontinence, urinary frequency, nocturnal urination.  Endo: Negative for unusual weight change.    Physical Examination:   There were no vitals taken for this visit.  General: Well-nourished, well-developed in no acute distress.  Eyes: No icterus. Conjunctivae pink. Mouth: Oropharyngeal mucosa moist and pink , no lesions erythema or exudate. Lungs: Clear to auscultation bilaterally. Non-labored. Heart: Regular rate and rhythm, no murmurs rubs or gallops.  Abdomen: Bowel sounds are normal, nontender, nondistended, no hepatosplenomegaly or masses, no abdominal bruits or hernia , no rebound or guarding.   Extremities: No lower extremity edema. No clubbing or deformities. Neuro: Alert and oriented x 3.  Grossly intact. Skin: Warm and dry, no jaundice.   Psych: Alert and cooperative, normal mood and affect.   Imaging Studies: US Paracentesis  Result Date: 07/09/2019 INDICATION: 53 year old male with a history of a cirrhosis and recurrent ascites EXAM: ULTRASOUND GUIDED  PARACENTESIS MEDICATIONS: None. COMPLICATIONS: None PROCEDURE: Informed written consent was obtained from the patient after a discussion of the risks, benefits and alternatives to treatment. A timeout was performed prior to the initiation of the procedure. Initial ultrasound scanning demonstrates a moderate amount of ascites within the right lower abdominal quadrant. The right lower abdomen was prepped and draped in the usual sterile fashion. 1% lidocaine was used for local anesthesia. Following this, a 8 Fr Safe-T-Centesis cath catheter was introduced. An ultrasound image was saved for documentation purposes. The paracentesis was performed. The catheter was removed and a dressing was applied. The patient tolerated the procedure well without immediate post procedural complication. Patient received post-procedure intravenous albumin; see nursing notes for details. FINDINGS: A total of approximately 4.6 L  of thin yellow fluid was removed. IMPRESSION: Status post ultrasound-guided paracentesis Electronically Signed   By: Gilmer Mor D.O.   On: 07/09/2019 07:59   US Paracentesis  Result Date: 07/01/2019 INDICATION: Recurrent ascites EXAM: ULTRASOUND GUIDED  PARACENTESIS MEDICATIONS: None. COMPLICATIONS: None immediate. PROCEDURE: Informed written consent was obtained from the patient after a discussion of the risks, benefits and alternatives to treatment. A timeout was performed prior to the initiation of the procedure. Initial ultrasound scanning demonstrates a large amount of ascites within the right lower abdominal quadrant. The right lower abdomen was prepped and draped in the usual sterile fashion. 1% lidocaine was used for local anesthesia. Following this, a 6 Fr Safe-T-Centesis catheter was introduced. An ultrasound image was saved for documentation purposes. The paracentesis was performed. The catheter was removed and a dressing was applied. The patient tolerated the procedure well without immediate post procedural complication. FINDINGS: A total of approximately 6.6 L of bilirubin stained fluid was removed. Samples were sent to the laboratory as requested by the clinical team. IMPRESSION: Successful ultrasound-guided paracentesis yielding 6.6 liters of peritoneal fluid. Electronically Signed   By: Eulah Pont.D.  On: 07/01/2019 13:21    Assessment and Plan:   Frederick Cabrera is a 53 y.o. 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  No prior autoimmune disease work-up has been initiated.  Previously followed by coronary clinic and has transferred care to see us today.  Plan 1.  Low-salt diet 2.  Complete autoimmune liver disease, genetic disorders evaluation. 3.  Right upper quadrant ultrasound to screen for HCC. 4.  Stop alcohol consumption and  attend  alcoholic anonymous meetings. 5.  Diuretics: 6.  Diagnostic and therapeutic paracentesis 7.  Lactulose titrated to 2 soft bowel movements per day.  Avoid excess use of lactulose if it is causing diarrhea as diarrhea by itself can worsen hepatic encephalopathy due to dehydration.  Lactulose needs to be used just enough to provide 2 soft bowel movements per day.  Please do not check serum ammonia it has no utility in the management hepatic encephalopathy as an outpatient.    Dr [name] Braden Cimo  MD,MRCP [facility](U.K) Follow up in ***

## 2019-07-17 ENCOUNTER — Emergency Department
Admission: EM | Admit: 2019-07-17 | Discharge: 2019-07-17 | Discharge status: Against Medical Advice | Payer: Medicaid Other | Attending: Emergency Medicine | Admitting: Emergency Medicine

## 2019-07-17 ENCOUNTER — Emergency Department: Payer: Medicaid Other

## 2019-07-17 ENCOUNTER — Other Ambulatory Visit: Payer: Self-pay

## 2019-07-17 DIAGNOSIS — F17210 Nicotine dependence, cigarettes, uncomplicated: Secondary | ICD-10-CM | POA: Insufficient documentation

## 2019-07-17 DIAGNOSIS — Z79899 Other long term (current) drug therapy: Secondary | ICD-10-CM | POA: Diagnosis not present

## 2019-07-17 DIAGNOSIS — Z5329 Procedure and treatment not carried out because of patient's decision for other reasons: Secondary | ICD-10-CM | POA: Diagnosis not present

## 2019-07-17 DIAGNOSIS — R0602 Shortness of breath: Secondary | ICD-10-CM | POA: Diagnosis present

## 2019-07-17 DIAGNOSIS — Z20828 Contact with and (suspected) exposure to other viral communicable diseases: Secondary | ICD-10-CM | POA: Diagnosis not present

## 2019-07-17 DIAGNOSIS — K7031 Alcoholic cirrhosis of liver with ascites: Secondary | ICD-10-CM

## 2019-07-17 LAB — URINE DRUG SCREEN, QUALITATIVE (ARMC ONLY)
Amphetamines, Ur Screen: NOT DETECTED
Barbiturates, Ur Screen: NOT DETECTED
Benzodiazepine, Ur Scrn: NOT DETECTED
Cannabinoid 50 Ng, Ur ~~LOC~~: NOT DETECTED
Cocaine Metabolite,Ur ~~LOC~~: NOT DETECTED
MDMA (Ecstasy)Ur Screen: NOT DETECTED
Methadone Scn, Ur: NOT DETECTED
Opiate, Ur Screen: NOT DETECTED
Phencyclidine (PCP) Ur S: NOT DETECTED
Tricyclic, Ur Screen: NOT DETECTED

## 2019-07-17 LAB — COMPREHENSIVE METABOLIC PANEL
ALT: 42 U/L (ref 0–44)
AST: 113 U/L — ABNORMAL HIGH (ref 15–41)
Albumin: 2.3 g/dL — ABNORMAL LOW (ref 3.5–5.0)
Alkaline Phosphatase: 170 U/L — ABNORMAL HIGH (ref 38–126)
Anion gap: 10 (ref 5–15)
BUN: 10 mg/dL (ref 6–20)
CO2: 21 mmol/L — ABNORMAL LOW (ref 22–32)
Calcium: 8.3 mg/dL — ABNORMAL LOW (ref 8.9–10.3)
Chloride: 93 mmol/L — ABNORMAL LOW (ref 98–111)
Creatinine, Ser: 0.62 mg/dL (ref 0.61–1.24)
GFR calc Af Amer: >60 mL/min (ref >60)
GFR calc non Af Amer: >60 mL/min (ref >60)
Glucose, Bld: 132 mg/dL — ABNORMAL HIGH (ref 70–99)
Potassium: 4.4 mmol/L (ref 3.5–5.1)
Sodium: 124 mmol/L — ABNORMAL LOW (ref 135–145)
Total Bilirubin: 9.3 mg/dL — ABNORMAL HIGH (ref 0.3–1.2)
Total Protein: 8.1 g/dL (ref 6.5–8.1)

## 2019-07-17 LAB — SARS CORONAVIRUS 2 (TAT 6-24 HRS): SARS Coronavirus 2: NEGATIVE

## 2019-07-17 LAB — URINALYSIS, COMPLETE (UACMP) WITH MICROSCOPIC
Bacteria, UA: NONE SEEN
Bilirubin Urine: NEGATIVE
Glucose, UA: NEGATIVE mg/dL
Ketones, ur: NEGATIVE mg/dL
Leukocytes,Ua: NEGATIVE
Nitrite: NEGATIVE
Protein, ur: NEGATIVE mg/dL
Specific Gravity, Urine: 1.017 (ref 1.005–1.030)
pH: 5 (ref 5.0–8.0)

## 2019-07-17 LAB — CBC
HCT: 32.8 % — ABNORMAL LOW (ref 39.0–52.0)
Hemoglobin: 12 g/dL — ABNORMAL LOW (ref 13.0–17.0)
MCH: 36.6 pg — ABNORMAL HIGH (ref 26.0–34.0)
MCHC: 36.6 g/dL — ABNORMAL HIGH (ref 30.0–36.0)
MCV: 100 fL (ref 80.0–100.0)
Platelets: 62 10*3/uL — ABNORMAL LOW (ref 150–400)
RBC: 3.28 MIL/uL — ABNORMAL LOW (ref 4.22–5.81)
RDW: 17 % — ABNORMAL HIGH (ref 11.5–15.5)
WBC: 8.6 10*3/uL (ref 4.0–10.5)
nRBC: 0 % (ref 0.0–0.2)

## 2019-07-17 LAB — LIPASE, BLOOD: Lipase: 127 U/L — ABNORMAL HIGH (ref 11–51)

## 2019-07-17 LAB — ETHANOL: Alcohol, Ethyl (B): 99 mg/dL — ABNORMAL HIGH (ref <10)

## 2019-07-17 LAB — LIPASE, FLUID: Lipase-Fluid: 82 U/L

## 2019-07-17 MED ORDER — ADULT MULTIVITAMIN W/MINERALS CH
1.0000 | ORAL_TABLET | Freq: Every day | ORAL | Status: DC
  Administered 2019-07-17: 12:00:00 1 via ORAL
  Filled 2019-07-17: qty 1

## 2019-07-17 MED ORDER — FOLIC ACID 1 MG PO TABS
1.0000 mg | ORAL_TABLET | Freq: Every day | ORAL | Status: DC
  Administered 2019-07-17: 1 mg via ORAL
  Filled 2019-07-17: qty 1

## 2019-07-17 MED ORDER — VITAMIN B-1 100 MG PO TABS
100.0000 mg | ORAL_TABLET | Freq: Every day | ORAL | Status: DC
  Administered 2019-07-17: 12:00:00 100 mg via ORAL
  Filled 2019-07-17: qty 1

## 2019-07-17 MED ORDER — PANTOPRAZOLE SODIUM 40 MG IV SOLR
40.0000 mg | Freq: Once | INTRAVENOUS | Status: AC
  Administered 2019-07-17: 40 mg via INTRAVENOUS
  Filled 2019-07-17: qty 40

## 2019-07-17 MED ORDER — THIAMINE HCL 100 MG/ML IJ SOLN: 100.0000 mg | Freq: Every day | INTRAMUSCULAR | Status: DC

## 2019-07-17 NOTE — Discharge Instructions (Addendum)
Please return right away if you have increased trouble breathing, your swelling worsens, you develop a fever, pain, or other new concerns arise.  Please return to the emergency room right away if you are to develop a fever, severe nausea, your pain becomes severe or worsens, you are unable to keep food down, begin vomiting any dark or bloody fluid, you develop any dark or bloody stools, feel dehydrated, or other new concerns or symptoms arise.

## 2019-07-17 NOTE — ED Notes (Addendum)
EDP at bedside. EDP came out and stating pt wanted to sign out AMA.

## 2019-07-17 NOTE — ED Provider Notes (Signed)
[facility] Emergency Department Provider Note  ____________________________________________   First MD Initiated Contact with Patient 07/17/19 1105     (approximate)  I have reviewed the triage vital signs and the nursing notes.   HISTORY  Chief Complaint Shortness of Breath and Bloated    HPI Frederick Cabrera is a 53 y.o. male here for evaluation of abdominal swelling  Patient reports that this happen once before, same symptoms.  Last night he noticed that his stomach was starting to feel tense.  Continue to be tense to the point of starting to make him feel little short of breath specially if he tries to walk or exert himself.  He is okay at rest, but also reports that he is at risk for going through withdrawals.  He drank 2 beers right before he came in today.  He also reports having some nausea and feeling constipated.  No fevers or chills.  Has a hernia at his bellybutton that is been sticking out more than normal, also reports this happened when he was admitted to the hospital for the same and told his liver was problem  Denies any exposure to Covid.   Past Medical History:  Diagnosis Date   Alcoholic cirrhosis of liver with ascites [facility])     Patient Active Problem List   Diagnosis Date Noted   Ascites 07/08/2019   Hypoxia 03/08/2019   Respiratory distress 03/08/2019   Abdominal pain 02/14/2019   Ascites due to alcoholic cirrhosis (HCC) 02/13/2019   Alcoholic cirrhosis of liver with ascites (HCC)    Goals of care, counseling/discussion    Palliative care by specialist    Hematemesis 01/17/2019    Past Surgical History:  Procedure Laterality Date   ESOPHAGOGASTRODUODENOSCOPY (EGD) WITH PROPOFOL N/A 01/17/2019   Procedure: ESOPHAGOGASTRODUODENOSCOPY (EGD) WITH PROPOFOL;  Surgeon: Toledo, Boykin Nearing, MD;  Location: [facility] ENDOSCOPY;  Service: Gastroenterology;  Laterality: N/A;    Prior to Admission medications   Medication Sig  Start Date End Date Taking? Authorizing Provider  folic acid (FOLVITE) 1 MG tablet Take 1 tablet (1 mg total) by mouth daily. Patient not taking: Reported on 07/08/2019 02/17/19   Jimmye Norman, NP  furosemide (LASIX) 40 MG tablet Take 40 mg by mouth daily.  02/11/19 02/11/20  [provider]  lactulose (CHRONULAC) 10 GM/15ML solution Take 30 mLs (20 g total) by mouth 3 (three) times daily. 02/16/19   Jimmye Norman, NP  LORazepam (ATIVAN) 1 MG tablet Take 1 tablet (1 mg total) by mouth 2 (two) times daily. Take 1 mg of Ativan 2 pills 3 times a day for 2 days then decrease to 1 mg of Ativan 2 pills twice a day for 2 days then decrease to 1 mg of Ativan 1 pill twice a day for 2 days then stop 07/01/19 06/30/20  Arnaldo Natal, MD  Multiple Vitamin (MULTIVITAMIN WITH MINERALS) TABS tablet Take 1 tablet by mouth daily. Patient not taking: Reported on 07/08/2019 02/17/19   Jimmye Norman, NP  nicotine (NICODERM CQ - DOSED IN MG/24 HOURS) 21 mg/24hr patch Place 1 patch (21 mg total) onto the skin daily. 01/23/19   Adrian Saran, MD  pantoprazole (PROTONIX) 40 MG tablet Take 1 tablet (40 mg total) by mouth 2 (two) times daily before a meal. Patient not taking: Reported on 07/08/2019 01/23/19   Enid Baas, MD  polyethylene glycol (MIRALAX / GLYCOLAX) 17 g packet Take 17 g by mouth daily as needed for mild constipation. 02/16/19  Lang Snow, NP  propranolol (INDERAL) 20 MG tablet Take 20 mg by mouth 2 (two) times daily.    [provider]  spironolactone (ALDACTONE) 100 MG tablet Take 2 tablets (200 mg total) by mouth daily. Patient taking differently: Take 100 mg by mouth daily.  02/17/19   Lang Snow, NP  thiamine 100 MG tablet Take 1 tablet (100 mg total) by mouth daily. Patient not taking: Reported on 07/08/2019 02/17/19   Lang Snow, NP    Allergies Wool alcohol [lanolin]  Family History  Problem Relation Age of Onset     Heart disease Father     Social History Social History   Tobacco Use   Smoking status: Current Every Day Smoker    Packs/day: 0.50    Years: 20.00    Pack years: 10.00    Types: Cigarettes   Smokeless tobacco: Never Used  Substance Use Topics   Alcohol use: Yes    Comment: last drink 07/17/19   Drug use: Not Currently    Review of Systems Constitutional: No fever/chills Eyes: No visual changes. ENT: No sore throat.  Reports he had a small nosebleed earlier today. Cardiovascular: Denies chest pain. Respiratory: See HPI Gastrointestinal: See HPI .  Does report he threw up once today and seemed to have a slight amount of blood in it genitourinary: Negative for dysuria. Musculoskeletal: Negative for back pain. Skin: Negative for rash. Neurological: Negative for headaches, areas of focal weakness or numbness.    ____________________________________________   PHYSICAL EXAM:  VITAL SIGNS: ED Triage Vitals  Enc Vitals Group     BP 07/17/19 1050 (!) 152/95     Pulse Rate 07/17/19 1050 91     Resp 07/17/19 1050 14     Temp 07/17/19 1050 98.4 F (36.9 C)     Temp Source 07/17/19 1050 Oral     SpO2 07/17/19 1050 97 %     Weight 07/17/19 1051 199 lb 8 oz (90.5 kg)     Height 07/17/19 1051 5\' 6"  (1.676 m)     Head Circumference --      Peak Flow --      Pain Score 07/17/19 1050 10     Pain Loc --      Pain Edu? --      Excl. in [place]? --     Constitutional: Alert and oriented. Well appearing and in no acute distress.  Does appear generally ill, slightly jaundiced in appearance. Eyes: Conjunctivae are slightly jaundiced.  Reports a nosebleed earlier today. Head: Atraumatic. Nose: No congestion/rhinnorhea. Mouth/Throat: Mucous membranes are moist. Neck: No stridor.  Cardiovascular: Normal rate, regular rhythm. Grossly normal heart sounds.  Good peripheral circulation. Respiratory: Normal respiratory effort except for just mildly increased rate.  No retractions.  Lungs CTAB though diminished in the bilateral bases.  Speaks in phrases. Gastrointestinal: Moderately distended, a soft and reducible umbilical hernia is present.  Clinical examination appears consistent with ascites and liver disease.  Spider angiomata.  No rebound or guarding.  No noted peritonitis. Musculoskeletal: No lower extremity tenderness nor edema. Neurologic:  Normal speech and language. No gross focal neurologic deficits are appreciated.  Skin:  Skin is warm, dry and intact. No rash noted. Psychiatric: Mood and affect are normal. Speech and behavior are normal.  ____________________________________________   LABS (all labs ordered are listed, but only abnormal results are displayed)  Labs Reviewed  LIPASE, BLOOD - Abnormal; Notable for the following components:      Result  Value   Lipase 127 (*)    All other components within normal limits  COMPREHENSIVE METABOLIC PANEL - Abnormal; Notable for the following components:   Sodium 124 (*)    Chloride 93 (*)    CO2 21 (*)    Glucose, Bld 132 (*)    Calcium 8.3 (*)    Albumin 2.3 (*)    AST 113 (*)    Alkaline Phosphatase 170 (*)    Total Bilirubin 9.3 (*)    All other components within normal limits  CBC - Abnormal; Notable for the following components:   RBC 3.28 (*)    Hemoglobin 12.0 (*)    HCT 32.8 (*)    MCH 36.6 (*)    MCHC 36.6 (*)    RDW 17.0 (*)    Platelets 62 (*)    All other components within normal limits  URINALYSIS, COMPLETE (UACMP) WITH MICROSCOPIC - Abnormal; Notable for the following components:   Color, Urine AMBER (*)    APPearance HAZY (*)    Hgb urine dipstick SMALL (*)    All other components within normal limits  ETHANOL - Abnormal; Notable for the following components:   Alcohol, Ethyl (B) 99 (*)    All other components within normal limits  SARS CORONAVIRUS 2 (TAT 6-24 HRS)  URINE DRUG SCREEN, QUALITATIVE ([facility] ONLY)  MAGNESIUM  PHOSPHORUS  PROTIME-INR    ____________________________________________  EKG  Reviewed entered by me at 1050 Heart rate 99 QRS 100 QTc 460 Normal sinus rhythm, 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  No evidence acute ischemia ____________________________________________  RADIOLOGY  Dg Chest Portable 1 View  Result Date: 07/17/2019 CLINICAL DATA:  Dyspnea and swollen abdomen EXAM: PORTABLE CHEST 1 VIEW COMPARISON:  Chest radiograph dated 03/08/2019 FINDINGS: The heart remains enlarged. There is mild diffuse bilateral interstitial opacities, unchanged. There is no pleural effusion or pneumothorax. The osseous structures are intact. IMPRESSION: 1. Mild diffuse interstitial opacities are not significantly changed and may represent pulmonary edema or underlying chronic interstitial lung disease. 2. Unchanged cardiomegaly. Electronically Signed   By: Romona Curls M.D.   On: 07/17/2019 11:59     Chest x-ray reviewed, diffuse interstitial opacities not significantly changed ____________________________________________   PROCEDURES  Procedure(s) performed: None  Procedures  Critical Care performed: No  ____________________________________________   INITIAL IMPRESSION / ASSESSMENT AND PLAN / ED COURSE  Pertinent labs & imaging results that were available during my care of the patient were reviewed by me and considered in my medical decision making (see chart for details).   Differential diagnosis includes but is not limited to, abdominal perforation, aortic dissection, cholecystitis, appendicitis, diverticulitis, colitis, esophagitis/gastritis, kidney stone, pyelonephritis, urinary tract infection, aortic aneurysm. All are considered in decision and treatment plan. Based upon the patient's presentation and risk factors, I suspect his symptoms are likely due to recurrent or worsening ascites.  He has similar presentation in the past.  Denies fevers, examination with tense ascites and slight increased work of breathing  without acute respiratory distress or hypoxia at this time, but does appear that he is likely in need of a therapeutic paracentesis.  I do not see overt evidence by clinical exam or history to suggest SBP.   Clinical Course as of Jul 16 1410  Sat Jul 17, 2019  1403 Paged interventional radiologist on-call to discuss options for paracentesis   [MQ]    Clinical Course User Index [MQ] Sharyn Creamer, MD   ----------------------------------------- 2:14 PM on 07/17/2019 -----------------------------------------  Patient notified myself and  the nurse that he is ready to be discharged and that he does not wish to stay any longer today but will come back Monday.  I discussed with the patient recommend that he stay and allow us to do procedure to remove fluid from his abdomen as if this worsened acute cause trouble breathing, pain, there could be infection, or worsening of his condition.  Patient, however, does not wish to stay, he reports that he will have family pick him up and that he is unwilling to stay for a paracentesis.  I recommended that we could assist with him and meet any needs that he may have, but he reports he just needs to go and that he wants his family to pick him up.  He is however agreeable to returning if his symptoms worsen, he has a fever, begins vomiting blood, has bloody stools or other new or worsening symptoms.  Patient discharging AGAINST MEDICAL ADVICE which I made very clear to him and he is in agreement  ____________________________________________   FINAL CLINICAL IMPRESSION(S) / ED DIAGNOSES  Final diagnoses:  Ascites due to alcoholic cirrhosis [employer](HCC)        Note:  This document was prepared using Dragon voice recognition software and may include unintentional dictation errors       Sharyn CreamerQuale, Gurtaj Ruz, MD 07/17/19 1415

## 2019-07-17 NOTE — ED Triage Notes (Signed)
Pt states he drank beer 2 hours ago, requesting ativan for DT's. Pt states he drinks 6-8 beers a day. States SOB and abdominal swelling. abd appears distended and swollen. States a few weeks ago he had fluid drained off. Pt also has a hernia. A&O, on room air at 97%. No resp distress noted. Speaking in complete sentences.

## 2019-07-17 NOTE — Discharge Summary (Signed)
[place] at [place] NAME: Frederick Cabrera    MR#:  [id number]  DATE OF BIRTH:  [birth year]  DATE OF ADMISSION:  07/08/2019   ADMITTING PHYSICIAN: Otila Back, MD  DATE OF DISCHARGE: 07/09/2019 12:57 PM  PRIMARY CARE PHYSICIAN: [place]   ADMISSION DIAGNOSIS:  Hyponatremia [E87.1] Ascites [R18.8] Decompensated hepatic cirrhosis ([place]) [K72.90, K74.60] DISCHARGE DIAGNOSIS:  Active Problems:   Ascites  SECONDARY DIAGNOSIS:   Past Medical History:  Diagnosis Date  . Alcoholic cirrhosis of liver with ascites [facility])    HOSPITAL COURSE:    Patient left AGAINST MEDICAL ADVICE   Chief complaint; abdominal pain  History of presenting complaint; Frederick Cabrera  is a 53 y.o. male with a known history of alcoholic liver cirrhosis with ascites with recurrent paracentesis, hypertension, tobacco abuse and chronic alcoholism who presented to the emergency room with complaints of abdominal pain and increasing abdominal distention.  No fevers.  No nausea vomiting.  No GI bleed.  Patient was evaluated in the emergency room and said to have some mild tremors.  No seizure.  Patient's last drink was yesterday.  Given degree of ascites on clinical exam emergency room provider requested for ultrasound-guided paracentesis which was already done prior to my evaluation with significant improvement in abdominal distention.  Patient did have abdominal pain at time of admission. Laboratory studies done revealed sodium of 122.  Medical service called to admit patient.  Hospital course; 1.  Large volume ascites Patient presented with abdominal pain and distention. Paracentesis already done with ultrasound guidance prior to my evaluation with significant improvement in abdominal distention and pain. Continue home dose of Lasix and spironolactone.  2.  Hyponatremia with sodium of 122 Likely due to continued alcohol use. Requested for hyponatremia  work-up with plans to follow-up on repeat sodium levels. Later notified that patient signed out and left [place].  3.  Chronic alcoholism Patient still drinks at least 6 packs of beer every day.  Last drink was yesterday. Trying to cut down. Having some tremors already.   Placed on CIWA protocol.    Placed on thiamine, multivitamin and folic acid.  Patient left AGAINST MEDICAL ADVICE. 4.  Thrombocytopenia with platelet count of 46,000. Secondary to continued alcohol abuse and liver cirrhosis No evidence of bleeding.  Monitor 5.  Tobacco abuse Patient smokes about half a pack of cigarettes per day.  Smoking cessation counseling done.  Patient motivated to cut down. Placed on nicotine patch  DISCHARGE CONDITIONS:  Patient left AGAINST MEDICAL ADVICE CONSULTS OBTAINED:   DRUG ALLERGIES:   Allergies  Allergen Reactions  . Wool Alcohol [Lanolin] Rash     DISCHARGE INSTRUCTIONS:   DIET:  Patient left AGAINST MEDICAL ADVICE DISCHARGE CONDITION:  Fair ACTIVITY:  Patient left AGAINST MEDICAL ADVICE OXYGEN:  Home Oxygen: No.  Oxygen Delivery: room air DISCHARGE LOCATION:  home   If you experience worsening of your admission symptoms, develop shortness of breath, life threatening emergency, suicidal or homicidal thoughts you must seek medical attention immediately by calling 911 or calling your MD immediately  if symptoms less severe.  You Must read complete instructions/literature along with all the possible adverse reactions/side effects for all the Medicines you take and that have been prescribed to you. Take any new Medicines after you have completely understood and accpet all the possible adverse reactions/side effects.   TOTAL TIME TAKING CARE OF THIS PATIENT: 36 minutes.    Dennies Coate M.D on  07/17/2019 at 3:48 PM  Between 7am to 6pm - Pager - [phone]  After 6pm go to [email or web address] - Social research officer, government  [facility]   Office  [phone]  CC: Primary care physician; [facility]   Note: This dictation was prepared with Dragon dictation along with smaller phrase technology. Any transcriptional errors that result from this process are unintentional.

## 2019-07-20 ENCOUNTER — Inpatient Hospital Stay
Admission: EM | Admit: 2019-07-20 | Discharge: 2019-07-21 | DRG: 432 | Disposition: A | Discharge status: Home / Self Care | Payer: Medicaid Other | Attending: Internal Medicine | Admitting: Internal Medicine

## 2019-07-20 ENCOUNTER — Emergency Department: Payer: Medicaid Other

## 2019-07-20 ENCOUNTER — Inpatient Hospital Stay: Payer: Medicaid Other

## 2019-07-20 ENCOUNTER — Other Ambulatory Visit: Payer: Self-pay

## 2019-07-20 DIAGNOSIS — Z79899 Other long term (current) drug therapy: Secondary | ICD-10-CM

## 2019-07-20 DIAGNOSIS — F17210 Nicotine dependence, cigarettes, uncomplicated: Secondary | ICD-10-CM | POA: Diagnosis present

## 2019-07-20 DIAGNOSIS — S01111A Laceration without foreign body of right eyelid and periocular area, initial encounter: Secondary | ICD-10-CM | POA: Diagnosis present

## 2019-07-20 DIAGNOSIS — R0602 Shortness of breath: Secondary | ICD-10-CM

## 2019-07-20 DIAGNOSIS — Z20828 Contact with and (suspected) exposure to other viral communicable diseases: Secondary | ICD-10-CM | POA: Diagnosis present

## 2019-07-20 DIAGNOSIS — K7011 Alcoholic hepatitis with ascites: Secondary | ICD-10-CM | POA: Diagnosis present

## 2019-07-20 DIAGNOSIS — K852 Alcohol induced acute pancreatitis without necrosis or infection: Secondary | ICD-10-CM | POA: Diagnosis present

## 2019-07-20 DIAGNOSIS — D61818 Other pancytopenia: Secondary | ICD-10-CM | POA: Diagnosis present

## 2019-07-20 DIAGNOSIS — F1020 Alcohol dependence, uncomplicated: Secondary | ICD-10-CM | POA: Diagnosis present

## 2019-07-20 DIAGNOSIS — Z8249 Family history of ischemic heart disease and other diseases of the circulatory system: Secondary | ICD-10-CM | POA: Diagnosis not present

## 2019-07-20 DIAGNOSIS — W19XXXA Unspecified fall, initial encounter: Secondary | ICD-10-CM | POA: Diagnosis present

## 2019-07-20 DIAGNOSIS — Z888 Allergy status to other drugs, medicaments and biological substances status: Secondary | ICD-10-CM | POA: Diagnosis not present

## 2019-07-20 DIAGNOSIS — K859 Acute pancreatitis without necrosis or infection, unspecified: Secondary | ICD-10-CM

## 2019-07-20 DIAGNOSIS — E871 Hypo-osmolality and hyponatremia: Principal | ICD-10-CM

## 2019-07-20 DIAGNOSIS — K7031 Alcoholic cirrhosis of liver with ascites: Secondary | ICD-10-CM | POA: Diagnosis present

## 2019-07-20 LAB — URINALYSIS, COMPLETE (UACMP) WITH MICROSCOPIC
Bacteria, UA: NONE SEEN
Glucose, UA: NEGATIVE mg/dL
Hgb urine dipstick: NEGATIVE
Ketones, ur: NEGATIVE mg/dL
Leukocytes,Ua: NEGATIVE
Nitrite: NEGATIVE
Protein, ur: 30 mg/dL — AB
Specific Gravity, Urine: 1.027 (ref 1.005–1.030)
pH: 5 (ref 5.0–8.0)

## 2019-07-20 LAB — CBC WITH DIFFERENTIAL/PLATELET
Abs Immature Granulocytes: 0.13 10*3/uL — ABNORMAL HIGH (ref 0.00–0.07)
Basophils Absolute: 0.1 10*3/uL (ref 0.0–0.1)
Basophils Relative: 1 %
Eosinophils Absolute: 0.1 10*3/uL (ref 0.0–0.5)
Eosinophils Relative: 1 %
HCT: 30.4 % — ABNORMAL LOW (ref 39.0–52.0)
Hemoglobin: 11.2 g/dL — ABNORMAL LOW (ref 13.0–17.0)
Immature Granulocytes: 1 %
Lymphocytes Relative: 15 %
Lymphs Abs: 1.5 10*3/uL (ref 0.7–4.0)
MCH: 37 pg — ABNORMAL HIGH (ref 26.0–34.0)
MCHC: 36.8 g/dL — ABNORMAL HIGH (ref 30.0–36.0)
MCV: 100.3 fL — ABNORMAL HIGH (ref 80.0–100.0)
Monocytes Absolute: 1.2 10*3/uL — ABNORMAL HIGH (ref 0.1–1.0)
Monocytes Relative: 13 %
Neutro Abs: 6.5 10*3/uL (ref 1.7–7.7)
Neutrophils Relative %: 69 %
Platelets: 69 10*3/uL — ABNORMAL LOW (ref 150–400)
RBC: 3.03 MIL/uL — ABNORMAL LOW (ref 4.22–5.81)
RDW: 16.5 % — ABNORMAL HIGH (ref 11.5–15.5)
Smear Review: DECREASED
WBC: 9.5 10*3/uL (ref 4.0–10.5)
nRBC: 0 % (ref 0.0–0.2)

## 2019-07-20 LAB — COMPREHENSIVE METABOLIC PANEL
ALT: 39 U/L (ref 0–44)
AST: 98 U/L — ABNORMAL HIGH (ref 15–41)
Albumin: 2.1 g/dL — ABNORMAL LOW (ref 3.5–5.0)
Alkaline Phosphatase: 211 U/L — ABNORMAL HIGH (ref 38–126)
Anion gap: 9 (ref 5–15)
BUN: 12 mg/dL (ref 6–20)
CO2: 20 mmol/L — ABNORMAL LOW (ref 22–32)
Calcium: 8 mg/dL — ABNORMAL LOW (ref 8.9–10.3)
Chloride: 94 mmol/L — ABNORMAL LOW (ref 98–111)
Creatinine, Ser: 0.73 mg/dL (ref 0.61–1.24)
GFR calc Af Amer: >60 mL/min (ref >60)
GFR calc non Af Amer: >60 mL/min (ref >60)
Glucose, Bld: 157 mg/dL — ABNORMAL HIGH (ref 70–99)
Potassium: 4.1 mmol/L (ref 3.5–5.1)
Sodium: 123 mmol/L — ABNORMAL LOW (ref 135–145)
Total Bilirubin: 6.3 mg/dL — ABNORMAL HIGH (ref 0.3–1.2)
Total Protein: 7.7 g/dL (ref 6.5–8.1)

## 2019-07-20 LAB — TYPE AND SCREEN
ABO/RH(D): A POS
Antibody Screen: NEGATIVE

## 2019-07-20 LAB — LIPASE, BLOOD: Lipase: 136 U/L — ABNORMAL HIGH (ref 11–51)

## 2019-07-20 MED ORDER — ONDANSETRON HCL 4 MG/2ML IJ SOLN
4.0000 mg | Freq: Once | INTRAMUSCULAR | Status: AC
  Administered 2019-07-20: 4 mg via INTRAVENOUS
  Filled 2019-07-20: qty 2

## 2019-07-20 MED ORDER — MORPHINE SULFATE (PF) 4 MG/ML IV SOLN
4.0000 mg | Freq: Once | INTRAVENOUS | Status: AC
  Administered 2019-07-20: 4 mg via INTRAVENOUS
  Filled 2019-07-20: qty 1

## 2019-07-20 NOTE — ED Triage Notes (Signed)
Pt comes EMS after having 2 beers. Pt was seen here last week after needing to be admitted for a tap to drain his stomach but pt refused to be admitted. Pt has cirrhosis. Pt was vomiting blood this am. Pt constipated-no BM x3 days. Pt stomach more distended than normal.

## 2019-07-20 NOTE — ED Notes (Addendum)
Patient transported to US 

## 2019-07-20 NOTE — H&P (Signed)
[place] at [place] NAME: Frederick Cabrera    MR#:  [id number]  DATE OF BIRTH:  07-16-1966  DATE OF ADMISSION:  07/20/2019  PRIMARY CARE PHYSICIAN: [place]   REQUESTING/REFERRING PHYSICIAN: Cinda Quest, MD  CHIEF COMPLAINT:   Chief Complaint  Patient presents with  . Shortness of Breath    HISTORY OF PRESENT ILLNESS:  Frederick Cabrera  is a 53 y.o. male who presents with chief complaint as above.  Patient presents the ED with a complaint of shortness of breath.  He states that shortness of breath is due to his large volume ascites.  He was supposed to have a tap done recently, but that did not happen.  He is also found on evaluation here tonight to have some pancreatitis, as well as known chronic hyponatremia.  He did state that he has been drinking some lately.  Hospitalist were called for admission  PAST MEDICAL HISTORY:   Past Medical History:  Diagnosis Date  . Alcoholic cirrhosis of liver with ascites ([place])      PAST SURGICAL HISTORY:   Past Surgical History:  Procedure Laterality Date  . ESOPHAGOGASTRODUODENOSCOPY (EGD) WITH PROPOFOL N/A 01/17/2019   Procedure: ESOPHAGOGASTRODUODENOSCOPY (EGD) WITH PROPOFOL;  Surgeon: Toledo, Benay Pike, MD;  Location: [facility] ENDOSCOPY;  Service: Gastroenterology;  Laterality: N/A;     SOCIAL HISTORY:   Social History   Tobacco Use  . Smoking status: Current Every Day Smoker    Packs/day: 0.50    Years: 20.00    Pack years: 10.00    Types: Cigarettes  . Smokeless tobacco: Never Used  Substance Use Topics  . Alcohol use: Yes    Comment: last drink 07/17/19     FAMILY HISTORY:   Family History  Problem Relation Age of Onset  . Heart disease Father      DRUG ALLERGIES:   Allergies  Allergen Reactions  . Wool Alcohol [Lanolin] Rash    MEDICATIONS AT HOME:   Prior to Admission medications   Medication Sig Start Date End Date Taking? Authorizing Provider   folic acid (FOLVITE) 1 MG tablet Take 1 tablet (1 mg total) by mouth daily. Patient not taking: Reported on 07/08/2019 02/17/19   Lang Snow, NP  furosemide (LASIX) 40 MG tablet Take 40 mg by mouth daily.  02/11/19 02/11/20  [provider]  lactulose (CHRONULAC) 10 GM/15ML solution Take 30 mLs (20 g total) by mouth 3 (three) times daily. 02/16/19   Lang Snow, NP  LORazepam (ATIVAN) 1 MG tablet Take 1 tablet (1 mg total) by mouth 2 (two) times daily. Take 1 mg of Ativan 2 pills 3 times a day for 2 days then decrease to 1 mg of Ativan 2 pills twice a day for 2 days then decrease to 1 mg of Ativan 1 pill twice a day for 2 days then stop 07/01/19 06/30/20  Nena Polio, MD  Multiple Vitamin (MULTIVITAMIN WITH MINERALS) TABS tablet Take 1 tablet by mouth daily. Patient not taking: Reported on 07/08/2019 02/17/19   Lang Snow, NP  nicotine (NICODERM CQ - DOSED IN MG/24 HOURS) 21 mg/24hr patch Place 1 patch (21 mg total) onto the skin daily. 01/23/19   Bettey Costa, MD  pantoprazole (PROTONIX) 40 MG tablet Take 1 tablet (40 mg total) by mouth 2 (two) times daily before a meal. Patient not taking: Reported on 07/08/2019 01/23/19   Gladstone Lighter, MD  polyethylene glycol (MIRALAX / GLYCOLAX) 17 g  packet Take 17 g by mouth daily as needed for mild constipation. 02/16/19   Jimmye Normanuma, Elizabeth Achieng, NP  propranolol (INDERAL) 20 MG tablet Take 20 mg by mouth 2 (two) times daily.    [provider]  spironolactone (ALDACTONE) 100 MG tablet Take 2 tablets (200 mg total) by mouth daily. Patient taking differently: Take 100 mg by mouth daily.  02/17/19   Jimmye Normanuma, Elizabeth Achieng, NP  thiamine 100 MG tablet Take 1 tablet (100 mg total) by mouth daily. Patient not taking: Reported on 07/08/2019 02/17/19   Jimmye Normanuma, Elizabeth Achieng, NP    REVIEW OF SYSTEMS:  Review of Systems  Constitutional: Negative for chills, fever, malaise/fatigue and weight loss.  HENT:  Negative for ear pain, hearing loss and tinnitus.   Eyes: Negative for blurred vision, double vision, pain and redness.  Respiratory: Positive for shortness of breath. Negative for cough and hemoptysis.   Cardiovascular: Negative for chest pain, palpitations, orthopnea and leg swelling.  Gastrointestinal: Positive for abdominal pain. Negative for constipation, diarrhea, nausea and vomiting.  Genitourinary: Negative for dysuria, frequency and hematuria.  Musculoskeletal: Negative for back pain, joint pain and neck pain.  Skin:       No acne, rash, or lesions  Neurological: Negative for dizziness, tremors, focal weakness and weakness.  Endo/Heme/Allergies: Negative for polydipsia. Does not bruise/bleed easily.  Psychiatric/Behavioral: Negative for depression. The patient is not nervous/anxious and does not have insomnia.      VITAL SIGNS:   Vitals:   07/20/19 2034 07/20/19 2035 07/20/19 2041 07/20/19 2044  BP:    131/78  Pulse:  (!) 109  (!) 102  Resp:  18    Temp:   98.5 F (36.9 C)   TempSrc:   Oral   SpO2:  97%  96%  Weight: 81.6 kg     Height: 5\' 8"  (1.727 m)      Wt Readings from Last 3 Encounters:  07/20/19 81.6 kg  07/17/19 90.5 kg  07/08/19 89.4 kg    PHYSICAL EXAMINATION:  Physical Exam  Vitals reviewed. Constitutional: He is oriented to person, place, and time. He appears well-developed and well-nourished. No distress.  HENT:  Head: Normocephalic and atraumatic.  Mouth/Throat: Oropharynx is clear and moist.  Eyes: Pupils are equal, round, and reactive to light. Conjunctivae and EOM are normal. No scleral icterus.  Neck: Normal range of motion. Neck supple. No JVD present. No thyromegaly present.  Cardiovascular: Regular rhythm and intact distal pulses. Exam reveals no gallop and no friction rub.  No murmur heard. Tachycardic  Respiratory: Effort normal and breath sounds normal. No respiratory distress. He has no wheezes. He has no rales.  GI: Soft. Bowel sounds  are normal. He exhibits distension. There is abdominal tenderness.  Musculoskeletal: Normal range of motion.        General: No edema.     Comments: No arthritis, no gout  Lymphadenopathy:    He has no cervical adenopathy.  Neurological: He is alert and oriented to person, place, and time. No cranial nerve deficit.  No dysarthria, no aphasia  Skin: Skin is warm and dry. No rash noted. No erythema.  Psychiatric: He has a normal mood and affect. His behavior is normal. Judgment and thought content normal.    LABORATORY PANEL:   CBC Recent Labs  Lab 07/20/19 2047  WBC 9.5  HGB 11.2*  HCT 30.4*  PLT 69*   ------------------------------------------------------------------------------------------------------------------  Chemistries  Recent Labs  Lab 07/20/19 2047  NA 123*  K 4.1  CL 94*  CO2 20*  GLUCOSE 157*  BUN 12  CREATININE 0.73  CALCIUM 8.0*  AST 98*  ALT 39  ALKPHOS 211*  BILITOT 6.3*   ------------------------------------------------------------------------------------------------------------------  Cardiac Enzymes No results for input(s): TROPONINI in the last 168 hours. ------------------------------------------------------------------------------------------------------------------  RADIOLOGY:  Dg Chest Portable 1 View  Result Date: 07/20/2019 CLINICAL DATA:  Shortness of breath EXAM: PORTABLE CHEST 1 VIEW COMPARISON:  July 17, 2019 FINDINGS: The mediastinal contour is normal. Heart size is enlarged. There is pulmonary edema. There is no pleural effusion or focal pneumonia. The bony structures are stable. IMPRESSION: Cardiomegaly and pulmonary edema. Electronically Signed   By: Sherian Rein M.D.   On: 07/20/2019 21:41    EKG:   Orders placed or performed during the hospital encounter of 07/20/19  . EKG 12-Lead  . EKG 12-Lead  . ED EKG  . ED EKG    IMPRESSION AND PLAN:  Principal Problem:   Alcoholic cirrhosis of liver with ascites (HCC)  -ultrasound paracentesis ordered, continue other home meds Active Problems:   Alcoholic pancreatitis -n.p.o. for now, will not order IV fluids given his difficulty with his volume status and his large volume ascites, as needed analgesia and antiemetics   Hyponatremia -this is chronic.  In the past he has had levels up to the high 120s to low 130s.  However, recently he is in the low 120s, which is where he is tonight.  He likely has reset osmostat, even though his current values may potentially be a little lower than what his baseline is.  We will continue to monitor for improvement with treatment of the above problems.  Chart review performed and case discussed with ED provider. Labs, imaging and/or ECG reviewed by provider and discussed with patient/family. Management plans discussed with the patient and/or family.  COVID-19 status: Pending  DVT PROPHYLAXIS: Mechanical only  GI PROPHYLAXIS:  None  ADMISSION STATUS: Inpatient     CODE STATUS: Full Code Status History    Date Active Date Inactive Code Status Order ID Comments User Context   07/08/2019 1716 07/09/2019 1557 Full Code [id number]  Jama Flavors, MD ED   03/08/2019 0923 03/09/2019 1743 Full Code [id number]  Arnaldo Natal, MD Inpatient   02/13/2019 2039 02/16/2019 1953 Full Code [id number]  Pearletha Alfred, NP ED   02/06/2019 0031 02/06/2019 1835 Full Code [id number]  Oralia Manis, MD Inpatient   01/17/2019 0930 01/23/2019 1905 Full Code [id number]  Ramonita Lab, MD ED   01/17/2019 0810 01/17/2019 0930 Full Code [id number]  Nita Sickle, MD ED   Advance Care Planning Activity      TOTAL TIME TAKING CARE OF THIS PATIENT: 45 minutes.   This patient was evaluated in the context of the global COVID-19 pandemic, which necessitated consideration that the patient might be at risk for infection with the SARS-CoV-2 virus that causes COVID-19. Institutional protocols and algorithms that pertain to the evaluation of patients at risk for  COVID-19 are in a state of rapid change based on information released by regulatory bodies including the CDC and federal and state organizations. These policies and algorithms were followed to the best of this provider's knowledge to date during the patient's care at this facility.  Barney Drain 07/20/2019, 10:28 PM  [facility]  Office  [phone]  CC: Primary care physician; [facility]  Note:  This document was prepared using Dragon voice recognition software and may include unintentional dictation errors.

## 2019-07-20 NOTE — ED Notes (Signed)
Cup of ice chips given to pt.

## 2019-07-20 NOTE — ED Provider Notes (Addendum)
[facility] Emergency Department Provider Note   ____________________________________________   First MD Initiated Contact with Patient 07/20/19 2045     (approximate)  I have reviewed the triage vital signs and the nursing notes.   HISTORY  Chief Complaint Shortness of Breath    HPI Frederick Cabrera is a 53 y.o. male who has cirrhosis and ascites.  He was supposed to get a belly tap done a few days ago but signed out AMA.  He comes back today after having had 2 beers and vomiting of some streaks of blood.  His belly is bigger than usual and very tense he says is making short of breath.  Additionally his legs are swollen.  He is very uncomfortable.  He denies any fever.  He reports that about a day ago he stood up and got dizzy because of the pain in his belly from the swelling and fell and hit his head on the coffee table.  He has a healing laceration below the right eye next to the nose.         Past Medical History:  Diagnosis Date  . Alcoholic cirrhosis of liver with ascites [facility](HCC)     Patient Active Problem List   Diagnosis Date Noted  . Ascites 07/08/2019  . Hypoxia 03/08/2019  . Respiratory distress 03/08/2019  . Abdominal pain 02/14/2019  . Ascites due to alcoholic cirrhosis (HCC) 02/13/2019  . Alcoholic cirrhosis of liver with ascites (HCC)   . Goals of care, counseling/discussion   . Palliative care by specialist   . Hematemesis 01/17/2019    Past Surgical History:  Procedure Laterality Date  . ESOPHAGOGASTRODUODENOSCOPY (EGD) WITH PROPOFOL N/A 01/17/2019   Procedure: ESOPHAGOGASTRODUODENOSCOPY (EGD) WITH PROPOFOL;  Surgeon: Toledo, Boykin Nearingeodoro K, MD;  Location: [facility] ENDOSCOPY;  Service: Gastroenterology;  Laterality: N/A;    Prior to Admission medications   Medication Sig Start Date End Date Taking? Authorizing Provider  folic acid (FOLVITE) 1 MG tablet Take 1 tablet (1 mg total) by mouth daily. Patient not taking: Reported on  07/08/2019 02/17/19   Jimmye Normanuma, Elizabeth Achieng, NP  furosemide (LASIX) 40 MG tablet Take 40 mg by mouth daily.  02/11/19 02/11/20  [provider]  lactulose (CHRONULAC) 10 GM/15ML solution Take 30 mLs (20 g total) by mouth 3 (three) times daily. 02/16/19   Jimmye Normanuma, Elizabeth Achieng, NP  LORazepam (ATIVAN) 1 MG tablet Take 1 tablet (1 mg total) by mouth 2 (two) times daily. Take 1 mg of Ativan 2 pills 3 times a day for 2 days then decrease to 1 mg of Ativan 2 pills twice a day for 2 days then decrease to 1 mg of Ativan 1 pill twice a day for 2 days then stop 07/01/19 06/30/20  Arnaldo NatalMalinda, Mercede Rollo F, MD  Multiple Vitamin (MULTIVITAMIN WITH MINERALS) TABS tablet Take 1 tablet by mouth daily. Patient not taking: Reported on 07/08/2019 02/17/19   Jimmye Normanuma, Elizabeth Achieng, NP  nicotine (NICODERM CQ - DOSED IN MG/24 HOURS) 21 mg/24hr patch Place 1 patch (21 mg total) onto the skin daily. 01/23/19   Adrian SaranMody, Sital, MD  pantoprazole (PROTONIX) 40 MG tablet Take 1 tablet (40 mg total) by mouth 2 (two) times daily before a meal. Patient not taking: Reported on 07/08/2019 01/23/19   Enid BaasKalisetti, Radhika, MD  polyethylene glycol (MIRALAX / GLYCOLAX) 17 g packet Take 17 g by mouth daily as needed for mild constipation. 02/16/19   Jimmye Normanuma, Elizabeth Achieng, NP  propranolol (INDERAL) 20 MG tablet Take 20 mg by  mouth 2 (two) times daily.    [provider]  spironolactone (ALDACTONE) 100 MG tablet Take 2 tablets (200 mg total) by mouth daily. Patient taking differently: Take 100 mg by mouth daily.  02/17/19   Jimmye Norman, NP  thiamine 100 MG tablet Take 1 tablet (100 mg total) by mouth daily. Patient not taking: Reported on 07/08/2019 02/17/19   Jimmye Norman, NP    Allergies Wool alcohol [lanolin]  Family History  Problem Relation Age of Onset  . Heart disease Father     Social History Social History   Tobacco Use  . Smoking status: Current Every Day Smoker    Packs/day: 0.50    Years: 20.00     Pack years: 10.00    Types: Cigarettes  . Smokeless tobacco: Never Used  Substance Use Topics  . Alcohol use: Yes    Comment: last drink 07/17/19  . Drug use: Not Currently    Review of Systems  Constitutional: No fever/chills Eyes: No visual changes. ENT: No sore throat. Cardiovascular: Denies chest pain. Respiratory:  shortness of breath. Gastrointestinal:  abdominal pain.  No nausea, no vomiting.  No diarrhea.  No constipation. Genitourinary: Negative for dysuria. Musculoskeletal: Negative for back pain. Skin: Negative for rash. Neurological: Negative for headaches, focal weakness   ____________________________________________   PHYSICAL EXAM:  VITAL SIGNS: ED Triage Vitals  Enc Vitals Group     BP 07/20/19 2044 131/78     Pulse Rate 07/20/19 2035 (!) 109     Resp 07/20/19 2035 18     Temp 07/20/19 2041 98.5 F (36.9 C)     Temp Source 07/20/19 2041 Oral     SpO2 07/20/19 2035 97 %     Weight 07/20/19 2034 180 lb (81.6 kg)     Height 07/20/19 2034 5\' 8"  (1.727 m)     Head Circumference --      Peak Flow --      Pain Score 07/20/19 2033 9     Pain Loc --      Pain Edu? --      Excl. in GC? --     Constitutional: Alert and oriented.  Chronically ill and somewhat short of breath Eyes: Conjunctivae are jaundiced Head: Atraumatic except for healing laceration described in HPI. Nose: No congestion/rhinnorhea. Mouth/Throat: Mucous membranes are moist.  Oropharynx non-erythematous. Neck: No stridor.   Cardiovascular: Normal rate, regular rhythm. Grossly normal heart sounds.  Good peripheral circulation. Respiratory: Normal respiratory effort.  No retractions. Lungs CTAB. Gastrointestinal:distention firm diffusely mildly tender. No abdominal bruits.  }Musculoskeletal: No lower extremity tenderness 2+ bilateral edema.  . Neurologic:  Normal speech and language. No gross focal neurologic deficits are appreciated Skin:  Skin is warm, dry and intact dark in color    ____________________________________________   LABS (all labs ordered are listed, but only abnormal results are displayed)  Labs Reviewed  COMPREHENSIVE METABOLIC PANEL - Abnormal; Notable for the following components:      Result Value   Sodium 123 (*)    Chloride 94 (*)    CO2 20 (*)    Glucose, Bld 157 (*)    Calcium 8.0 (*)    Albumin 2.1 (*)    AST 98 (*)    Alkaline Phosphatase 211 (*)    Total Bilirubin 6.3 (*)    All other components within normal limits  LIPASE, BLOOD - Abnormal; Notable for the following components:   Lipase 136 (*)    All other components  within normal limits  CBC WITH DIFFERENTIAL/PLATELET  URINALYSIS, COMPLETE (UACMP) WITH MICROSCOPIC  APTT  PROTIME-INR  TYPE AND SCREEN   ____________________________________________  EKG  EKG read interpreted by me shows normal sinus rhythm rate of 100 left axis computer is reading left anterior hemiblock.  There is very poor R wave progression.  No acute ST-T changes.  Computer also says there is ST elevation inferiorly I do not see that. ____________________________________________  RADIOLOGY  ED MD interpretation:   Official radiology report(s): No results found.  ____________________________________________   PROCEDURES  Procedure(s) performed (including Critical Care):  Procedures   ____________________________________________   INITIAL IMPRESSION / ASSESSMENT AND PLAN / ED COURSE  Patient with abdominal distention needs his belly tapped again.  He said he is only vomiting streaks of blood.  Unlikely that this would represent a bleed from varicosities.  He does have an elevated lipase.  I will give him some pain meds will ultrasound him to see what his pancreas looks like.  We will need to admit him anyway because his sodium was 123.  Additionally he needs his belly tapped again.  Coags are pending.      Frederick Cabrera was evaluated in Emergency Department on 07/20/2019 for the symptoms  described in the history of present illness. He was evaluated in the context of the global COVID-19 pandemic, which necessitated consideration that the patient might be at risk for infection with the SARS-CoV-2 virus that causes COVID-19. Institutional protocols and algorithms that pertain to the evaluation of patients at risk for COVID-19 are in a state of rapid change based on information released by regulatory bodies including the CDC and federal and state organizations. These policies and algorithms were followed during the patient's care in the ED.       ____________________________________________   FINAL CLINICAL IMPRESSION(S) / ED DIAGNOSES  Final diagnoses:  Shortness of breath  Hyponatremia  Ascites due to alcoholic hepatitis  Acute pancreatitis, unspecified complication status, unspecified pancreatitis type     ED Discharge Orders    None       Note:  This document was prepared using Dragon voice recognition software and may include unintentional dictation errors.    Nena Polio, MD 07/20/19 2136    Nena Polio, MD 08/01/19 [id number]

## 2019-07-21 ENCOUNTER — Inpatient Hospital Stay: Payer: Medicaid Other

## 2019-07-21 LAB — BASIC METABOLIC PANEL
Anion gap: 7 (ref 5–15)
BUN: 11 mg/dL (ref 6–20)
CO2: 21 mmol/L — ABNORMAL LOW (ref 22–32)
Calcium: 8.1 mg/dL — ABNORMAL LOW (ref 8.9–10.3)
Chloride: 96 mmol/L — ABNORMAL LOW (ref 98–111)
Creatinine, Ser: 0.68 mg/dL (ref 0.61–1.24)
GFR calc Af Amer: >60 mL/min (ref >60)
GFR calc non Af Amer: >60 mL/min (ref >60)
Glucose, Bld: 113 mg/dL — ABNORMAL HIGH (ref 70–99)
Potassium: 4.2 mmol/L (ref 3.5–5.1)
Sodium: 124 mmol/L — ABNORMAL LOW (ref 135–145)

## 2019-07-21 LAB — BODY FLUID CELL COUNT WITH DIFFERENTIAL
Eos, Fluid: 0 %
Lymphs, Fluid: 29 %
Monocyte-Macrophage-Serous Fluid: 63 %
Neutrophil Count, Fluid: 8 %
Total Nucleated Cell Count, Fluid: 100 cu mm

## 2019-07-21 LAB — PROTIME-INR
INR: 1.4 — ABNORMAL HIGH (ref 0.8–1.2)
Prothrombin Time: 17.2 seconds — ABNORMAL HIGH (ref 11.4–15.2)

## 2019-07-21 LAB — CBC
HCT: 29.3 % — ABNORMAL LOW (ref 39.0–52.0)
Hemoglobin: 10.9 g/dL — ABNORMAL LOW (ref 13.0–17.0)
MCH: 36.6 pg — ABNORMAL HIGH (ref 26.0–34.0)
MCHC: 37.2 g/dL — ABNORMAL HIGH (ref 30.0–36.0)
MCV: 98.3 fL (ref 80.0–100.0)
Platelets: 61 10*3/uL — ABNORMAL LOW (ref 150–400)
RBC: 2.98 MIL/uL — ABNORMAL LOW (ref 4.22–5.81)
RDW: 16.4 % — ABNORMAL HIGH (ref 11.5–15.5)
WBC: 8.8 10*3/uL (ref 4.0–10.5)
nRBC: 0 % (ref 0.0–0.2)

## 2019-07-21 LAB — APTT: aPTT: 46 seconds — ABNORMAL HIGH (ref 24–36)

## 2019-07-21 LAB — PATHOLOGIST SMEAR REVIEW

## 2019-07-21 LAB — SARS CORONAVIRUS 2 (TAT 6-24 HRS): SARS Coronavirus 2: NEGATIVE

## 2019-07-21 MED ORDER — LORAZEPAM 2 MG/ML IJ SOLN: 1.0000 mg | INTRAMUSCULAR | Status: DC | PRN

## 2019-07-21 MED ORDER — FOLIC ACID 1 MG PO TABS
1.0000 mg | ORAL_TABLET | Freq: Every day | ORAL | Status: DC
  Administered 2019-07-21: 13:00:00 1 mg via ORAL
  Filled 2019-07-21: qty 1

## 2019-07-21 MED ORDER — PANTOPRAZOLE SODIUM 40 MG PO TBEC: 40.0000 mg | DELAYED_RELEASE_TABLET | Freq: Two times a day (BID) | ORAL | 2 refills | Status: DC

## 2019-07-21 MED ORDER — PROPRANOLOL HCL 20 MG PO TABS
20.0000 mg | ORAL_TABLET | Freq: Two times a day (BID) | ORAL | Status: DC
  Administered 2019-07-21: 20 mg via ORAL
  Filled 2019-07-21 (×3): qty 1

## 2019-07-21 MED ORDER — VITAMIN B-1 100 MG PO TABS
100.0000 mg | ORAL_TABLET | Freq: Every day | ORAL | Status: DC
  Administered 2019-07-21: 100 mg via ORAL
  Filled 2019-07-21: qty 1

## 2019-07-21 MED ORDER — MORPHINE SULFATE (PF) 2 MG/ML IV SOLN: 2.0000 mg | INTRAVENOUS | Status: DC | PRN

## 2019-07-21 MED ORDER — PROPRANOLOL HCL 20 MG PO TABS: 20.0000 mg | ORAL_TABLET | Freq: Two times a day (BID) | ORAL | 1 refills | Status: DC

## 2019-07-21 MED ORDER — ONDANSETRON HCL 4 MG PO TABS: 4.0000 mg | ORAL_TABLET | Freq: Four times a day (QID) | ORAL | Status: DC | PRN

## 2019-07-21 MED ORDER — ADULT MULTIVITAMIN W/MINERALS CH: 1.0000 | ORAL_TABLET | Freq: Every day | ORAL | 1 refills | Status: DC

## 2019-07-21 MED ORDER — OXYCODONE HCL 5 MG PO TABS
5.0000 mg | ORAL_TABLET | ORAL | Status: DC | PRN
  Administered 2019-07-21: 5 mg via ORAL
  Filled 2019-07-21: qty 1

## 2019-07-21 MED ORDER — ONDANSETRON HCL 4 MG/2ML IJ SOLN: 4.0000 mg | Freq: Four times a day (QID) | INTRAMUSCULAR | Status: DC | PRN

## 2019-07-21 MED ORDER — LORAZEPAM 1 MG PO TABS
1.0000 mg | ORAL_TABLET | ORAL | Status: DC | PRN
  Administered 2019-07-21: 4 mg via ORAL
  Filled 2019-07-21: qty 4

## 2019-07-21 MED ORDER — LACTULOSE 10 GM/15ML PO SOLN
20.0000 g | Freq: Three times a day (TID) | ORAL | Status: DC
  Administered 2019-07-21: 13:00:00 20 g via ORAL
  Filled 2019-07-21: qty 30

## 2019-07-21 MED ORDER — NICOTINE 21 MG/24HR TD PT24
21.0000 mg | MEDICATED_PATCH | Freq: Every day | TRANSDERMAL | Status: DC
  Administered 2019-07-21: 13:00:00 21 mg via TRANSDERMAL
  Filled 2019-07-21: qty 1

## 2019-07-21 MED ORDER — LACTULOSE 10 GM/15ML PO SOLN: 20.0000 g | Freq: Three times a day (TID) | ORAL | 1 refills | Status: DC

## 2019-07-21 MED ORDER — ADULT MULTIVITAMIN W/MINERALS CH
1.0000 | ORAL_TABLET | Freq: Every day | ORAL | Status: DC
  Administered 2019-07-21: 1 via ORAL
  Filled 2019-07-21: qty 1

## 2019-07-21 MED ORDER — SPIRONOLACTONE 100 MG PO TABS: 100.0000 mg | ORAL_TABLET | Freq: Every day | ORAL | 1 refills | Status: DC

## 2019-07-21 MED ORDER — FUROSEMIDE 40 MG PO TABS: 40.0000 mg | ORAL_TABLET | Freq: Every day | ORAL | 1 refills | Status: AC
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
## 2019-07-21 NOTE — Procedures (Signed)
Interventional Radiology Procedure Note  Procedure: US guided paracentesis.  Complications: None Recommendations:  - Routine wound care     Signed,  Lynley Killilea S. Nekoda Chock, DO    

## 2019-07-21 NOTE — Progress Notes (Signed)
Off unit to [place] for paracentesis. Madlyn Frankel, RN

## 2019-07-21 NOTE — ED Notes (Signed)
Sticky note left in pt's prior room with this information: Frederick Cabrera [phone]

## 2019-07-21 NOTE — Discharge Summary (Signed)
[facility] [place] at [facility]   PATIENT NAME: Frederick Cabrera    MR#:  [id number]  DATE OF BIRTH:  1965/12/25  DATE OF ADMISSION:  07/20/2019 ADMITTING PHYSICIAN: Oralia Manis, MD  DATE OF DISCHARGE: 07/21/2019  PRIMARY CARE PHYSICIAN: [facility]    ADMISSION DIAGNOSIS:  Shortness of breath [R06.02] Hyponatremia [E87.1] Ascites due to alcoholic hepatitis [K70.11] Acute pancreatitis, unspecified complication status, unspecified pancreatitis type [K85.90]  DISCHARGE DIAGNOSIS:  *recurrent ascites secondary to cirrhosis alcohol induced status post paracentesis with removal of 7.6 L fluid *chronic alcohol dependence  SECONDARY DIAGNOSIS:   Past Medical History:  Diagnosis Date  . Alcoholic cirrhosis of liver with ascites [facility])     HOSPITAL COURSE:  Frederick Cabrera a53 y.o.malewith a known history of alcoholic liver cirrhosis with ascites with recurrent paracentesis,hypertension,tobacco abuse and chronic alcoholism who presented to the emergency room with complaints of abdominal pain and increasing abdominal distention which shortness of breath  1. Alcoholic cirrhosis of liver with ascites (HCC) -ultrasound paracentesis below 7.6 L of fluid - continue other home meds Lasix and spironolactone. I did call in the prescription for medication management clinic -is not exhibiting any signs symptoms of withdrawal. His vital signs are stable. -Resumed nadolol, Protonix, multivitamin  2.  Alcoholic pancreatitis  -patient does not have abdominal pain. He tolerated diet well. No nausea vomiting. He is advised to abstain from alcohol  3.  Hyponatremia -this is chronic.  In the past he has had levels up to the high 120s to low 130s.  However, recently he is in the low 120s, which is where he is tonight.   -sodium today is 124. -This is due to alcohol consumption.  4.pancytopenia alcoholic cirrhosis of liver  I have asked patient to follow  up with Dr. Ann Maki.I. to avoid admissions for recurrent paracentesis.  Left message for patient's sister-in-law Frederick Cabrera updated. Patient will discharged home later.  Last two times he left AMA. I asked patient not to leave AMA and get his prescriptions before he goes home.  CONSULTS OBTAINED:    DRUG ALLERGIES:   Allergies  Allergen Reactions  . Wool Alcohol [Lanolin] Rash    DISCHARGE MEDICATIONS:   Allergies as of 07/21/2019      Reactions   Wool Alcohol [lanolin] Rash      Medication List    STOP taking these medications   LORazepam 1 MG tablet Commonly known as: Ativan   nicotine 21 mg/24hr patch Commonly known as: NICODERM CQ - dosed in mg/24 hours   polyethylene glycol 17 g packet Commonly known as: MIRALAX / GLYCOLAX   thiamine 100 MG tablet     TAKE these medications   folic acid 1 MG tablet Commonly known as: FOLVITE Take 1 tablet (1 mg total) by mouth daily.   furosemide 40 MG tablet Commonly known as: LASIX Take 1 tablet (40 mg total) by mouth daily.   lactulose 10 GM/15ML solution Commonly known as: CHRONULAC Take 30 mLs (20 g total) by mouth 3 (three) times daily.   multivitamin with minerals Tabs tablet Take 1 tablet by mouth daily.   pantoprazole 40 MG tablet Commonly known as: PROTONIX Take 1 tablet (40 mg total) by mouth 2 (two) times daily before a meal.   propranolol 20 MG tablet Commonly known as: INDERAL Take 1 tablet (20 mg total) by mouth 2 (two) times daily.   spironolactone 100 MG tablet Commonly known as: ALDACTONE Take 1 tablet (100 mg total) by mouth  daily.       If you experience worsening of your admission symptoms, develop shortness of breath, life threatening emergency, suicidal or homicidal thoughts you must seek medical attention immediately by calling 911 or calling your MD immediately  if symptoms less severe.  You Must read complete instructions/literature along with all the possible adverse reactions/side  effects for all the Medicines you take and that have been prescribed to you. Take any new Medicines after you have completely understood and accept all the possible adverse reactions/side effects.   Please note  You were cared for by a hospitalist during your hospital stay. If you have any questions about your discharge medications or the care you received while you were in the hospital after you are discharged, you can call the unit and asked to speak with the hospitalist on call if the hospitalist that took care of you is not available. Once you are discharged, your primary care physician will handle any further medical issues. Please note that NO REFILLS for any discharge medications will be authorized once you are discharged, as it is imperative that you return to your primary care physician (or establish a relationship with a primary care physician if you do not have one) for your aftercare needs so that they can reassess your need for medications and monitor your lab values. Today   SUBJECTIVE   I want to go home  VITAL SIGNS:  Blood pressure 120/79, pulse 74, temperature 97.6 F (36.4 C), temperature source Oral, resp. rate 20, height 5\' 8"  (1.727 m), weight 81.6 kg, SpO2 93 %.  I/O:  No intake or output data in the 24 hours ending 07/21/19 1315  PHYSICAL EXAMINATION:  GENERAL:  [age] y.o.-year-old patient lying in the bed with no acute distress. disheveled EYES: Pupils equal, round, reactive to light and accommodation. No scleral icterus. Extraocular muscles intact.  HEENT: Head atraumatic, normocephalic. Oropharynx and nasopharynx clear.  NECK:  Supple, no jugular venous distention. No thyroid enlargement, no tenderness.  LUNGS: Normal breath sounds bilaterally, no wheezing, rales,rhonchi or crepitation. No use of accessory muscles of respiration.  CARDIOVASCULAR: S1, S2 normal. No murmurs, rubs, or gallops.  ABDOMEN: Soft, non-tender, nmild distended. Bowel sounds present. No  organomegaly or mass.  EXTREMITIES: No pedal edema, cyanosis, or clubbing.  NEUROLOGIC: Cranial nerves II through XII are intact. Muscle strength 5/5 in all extremities. Sensation intact. Gait not checked.  No tremors PSYCHIATRIC: The patient is alert and oriented x 3. SKIN: No obvious rash, lesion, or ulcer.   DATA REVIEW:   CBC  Recent Labs  Lab 07/21/19 0533  WBC 8.8  HGB 10.9*  HCT 29.3*  PLT 61*    Chemistries  Recent Labs  Lab 07/20/19 2047 07/21/19 0533  NA 123* 124*  K 4.1 4.2  CL 94* 96*  CO2 20* 21*  GLUCOSE 157* 113*  BUN 12 11  CREATININE 0.73 0.68  CALCIUM 8.0* 8.1*  AST 98*  --   ALT 39  --   ALKPHOS 211*  --   BILITOT 6.3*  --     Microbiology Results   Recent Results (from the past 240 hour(s))  SARS CORONAVIRUS 2 (TAT 6-24 HRS) Nasopharyngeal Nasopharyngeal Swab     Status: None   Collection Time: 07/17/19 12:24 PM   Specimen: Nasopharyngeal Swab  Result Value Ref Range Status   SARS Coronavirus 2 NEGATIVE NEGATIVE Final    Comment: (NOTE) SARS-CoV-2 target nucleic acids are NOT DETECTED. The SARS-CoV-2 RNA is generally detectable  in upper and lower respiratory specimens during the acute phase of infection. Negative results do not preclude SARS-CoV-2 infection, do not rule out co-infections with other pathogens, and should not be used as the sole basis for treatment or other patient management decisions. Negative results must be combined with clinical observations, patient history, and epidemiological information. The expected result is Negative. Fact Sheet for Patients: HairSlick.no Fact Sheet for Healthcare Providers: quierodirigir.com This test is not yet approved or cleared by the [place] FDA and  has been authorized for detection and/or diagnosis of SARS-CoV-2 by FDA under an Emergency Use Authorization (EUA). This EUA will remain  in effect (meaning this test can be used)  for the duration of the COVID-19 declaration under Section 56 4(b)(1) of the Act, 21 U.S.C. section 360bbb-3(b)(1), unless the authorization is terminated or revoked sooner. Performed at [facility], [address] [place], [place] [address]   SARS CORONAVIRUS 2 (TAT 6-24 HRS) Nasopharyngeal Nasopharyngeal Swab     Status: None   Collection Time: 07/20/19 11:26 PM   Specimen: Nasopharyngeal Swab  Result Value Ref Range Status   SARS Coronavirus 2 NEGATIVE NEGATIVE Final    Comment: (NOTE) SARS-CoV-2 target nucleic acids are NOT DETECTED. The SARS-CoV-2 RNA is generally detectable in upper and lower respiratory specimens during the acute phase of infection. Negative results do not preclude SARS-CoV-2 infection, do not rule out co-infections with other pathogens, and should not be used as the sole basis for treatment or other patient management decisions. Negative results must be combined with clinical observations, patient history, and epidemiological information. The expected result is Negative. Fact Sheet for Patients: HairSlick.no Fact Sheet for Healthcare Providers: quierodirigir.com This test is not yet approved or cleared by the [place] FDA and  has been authorized for detection and/or diagnosis of SARS-CoV-2 by FDA under an Emergency Use Authorization (EUA). This EUA will remain  in effect (meaning this test can be used) for the duration of the COVID-19 declaration under Section 56 4(b)(1) of the Act, 21 U.S.C. section 360bbb-3(b)(1), unless the authorization is terminated or revoked sooner. Performed at [facility], [address] [place], [place] [address]     RADIOLOGY:  Ct Head Wo Contrast  Result Date: 07/20/2019 CLINICAL DATA:  Altered mental status EXAM: CT HEAD WITHOUT CONTRAST CT CERVICAL SPINE WITHOUT CONTRAST TECHNIQUE: Multidetector CT imaging of the head and cervical spine was  performed following the standard protocol without intravenous contrast. Multiplanar CT image reconstructions of the cervical spine were also generated. COMPARISON:  None. FINDINGS: CT HEAD FINDINGS Brain: Mild atrophic changes are noted. No findings to suggest acute hemorrhage, acute infarction or space-occupying mass lesion are noted. Vascular: No hyperdense vessel or unexpected calcification. Skull: Normal. Negative for fracture or focal lesion. Sinuses/Orbits: Air-fluid level is noted within the sphenoid sinus on the right. No other specific sinus abnormality is noted Other: None. CT CERVICAL SPINE FINDINGS Alignment: Normal. Skull base and vertebrae: 7 cervical segments are well visualized. Vertebral body height is well maintained. No acute fracture or acute facet abnormality is noted. Soft tissues and spinal canal: Surrounding soft tissues are within normal limits. Upper chest: Visualized lung apices are within normal limits. Other: None IMPRESSION: CT of the head: Chronic atrophic changes without acute abnormality. Air-fluid level within the right sphenoid sinus of uncertain chronicity. CT of the cervical spine: No acute abnormality noted. Electronically Signed   By: Alcide Clever M.D.   On: 07/20/2019 23:17   Ct Cervical  Spine Wo Contrast  Result Date: 07/20/2019 CLINICAL DATA:  Altered mental status EXAM: CT HEAD WITHOUT CONTRAST CT CERVICAL SPINE WITHOUT CONTRAST TECHNIQUE: Multidetector CT imaging of the head and cervical spine was performed following the standard protocol without intravenous contrast. Multiplanar CT image reconstructions of the cervical spine were also generated. COMPARISON:  None. FINDINGS: CT HEAD FINDINGS Brain: Mild atrophic changes are noted. No findings to suggest acute hemorrhage, acute infarction or space-occupying mass lesion are noted. Vascular: No hyperdense vessel or unexpected calcification. Skull: Normal. Negative for fracture or focal lesion. Sinuses/Orbits: Air-fluid  level is noted within the sphenoid sinus on the right. No other specific sinus abnormality is noted Other: None. CT CERVICAL SPINE FINDINGS Alignment: Normal. Skull base and vertebrae: 7 cervical segments are well visualized. Vertebral body height is well maintained. No acute fracture or acute facet abnormality is noted. Soft tissues and spinal canal: Surrounding soft tissues are within normal limits. Upper chest: Visualized lung apices are within normal limits. Other: None IMPRESSION: CT of the head: Chronic atrophic changes without acute abnormality. Air-fluid level within the right sphenoid sinus of uncertain chronicity. CT of the cervical spine: No acute abnormality noted. Electronically Signed   By: Inez Catalina M.D.   On: 07/20/2019 23:17   US Abdomen Complete  Result Date: 07/20/2019 CLINICAL DATA:  Abdominal pain and elevated lipase EXAM: ABDOMEN ULTRASOUND COMPLETE COMPARISON:  01/19/2019 FINDINGS: Gallbladder: Gallbladder is well distended with multiple gallbladder sludge. The wall is mildly thickened at 4 mm although likely related to the underlying ascites. Common bile duct: Diameter: 5.6 mm Liver: Cirrhotic change of the liver is noted. No focal mass is seen. Portal vein is patent on color Doppler imaging with reversed direction of blood flow away from the liver. IVC: Not well visualized Pancreas: Not well visualized Spleen: Size and appearance within normal limits. Right Kidney: Length: 11.1 cm. Echogenicity within normal limits. No mass or hydronephrosis visualized. Left Kidney: Length: 12.0 cm. Echogenicity within normal limits. No mass or hydronephrosis visualized. Abdominal aorta: No aneurysm visualized. Other findings: Ascites is noted throughout the abdomen. IMPRESSION: Changes consistent with cirrhosis of the liver with ascites. Some compensatory gallbladder wall thickening is noted. Gallbladder sludge. The pancreas is not well visualized. Electronically Signed   By: Inez Catalina M.D.   On:  07/20/2019 23:06   US Paracentesis  Result Date: 07/21/2019 INDICATION: 53 year old male with a history of recurrent ascites EXAM: ULTRASOUND GUIDED  PARACENTESIS MEDICATIONS: None. COMPLICATIONS: None PROCEDURE: Informed written consent was obtained from the patient after a discussion of the risks, benefits and alternatives to treatment. A timeout was performed prior to the initiation of the procedure. Initial ultrasound scanning demonstrates a large amount of ascites within the right lower abdominal quadrant. The right lower abdomen was prepped and draped in the usual sterile fashion. 1% lidocaine was used for local anesthesia. Following this, a 8 Fr Safe-T-Centesis catheter was introduced. An ultrasound image was saved for documentation purposes. The paracentesis was performed. The catheter was removed and a dressing was applied. The patient tolerated the procedure well without immediate post procedural complication. Patient received post-procedure intravenous albumin; see nursing notes for details. FINDINGS: A total of approximately 7.6 L of yellow fluid was removed. IMPRESSION: Status post ultrasound-guided paracentesis. Signed, Dulcy Fanny. Dellia Nims, RPVI [facility] Electronically Signed   By: Corrie Mckusick D.O.   On: 07/21/2019 10:56   Dg Chest Portable 1 View  Result Date: 07/20/2019 CLINICAL DATA:  Shortness of  breath EXAM: PORTABLE CHEST 1 VIEW COMPARISON:  July 17, 2019 FINDINGS: The mediastinal contour is normal. Heart size is enlarged. There is pulmonary edema. There is no pleural effusion or focal pneumonia. The bony structures are stable. IMPRESSION: Cardiomegaly and pulmonary edema. Electronically Signed   By: Sherian Rein M.D.   On: 07/20/2019 21:41     CODE STATUS:     Code Status Orders  (From admission, onward)         Start     Ordered   07/21/19 0220  Full code  Continuous     07/21/19 0219        Code  Status History    Date Active Date Inactive Code Status Order ID Comments User Context   07/08/2019 1716 07/09/2019 1557 Full Code [id number]  Jama Flavors, MD ED   03/08/2019 0923 03/09/2019 1743 Full Code [id number]  Arnaldo Natal, MD Inpatient   02/13/2019 2039 02/16/2019 1953 Full Code [id number]  Pearletha Alfred, NP ED   02/06/2019 0031 02/06/2019 1835 Full Code [id number]  Oralia Manis, MD Inpatient   01/17/2019 0930 01/23/2019 1905 Full Code [id number]  Ramonita Lab, MD ED   01/17/2019 0810 01/17/2019 0930 Full Code [id number]  Nita Sickle, MD ED   Advance Care Planning Activity      TOTAL TIME TAKING CARE OF THIS PATIENT: *40 minutes.    Enedina Finner M.D on 07/21/2019 at 1:15 PM  Between 7am to 6pm - Pager - [phone] After 6pm go to [email or web address] - Social research officer, government  [facility]  Office  [phone]  CC: Primary care physician; [facility]

## 2019-07-21 NOTE — Discharge Instructions (Signed)
Abstain from drinking alcohol °

## 2019-07-21 NOTE — ED Notes (Signed)
Report called to brandy rn floor nurse 

## 2019-07-22 ENCOUNTER — Ambulatory Visit: Payer: Medicaid Other | Admitting: Gastroenterology

## 2019-07-24 LAB — BODY FLUID CULTURE: Culture: NO GROWTH

## 2019-07-28 ENCOUNTER — Other Ambulatory Visit: Payer: Self-pay

## 2019-07-28 ENCOUNTER — Emergency Department: Payer: Medicaid Other

## 2019-07-28 ENCOUNTER — Inpatient Hospital Stay
Admission: EM | Admit: 2019-07-28 | Discharge: 2019-07-29 | DRG: 433 | Discharge status: Against Medical Advice | Payer: Medicaid Other | Attending: Internal Medicine | Admitting: Internal Medicine

## 2019-07-28 ENCOUNTER — Encounter: Payer: Self-pay | Admitting: Emergency Medicine

## 2019-07-28 DIAGNOSIS — E871 Hypo-osmolality and hyponatremia: Secondary | ICD-10-CM | POA: Diagnosis present

## 2019-07-28 DIAGNOSIS — D696 Thrombocytopenia, unspecified: Secondary | ICD-10-CM | POA: Diagnosis present

## 2019-07-28 DIAGNOSIS — R1084 Generalized abdominal pain: Secondary | ICD-10-CM

## 2019-07-28 DIAGNOSIS — Z5329 Procedure and treatment not carried out because of patient's decision for other reasons: Secondary | ICD-10-CM | POA: Diagnosis present

## 2019-07-28 DIAGNOSIS — R188 Other ascites: Secondary | ICD-10-CM | POA: Diagnosis present

## 2019-07-28 DIAGNOSIS — K7031 Alcoholic cirrhosis of liver with ascites: Secondary | ICD-10-CM | POA: Diagnosis present

## 2019-07-28 DIAGNOSIS — E8770 Fluid overload, unspecified: Secondary | ICD-10-CM | POA: Diagnosis present

## 2019-07-28 DIAGNOSIS — Z91048 Other nonmedicinal substance allergy status: Secondary | ICD-10-CM | POA: Diagnosis not present

## 2019-07-28 DIAGNOSIS — Z20828 Contact with and (suspected) exposure to other viral communicable diseases: Secondary | ICD-10-CM | POA: Diagnosis present

## 2019-07-28 DIAGNOSIS — F17210 Nicotine dependence, cigarettes, uncomplicated: Secondary | ICD-10-CM | POA: Diagnosis present

## 2019-07-28 DIAGNOSIS — E872 Acidosis, unspecified: Secondary | ICD-10-CM | POA: Diagnosis present

## 2019-07-28 DIAGNOSIS — Z79899 Other long term (current) drug therapy: Secondary | ICD-10-CM

## 2019-07-28 DIAGNOSIS — F1010 Alcohol abuse, uncomplicated: Secondary | ICD-10-CM | POA: Diagnosis present

## 2019-07-28 DIAGNOSIS — D7589 Other specified diseases of blood and blood-forming organs: Secondary | ICD-10-CM | POA: Diagnosis present

## 2019-07-28 LAB — PROTIME-INR
INR: 1.5 — ABNORMAL HIGH (ref 0.8–1.2)
Prothrombin Time: 17.5 seconds — ABNORMAL HIGH (ref 11.4–15.2)

## 2019-07-28 LAB — COMPREHENSIVE METABOLIC PANEL
ALT: 37 U/L (ref 0–44)
AST: 99 U/L — ABNORMAL HIGH (ref 15–41)
Albumin: 2.1 g/dL — ABNORMAL LOW (ref 3.5–5.0)
Alkaline Phosphatase: 141 U/L — ABNORMAL HIGH (ref 38–126)
Anion gap: 9 (ref 5–15)
BUN: 9 mg/dL (ref 6–20)
CO2: 18 mmol/L — ABNORMAL LOW (ref 22–32)
Calcium: 7.9 mg/dL — ABNORMAL LOW (ref 8.9–10.3)
Chloride: 95 mmol/L — ABNORMAL LOW (ref 98–111)
Creatinine, Ser: 0.6 mg/dL — ABNORMAL LOW (ref 0.61–1.24)
GFR calc Af Amer: >60 mL/min (ref >60)
GFR calc non Af Amer: >60 mL/min (ref >60)
Glucose, Bld: 165 mg/dL — ABNORMAL HIGH (ref 70–99)
Potassium: 4 mmol/L (ref 3.5–5.1)
Sodium: 122 mmol/L — ABNORMAL LOW (ref 135–145)
Total Bilirubin: 7.8 mg/dL — ABNORMAL HIGH (ref 0.3–1.2)
Total Protein: 7.8 g/dL (ref 6.5–8.1)

## 2019-07-28 LAB — CBC
HCT: 32.9 % — ABNORMAL LOW (ref 39.0–52.0)
Hemoglobin: 12 g/dL — ABNORMAL LOW (ref 13.0–17.0)
MCH: 37.3 pg — ABNORMAL HIGH (ref 26.0–34.0)
MCHC: 36.5 g/dL — ABNORMAL HIGH (ref 30.0–36.0)
MCV: 102.2 fL — ABNORMAL HIGH (ref 80.0–100.0)
Platelets: 71 10*3/uL — ABNORMAL LOW (ref 150–400)
RBC: 3.22 MIL/uL — ABNORMAL LOW (ref 4.22–5.81)
RDW: 15.7 % — ABNORMAL HIGH (ref 11.5–15.5)
WBC: 6.9 10*3/uL (ref 4.0–10.5)
nRBC: 0 % (ref 0.0–0.2)

## 2019-07-28 LAB — AMMONIA: Ammonia: 89 umol/L — ABNORMAL HIGH (ref 9–35)

## 2019-07-28 LAB — URINALYSIS, COMPLETE (UACMP) WITH MICROSCOPIC
Bacteria, UA: NONE SEEN
Glucose, UA: NEGATIVE mg/dL
Ketones, ur: NEGATIVE mg/dL
Leukocytes,Ua: NEGATIVE
Nitrite: NEGATIVE
Protein, ur: NEGATIVE mg/dL
Specific Gravity, Urine: 1.023 (ref 1.005–1.030)
Squamous Epithelial / HPF: NONE SEEN (ref 0–5)
pH: 5 (ref 5.0–8.0)

## 2019-07-28 LAB — LIPASE, BLOOD: Lipase: 118 U/L — ABNORMAL HIGH (ref 11–51)

## 2019-07-28 LAB — TROPONIN I (HIGH SENSITIVITY): Troponin I (High Sensitivity): 41 ng/L — ABNORMAL HIGH (ref <18)

## 2019-07-28 MED ORDER — ACETAMINOPHEN 325 MG PO TABS: 650.0000 mg | ORAL_TABLET | Freq: Four times a day (QID) | ORAL | Status: DC | PRN

## 2019-07-28 MED ORDER — FUROSEMIDE 40 MG PO TABS
40.0000 mg | ORAL_TABLET | Freq: Every day | ORAL | Status: DC
  Administered 2019-07-28 – 2019-07-29 (×2): 40 mg via ORAL
  Filled 2019-07-28 (×2): qty 1

## 2019-07-28 MED ORDER — ONDANSETRON HCL 4 MG/2ML IJ SOLN: 4.0000 mg | Freq: Four times a day (QID) | INTRAMUSCULAR | Status: DC | PRN

## 2019-07-28 MED ORDER — ADULT MULTIVITAMIN W/MINERALS CH: 1.0000 | ORAL_TABLET | Freq: Every day | ORAL | Status: DC

## 2019-07-28 MED ORDER — LACTULOSE 10 GM/15ML PO SOLN
20.0000 g | Freq: Three times a day (TID) | ORAL | Status: DC
  Administered 2019-07-28 – 2019-07-29 (×2): 20 g via ORAL
  Filled 2019-07-28 (×5): qty 30

## 2019-07-28 MED ORDER — ONDANSETRON HCL 4 MG PO TABS
4.0000 mg | ORAL_TABLET | Freq: Four times a day (QID) | ORAL | Status: DC | PRN
  Filled 2019-07-28: qty 1

## 2019-07-28 MED ORDER — PANTOPRAZOLE SODIUM 40 MG PO TBEC
40.0000 mg | DELAYED_RELEASE_TABLET | Freq: Two times a day (BID) | ORAL | Status: DC
  Administered 2019-07-29: 08:00:00 40 mg via ORAL
  Filled 2019-07-28: qty 1

## 2019-07-28 MED ORDER — LORAZEPAM 2 MG/ML IJ SOLN: 1.0000 mg | INTRAMUSCULAR | Status: DC | PRN

## 2019-07-28 MED ORDER — LORAZEPAM 2 MG/ML IJ SOLN: 0.0000 mg | Freq: Two times a day (BID) | INTRAMUSCULAR | Status: DC

## 2019-07-28 MED ORDER — SPIRONOLACTONE 100 MG PO TABS
100.0000 mg | ORAL_TABLET | Freq: Every day | ORAL | Status: DC
  Administered 2019-07-29: 11:00:00 100 mg via ORAL
  Filled 2019-07-28: qty 1

## 2019-07-28 MED ORDER — LORAZEPAM 1 MG PO TABS: 1.0000 mg | ORAL_TABLET | ORAL | Status: DC | PRN

## 2019-07-28 MED ORDER — FOLIC ACID 1 MG PO TABS
1.0000 mg | ORAL_TABLET | Freq: Every day | ORAL | Status: DC
  Administered 2019-07-28 – 2019-07-29 (×2): 1 mg via ORAL
  Filled 2019-07-28 (×2): qty 1

## 2019-07-28 MED ORDER — VITAMIN B-1 100 MG PO TABS
100.0000 mg | ORAL_TABLET | Freq: Every day | ORAL | Status: DC
  Administered 2019-07-28 – 2019-07-29 (×2): 100 mg via ORAL
  Filled 2019-07-28 (×2): qty 1

## 2019-07-28 MED ORDER — LORAZEPAM 2 MG/ML IJ SOLN: 0.0000 mg | Freq: Four times a day (QID) | INTRAMUSCULAR | Status: DC

## 2019-07-28 MED ORDER — SODIUM CHLORIDE 0.9 % IV SOLN
1.0000 g | INTRAVENOUS | Status: DC
  Administered 2019-07-28: 19:00:00 1 g via INTRAVENOUS
  Filled 2019-07-28 (×2): qty 10

## 2019-07-28 MED ORDER — ACETAMINOPHEN 650 MG RE SUPP
650.0000 mg | Freq: Four times a day (QID) | RECTAL | Status: DC | PRN
  Filled 2019-07-28: qty 1

## 2019-07-28 MED ORDER — ADULT MULTIVITAMIN W/MINERALS CH
1.0000 | ORAL_TABLET | Freq: Every day | ORAL | Status: DC
  Administered 2019-07-28 – 2019-07-29 (×2): 1 via ORAL
  Filled 2019-07-28 (×2): qty 1

## 2019-07-28 MED ORDER — ALBUMIN HUMAN 25 % IV SOLN
25.0000 g | Freq: Four times a day (QID) | INTRAVENOUS | Status: AC
  Administered 2019-07-28 – 2019-07-29 (×2): 25 g via INTRAVENOUS
  Filled 2019-07-28 (×2): qty 100

## 2019-07-28 MED ORDER — THIAMINE HCL 100 MG/ML IJ SOLN
100.0000 mg | Freq: Every day | INTRAMUSCULAR | Status: DC
  Filled 2019-07-28: qty 1

## 2019-07-28 MED ORDER — PROPRANOLOL HCL 20 MG PO TABS
20.0000 mg | ORAL_TABLET | Freq: Two times a day (BID) | ORAL | Status: DC
  Administered 2019-07-28 – 2019-07-29 (×2): 20 mg via ORAL
  Filled 2019-07-28 (×4): qty 1

## 2019-07-28 NOTE — ED Provider Notes (Signed)
[facility] Emergency Department Provider Note   ____________________________________________   First MD Initiated Contact with Patient 07/28/19 1500     (approximate)  I have reviewed the triage vital signs and the nursing notes.   HISTORY  Chief Complaint Abdominal Pain and Ascites    HPI Frederick Cabrera is a 53 y.o. male with past medical history of alcohol abuse, cirrhosis, and ascites who presents to the ED complaining of abdominal distention and pain.  Patient reports he has had 3 to 4 days of increasing abdominal distention that has become very painful for him.  He also feels like it is putting pressure on his lungs and making it difficult for him to breathe.  He denies any fevers or confusion.  He does admit to ongoing alcohol abuse with his last drink being a couple of beers this morning.  He states that his brother encourages him to continue to drink and he has been drinking vodka at times with his brother as well as additional beers.  He now expresses a desire to stop drinking and is requesting assistance with alcohol detox.        Past Medical History:  Diagnosis Date  . Alcoholic cirrhosis of liver with ascites [facility](HCC)     Patient Active Problem List   Diagnosis Date Noted  . 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 Hyponatremia 07/20/2019  . Ascites 07/08/2019  . Hypoxia 03/08/2019  . Respiratory distress 03/08/2019  . Abdominal pain 02/14/2019  . Ascites due to alcoholic cirrhosis (HCC) 02/13/2019  . Alcoholic cirrhosis of liver with ascites (HCC)   . Goals of care, counseling/discussion   . Palliative care by specialist   . Hematemesis 01/17/2019    Past Surgical History:  Procedure Laterality Date  . ESOPHAGOGASTRODUODENOSCOPY (EGD) WITH PROPOFOL N/A 01/17/2019   Procedure: ESOPHAGOGASTRODUODENOSCOPY (EGD) WITH PROPOFOL;  Surgeon: Toledo, Boykin Nearingeodoro K, MD;  Location: [facility] ENDOSCOPY;  Service: Gastroenterology;  Laterality: N/A;     Prior to Admission medications   Medication Sig Start Date End Date Taking? Authorizing Provider  folic acid (FOLVITE) 1 MG tablet Take 1 tablet (1 mg total) by mouth daily. Patient not taking: Reported on 07/08/2019 02/17/19   Jimmye Normanuma, Elizabeth Achieng, NP  furosemide (LASIX) 40 MG tablet Take 1 tablet (40 mg total) by mouth daily. 07/21/19 07/20/20  Enedina FinnerPatel, Sona, MD  lactulose (CHRONULAC) 10 GM/15ML solution Take 30 mLs (20 g total) by mouth 3 (three) times daily. 07/21/19   Enedina FinnerPatel, Sona, MD  Multiple Vitamin (MULTIVITAMIN WITH MINERALS) TABS tablet Take 1 tablet by mouth daily. 07/21/19   Enedina FinnerPatel, Sona, MD  pantoprazole (PROTONIX) 40 MG tablet Take 1 tablet (40 mg total) by mouth 2 (two) times daily before a meal. 07/21/19   Enedina FinnerPatel, Sona, MD  propranolol (INDERAL) 20 MG tablet Take 1 tablet (20 mg total) by mouth 2 (two) times daily. 07/21/19   Enedina FinnerPatel, Sona, MD  spironolactone (ALDACTONE) 100 MG tablet Take 1 tablet (100 mg total) by mouth daily. 07/21/19   Enedina FinnerPatel, Sona, MD    Allergies Wool alcohol [lanolin]  Family History  Problem Relation Age of Onset  . Heart disease Father     Social History Social History   Tobacco Use  . Smoking status: Current Every Day Smoker    Packs/day: 0.50    Years: 20.00    Pack years: 10.00    Types: Cigarettes  . Smokeless tobacco: Never Used  Substance Use Topics  . Alcohol use: Yes    Comment: last drink  07/17/19  . Drug use: Not Currently    Review of Systems  Constitutional: No fever/chills Eyes: No visual changes. ENT: No sore throat. Cardiovascular: Denies chest pain. Respiratory: Positive for shortness of breath. Gastrointestinal: Positive for abdominal pain.  No nausea, no vomiting.  No diarrhea.  No constipation. Genitourinary: Negative for dysuria. Musculoskeletal: Negative for back pain. Skin: Negative for rash. Neurological: Negative for headaches, focal weakness or numbness.  ____________________________________________    PHYSICAL EXAM:  VITAL SIGNS: ED Triage Vitals  Enc Vitals Group     BP 07/28/19 1258 135/75     Pulse Rate 07/28/19 1258 (!) 107     Resp 07/28/19 1258 16     Temp 07/28/19 1258 98.4 F (36.9 C)     Temp Source 07/28/19 1258 Oral     SpO2 07/28/19 1258 97 %     Weight --      Height --      Head Circumference --      Peak Flow --      Pain Score 07/28/19 1256 10     Pain Loc --      Pain Edu? --      Excl. in [place]? --     Constitutional: Alert and oriented. Eyes: Conjunctivae are normal. Head: Atraumatic. Nose: No congestion/rhinnorhea. Mouth/Throat: Mucous membranes are moist. Neck: Normal ROM Cardiovascular: Normal rate, regular rhythm. Grossly normal heart sounds. Respiratory: Normal respiratory effort.  No retractions. Lungs CTAB. Gastrointestinal: Significantly distended abdomen which is firm and diffusely tender to palpation. Genitourinary: deferred Musculoskeletal: No lower extremity tenderness, 2+ pitting edema to bilateral lower extremities. Neurologic:  Normal speech and language. No gross focal neurologic deficits are appreciated. Skin:  Skin is warm, dry and intact. No rash noted. Psychiatric: Mood and affect are normal. Speech and behavior are normal.  ____________________________________________   LABS (all labs ordered are listed, but only abnormal results are displayed)  Labs Reviewed  LIPASE, BLOOD - Abnormal; Notable for the following components:      Result Value   Lipase 118 (*)    All other components within normal limits  COMPREHENSIVE METABOLIC PANEL - Abnormal; Notable for the following components:   Sodium 122 (*)    Chloride 95 (*)    CO2 18 (*)    Glucose, Bld 165 (*)    Creatinine, Ser 0.60 (*)    Calcium 7.9 (*)    Albumin 2.1 (*)    AST 99 (*)    Alkaline Phosphatase 141 (*)    Total Bilirubin 7.8 (*)    All other components within normal limits  CBC - Abnormal; Notable for the following components:   RBC 3.22 (*)    Hemoglobin  12.0 (*)    HCT 32.9 (*)    MCV 102.2 (*)    MCH 37.3 (*)    MCHC 36.5 (*)    RDW 15.7 (*)    Platelets 71 (*)    All other components within normal limits  URINALYSIS, COMPLETE (UACMP) WITH MICROSCOPIC - Abnormal; Notable for the following components:   Color, Urine AMBER (*)    APPearance HAZY (*)    Hgb urine dipstick SMALL (*)    Bilirubin Urine SMALL (*)    All other components within normal limits  AMMONIA  PROTIME-INR  TROPONIN I (HIGH SENSITIVITY)   ____________________________________________  EKG  ED ECG REPORT I, Blake Divine, the attending physician, personally viewed and interpreted this ECG.   Date: 07/28/2019  EKG Time: 15:55  Rate: 94  Rhythm: normal sinus rhythm  Axis: LAD  Intervals:left anterior fascicular block  ST&T Change: Poor R wave progression   PROCEDURES  Procedure(s) performed (including Critical Care):  Procedures   ____________________________________________   INITIAL IMPRESSION / ASSESSMENT AND PLAN / ED COURSE       53 year old male with history of alcohol abuse and cirrhosis presents to the ED with increasing abdominal distention and pain over the past 3 to 4 days.  He does have some diffuse tenderness on exam and would benefit from paracentesis for therapeutic reasons as well as to rule out SBP.  He does not appear systemically ill or septic at this time and would hold off on empiric antibiotics.  Labs are significant for hyponatremia, which is a chronic issue for this patient due to his alcohol abuse, but is slightly worse today.  Remainder of labs are also consistent with his known cirrhosis.  He would benefit from admission for paracentesis and further management of hyponatremia as well as alcohol detox.  Case discussed with hospitalist, who accepts patient for admission.      ____________________________________________   FINAL CLINICAL IMPRESSION(S) / ED DIAGNOSES  Final diagnoses:  Alcoholic cirrhosis of liver with  ascites (HCC)  Hyponatremia  Generalized abdominal pain     ED Discharge Orders    None       Note:  This document was prepared using Dragon voice recognition software and may include unintentional dictation errors.   Chesley Noon, MD 07/28/19 1601

## 2019-07-28 NOTE — H&P (Signed)
[facility] History and Physical   Patient: Frederick Cabrera IRW:[id number]   PCP: [facility] DOB: 1966/08/20   DOA: 07/28/2019   DOS: 07/28/2019   DOS: the patient was seen and examined on 07/28/2019  Patient coming from: The patient is coming from Home  Chief Complaint:   HPI: Frederick Cabrera is a 53 y.o. male with Past medical history of chronic hyponatremia, alcohol abuse, active smoker, thrombocytopenia, recurrent ascites. Patient presents with complaints of abdominal pain and distention. He mentions that this is ongoing for last 2 days. He denies any nausea vomiting denies any fever or chills. He also denies any cough but reports that his distention of the belly is making it difficult for him to breathe. He tells that he has 3 bowel movements already. He mentions that he has not received any medication from the [facility] so far and therefore he is not taking his Lasix or lactulose. He continues to drink alcohol he drinks 6 cans of beer on a daily basis.  His last drink was today. He continues to smoke as well as smokes a pack a day of cigarettes. Denies any other drug abuse.  ED Course: Presents with complaints of cough shortness of breath.  Work-up shows worsening hyponatremia.  Patient is significantly distended.  Ultrasound paracentesis ordered and patient was referred for admission for concern for SBP.  At his baseline ambulates with assistance independent for most of his ADL;  manages his medication on his own.  Review of Systems: as mentioned in the history of present illness.  All other systems reviewed and are negative.  Past Medical History:  Diagnosis Date  . Alcoholic cirrhosis of liver with ascites [facility])    Past Surgical History:  Procedure Laterality Date  . ESOPHAGOGASTRODUODENOSCOPY (EGD) WITH PROPOFOL N/A 01/17/2019   Procedure: ESOPHAGOGASTRODUODENOSCOPY (EGD) WITH PROPOFOL;  Surgeon: Toledo, Boykin Nearing, MD;  Location: [facility] ENDOSCOPY;   Service: Gastroenterology;  Laterality: N/A;   Social History:  reports that he has been smoking cigarettes. He has a 10.00 pack-year smoking history. He has never used smokeless tobacco. He reports current alcohol use. He reports previous drug use.  Allergies  Allergen Reactions  . Wool Alcohol [Lanolin] Rash    Family history reviewed and not pertinent Family History  Problem Relation Age of Onset  . Heart disease Father      Prior to Admission medications   Medication Sig Start Date End Date Taking? Authorizing Provider  furosemide (LASIX) 40 MG tablet Take 1 tablet (40 mg total) by mouth daily. 07/21/19 07/20/20 Yes Enedina Finner, MD  lactulose (CHRONULAC) 10 GM/15ML solution Take 30 mLs (20 g total) by mouth 3 (three) times daily. 07/21/19  Yes Enedina Finner, MD  Multiple Vitamin (MULTIVITAMIN WITH MINERALS) TABS tablet Take 1 tablet by mouth daily. 07/21/19  Yes Enedina Finner, MD  pantoprazole (PROTONIX) 40 MG tablet Take 1 tablet (40 mg total) by mouth 2 (two) times daily before a meal. 07/21/19  Yes Enedina Finner, MD  propranolol (INDERAL) 20 MG tablet Take 1 tablet (20 mg total) by mouth 2 (two) times daily. 07/21/19  Yes Enedina Finner, MD  spironolactone (ALDACTONE) 100 MG tablet Take 1 tablet (100 mg total) by mouth daily. 07/21/19  Yes Enedina Finner, MD  folic acid (FOLVITE) 1 MG tablet Take 1 tablet (1 mg total) by mouth daily. Patient not taking: Reported on 07/08/2019 02/17/19   Jimmye Norman, NP    Physical Exam: Vitals:   07/28/19 1258 07/28/19 1500 07/28/19 1530  07/28/19 1757  BP: 135/75 (!) 148/86 125/75 (!) 143/88  Pulse: (!) 107 94 100 97  Resp: 16     Temp: 98.4 F (36.9 C)     TempSrc: Oral     SpO2: 97% 96% 97%     General: alert and oriented to time, place, and person. Appear in mild distress, affect appropriate Eyes: PERRL, Conjunctiva normal ENT: Oral Mucosa Clear, moist  Neck: difficult to assess  JVD, no Abnormal Mass Or lumps Cardiovascular: S1  and S2 Present, no Murmur, peripheral pulses symmetrical Respiratory: good respiratory effort, Bilateral Air entry equal and Decreased, no signs of accessory muscle use, Clear to Auscultation, no Crackles, no wheezes Abdomen: Bowel Sound present, Soft and distended, mild diffuse tenderness, no hernia Skin: no rashes  Extremities: bilateral  Pedal edema, no calf tenderness Neurologic: without any new focal findings Gait not checked due to patient safety concerns  Data Reviewed: I have personally reviewed and interpreted labs, imaging as discussed below.  CBC: Recent Labs  Lab 07/28/19 1302  WBC 6.9  HGB 12.0*  HCT 32.9*  MCV 102.2*  PLT 71*   Basic Metabolic Panel: Recent Labs  Lab 07/28/19 1302  NA 122*  K 4.0  CL 95*  CO2 18*  GLUCOSE 165*  BUN 9  CREATININE 0.60*  CALCIUM 7.9*   GFR: Estimated Creatinine Clearance: 104.5 mL/min (A) (by C-G formula based on SCr of 0.6 mg/dL (L)). Liver Function Tests: Recent Labs  Lab 07/28/19 1302  AST 99*  ALT 37  ALKPHOS 141*  BILITOT 7.8*  PROT 7.8  ALBUMIN 2.1*   Recent Labs  Lab 07/28/19 1302  LIPASE 118*   Recent Labs  Lab 07/28/19 1559  AMMONIA 89*   Coagulation Profile: Recent Labs  Lab 07/28/19 1559  INR 1.5*   Cardiac Enzymes: No results for input(s): CKTOTAL, CKMB, CKMBINDEX, TROPONINI in the last 168 hours. BNP (last 3 results) No results for input(s): PROBNP in the last 8760 hours. HbA1C: No results for input(s): HGBA1C in the last 72 hours. CBG: No results for input(s): GLUCAP in the last 168 hours. Lipid Profile: No results for input(s): CHOL, HDL, LDLCALC, TRIG, CHOLHDL, LDLDIRECT in the last 72 hours. Thyroid Function Tests: No results for input(s): TSH, T4TOTAL, FREET4, T3FREE, THYROIDAB in the last 72 hours. Anemia Panel: No results for input(s): VITAMINB12, FOLATE, FERRITIN, TIBC, IRON, RETICCTPCT in the last 72 hours. Urine analysis:    Component Value Date/Time   COLORURINE AMBER  (A) 07/28/2019 1457   APPEARANCEUR HAZY (A) 07/28/2019 1457   LABSPEC 1.023 07/28/2019 1457   PHURINE 5.0 07/28/2019 1457   GLUCOSEU NEGATIVE 07/28/2019 1457   HGBUR SMALL (A) 07/28/2019 1457   BILIRUBINUR SMALL (A) 07/28/2019 1457   KETONESUR NEGATIVE 07/28/2019 1457   PROTEINUR NEGATIVE 07/28/2019 1457   NITRITE NEGATIVE 07/28/2019 1457   LEUKOCYTESUR NEGATIVE 07/28/2019 1457    Radiological Exams on Admission: Dg Chest Portable 1 View  Result Date: 07/28/2019 CLINICAL DATA:  Shortness of breath EXAM: PORTABLE CHEST 1 VIEW COMPARISON:  07/20/2019 FINDINGS: Diffuse interstitial pulmonary opacity and pulmonary vascular prominence similar to prior examination. No new or focal airspace opacity. Unchanged cardiomegaly. IMPRESSION: 1. Diffuse interstitial pulmonary opacity and pulmonary vascular prominence similar to prior examination. Findings are most consistent with edema. No new or focal airspace opacity. 2.  Unchanged cardiomegaly. Electronically Signed   By: Lauralyn PrimesAlex  Bibbey M.D.   On: 07/28/2019 16:23   I reviewed all nursing notes, pharmacy notes, vitals, pertinent old records.  Assessment/Plan 1. Ascites Alcohol cirrhosis. Recurrent nature due to patient's poor compliance with medical regimen. Not taking his Lasix. At present we will request ultrasound paracentesis. Patient has been will be consulted. Albumin will be provided. Continue with Lasix and Aldactone for tonight along with albumin support.  2.  Concern for SBP Start the patient on IV ceftriaxone. Follow-up results of the paracentesis. Albumin support.  3.  Hypervolemic hypoosmolar hyponatremia. From cirrhosis. Patient asymptomatic for now. Monitor for now.  4.  Metabolic acidosis. Etiology not clear. Monitor for now. No intervention.  5.  Hyperbilirubinemia. No evidence of obstruction on the LFTs. Monitor for now.  6.  Alcoholic cirrhosis. 27 points MELD Score (2016) 19.6% Estimated 78-Month Mortality May  benefit from palliative care consultation regarding goals of care discussion in a patient who has recurrent ascites as well as currently drink alcohol. Patient is willing to quit alcohol for now. Social worker consulted  7.  Thrombocytopenia. Currently relatively stable. Monitor for now. In the setting of liver cirrhosis. At risk for spontaneous bleeding.  8.  Active smoker. Does not show any desire to quit for now. Nicotine patch. Nutrition: Cardiac diet DVT Prophylaxis: SCD, pharmacological prophylaxis contraindicated due to Thrombocytopenia  Advance goals of care discussion: Full code   Consults: none   Family Communication: no family was present at bedside, at the time of interview.   Disposition: Admitted as inpatient, med-surge unit. Likely to be discharged home, in 2-3 days.  I have discussed plan of care as described above with RN and patient/family.  Author: Berle Mull, MD [facility] 07/28/2019 6:04 PM   To reach On-call, see care teams to locate the attending and reach out to them via [email or web address]. If 7PM-7AM, please contact night-coverage If you still have difficulty reaching the attending provider, please page the [facility] (Director on Call) for [facility] on amion for assistance.

## 2019-07-28 NOTE — ED Triage Notes (Signed)
Pt comes into the ED via EMS from home with c/o abd swelling with a hx of cirrhosis 173/97, 114HR, 98%RA, 99.4, CBG 208

## 2019-07-28 NOTE — ED Triage Notes (Signed)
Pt from home via EMS with c/o abd pain. PT hx of cirrhosis, abd noted to be very distended. PT states he also needs detox from ETOH. Last drink was this morning prior to arrivalt. PT noted to be jaundice.

## 2019-07-28 NOTE — ED Notes (Signed)
Pt given phone to speak with brother. Pt assisted to bathroom.

## 2019-07-28 NOTE — ED Notes (Addendum)
Pt reports abdominal pain x 2 days. Pt has history of cirrhosis and he said he can't remember when his last paracentesis was but he knows it was a while ago. Pt states the swelling has gotten really bad over the past two days. Pt also c/o n/v. Pt states he drank 2 beers today. Pt states he typically drinks a six pack a day which is down from a 12 pack.  Pt has ascites, pt also has yellowing of eyes and skin.

## 2019-07-29 ENCOUNTER — Inpatient Hospital Stay: Payer: Medicaid Other

## 2019-07-29 DIAGNOSIS — K7031 Alcoholic cirrhosis of liver with ascites: Principal | ICD-10-CM

## 2019-07-29 DIAGNOSIS — E871 Hypo-osmolality and hyponatremia: Secondary | ICD-10-CM

## 2019-07-29 LAB — COMPREHENSIVE METABOLIC PANEL
ALT: 28 U/L (ref 0–44)
AST: 79 U/L — ABNORMAL HIGH (ref 15–41)
Albumin: 2.5 g/dL — ABNORMAL LOW (ref 3.5–5.0)
Alkaline Phosphatase: 119 U/L (ref 38–126)
Anion gap: 7 (ref 5–15)
BUN: 10 mg/dL (ref 6–20)
CO2: 22 mmol/L (ref 22–32)
Calcium: 8.1 mg/dL — ABNORMAL LOW (ref 8.9–10.3)
Chloride: 95 mmol/L — ABNORMAL LOW (ref 98–111)
Creatinine, Ser: 0.44 mg/dL — ABNORMAL LOW (ref 0.61–1.24)
GFR calc Af Amer: >60 mL/min (ref >60)
GFR calc non Af Amer: >60 mL/min (ref >60)
Glucose, Bld: 120 mg/dL — ABNORMAL HIGH (ref 70–99)
Potassium: 4.1 mmol/L (ref 3.5–5.1)
Sodium: 124 mmol/L — ABNORMAL LOW (ref 135–145)
Total Bilirubin: 8.7 mg/dL — ABNORMAL HIGH (ref 0.3–1.2)
Total Protein: 7 g/dL (ref 6.5–8.1)

## 2019-07-29 LAB — PHOSPHORUS: Phosphorus: 3 mg/dL (ref 2.5–4.6)

## 2019-07-29 LAB — BODY FLUID CELL COUNT WITH DIFFERENTIAL
Eos, Fluid: 0 %
Lymphs, Fluid: 1 %
Monocyte-Macrophage-Serous Fluid: 36 %
Neutrophil Count, Fluid: 63 %
Total Nucleated Cell Count, Fluid: 1293 cu mm

## 2019-07-29 LAB — CBC
HCT: 30.3 % — ABNORMAL LOW (ref 39.0–52.0)
Hemoglobin: 11.3 g/dL — ABNORMAL LOW (ref 13.0–17.0)
MCH: 38 pg — ABNORMAL HIGH (ref 26.0–34.0)
MCHC: 37.3 g/dL — ABNORMAL HIGH (ref 30.0–36.0)
MCV: 102 fL — ABNORMAL HIGH (ref 80.0–100.0)
Platelets: 56 10*3/uL — ABNORMAL LOW (ref 150–400)
RBC: 2.97 MIL/uL — ABNORMAL LOW (ref 4.22–5.81)
RDW: 15.4 % (ref 11.5–15.5)
WBC: 6.6 10*3/uL (ref 4.0–10.5)
nRBC: 0 % (ref 0.0–0.2)

## 2019-07-29 LAB — SARS CORONAVIRUS 2 (TAT 6-24 HRS): SARS Coronavirus 2: NEGATIVE

## 2019-07-29 LAB — LACTATE DEHYDROGENASE, PLEURAL OR PERITONEAL FLUID: LD, Fluid: 40 U/L — ABNORMAL HIGH (ref 3–23)

## 2019-07-29 LAB — MAGNESIUM: Magnesium: 1.8 mg/dL (ref 1.7–2.4)

## 2019-07-29 LAB — PROTEIN, PLEURAL OR PERITONEAL FLUID: Total protein, fluid: <3 g/dL

## 2019-07-29 LAB — LACTATE DEHYDROGENASE: LDH: 177 U/L (ref 98–192)

## 2019-07-29 NOTE — ED Notes (Signed)
Dr. Posey Pronto aware that patient wants to leave AMA, states okay for patient to leave AMA. Will instruct patient to call for a ride.

## 2019-07-29 NOTE — Discharge Summary (Signed)
  Called by RN in the ER patient wants to go home. He got ultrasound paracentesis with removal about 7 L of fluid. Patient has been in and out of the hospital with recurrent ascites secondary to cirrhosis of liver. He has been noncompliant with outpatient follow-up with G.I. and or PCP. He has been having issues with medication compliance as well. Has history of alcohol abuse. Patient was not seen by me during the ER visit.   Time spent less than 30 minutes

## 2019-07-29 NOTE — Progress Notes (Signed)
1PT Cancellation Note  Patient Details Name: Frederick Cabrera MRN: [id number] DOB: 1965-11-10   Cancelled Treatment:    Reason Eval/Treat Not Completed: Patient at procedure or test/unavailable Attempted to see pt, he was out of room for procedure.  Spoke with nursing who reports they have been able to do some walking with PT, apparently weak but per pt not too far from baseline.  Apparently he is eager to go home (AMA?); wiill attempt to see pt later as time allows he he is here and is appropriate.  Kreg Shropshire, DPT 07/29/2019, 10:53 AM

## 2019-07-29 NOTE — ED Notes (Signed)
Pt transported to [place] for US paracentesis at this time.

## 2019-07-29 NOTE — ED Notes (Signed)
Pt returned from [place] at this time. Pt is alert, requesting bad, states "I'm going home". This RN requesting patient to wait and see admitting MD. Will notify MD of patient wanting to leave.

## 2019-07-29 NOTE — ED Notes (Signed)
Dr. Posey Pronto aware of patient leaving AMA. Pt states he wants to go outside and call his sister-in-law. This RN encouraged patient to wait for his ride in room, pt refused. Pt ambulatory to the lobby. This Rn reviwed with patient that leaving AMA could lead to worsening condition or even death. Pt states understanding, states he wants to go home and get some sleep. Per Dr. Posey Pronto, pt okay to leave AMA at this time. Pt ambulatory with a steady gait, NAD noted, VSS. [place] gave patient instructions of what to be aware of due to recent US paracentesis and return precautions prior to leaving.

## 2019-07-29 NOTE — ED Notes (Signed)
This RN to bedside at this time, introduced self to patient. Labs collected by this RN. Pt assisted to the bathroom by this RN. Pt asking about when he will go for US paracentesis, this RN apologized for delay. Pt states understanding.

## 2019-07-29 NOTE — Progress Notes (Signed)
OT Cancellation Note  Patient Details Name: Frederick Cabrera MRN: [id number] DOB: 29-Nov-1965   Cancelled Treatment:    Reason Eval/Treat Not Completed: Other (comment). Consult received, chart reviewed. Pt in ED. Will hold OT evaluation at this time and re-attempt at later date/time as pt is medically appropriate and once pt has transferred up on the unit.   Jeni Salles, MPH, MS, OTR/L ascom [phone] 07/29/19, 8:08 AM

## 2019-07-30 LAB — PROTEIN, BODY FLUID (OTHER): Total Protein, Body Fluid Other: 1 g/dL

## 2019-07-30 LAB — CYTOLOGY - NON PAP

## 2019-08-01 LAB — BODY FLUID CULTURE: Culture: NO GROWTH

## 2019-08-11 ENCOUNTER — Other Ambulatory Visit
Admission: RE | Admit: 2019-08-11 | Discharge: 2019-08-11 | Disposition: A | Discharge status: Home / Self Care | Payer: Medicaid Other | Source: Home / Self Care | Attending: Gastroenterology | Admitting: Gastroenterology

## 2019-08-11 ENCOUNTER — Encounter: Payer: Self-pay | Admitting: Gastroenterology

## 2019-08-11 ENCOUNTER — Ambulatory Visit: Payer: Medicaid Other | Admitting: Gastroenterology

## 2019-08-11 ENCOUNTER — Ambulatory Visit
Admission: RE | Admit: 2019-08-11 | Discharge: 2019-08-11 | Disposition: A | Discharge status: Home / Self Care | Payer: Medicaid Other | Source: Ambulatory Visit | Attending: Gastroenterology | Admitting: Gastroenterology

## 2019-08-11 ENCOUNTER — Other Ambulatory Visit: Payer: Self-pay

## 2019-08-11 ENCOUNTER — Telehealth: Payer: Self-pay | Admitting: Gastroenterology

## 2019-08-11 VITALS — BP 138/86 | HR 103 | Temp 98.4°F | Ht 68.0 in | Wt 200.6 lb

## 2019-08-11 DIAGNOSIS — F1010 Alcohol abuse, uncomplicated: Secondary | ICD-10-CM | POA: Diagnosis not present

## 2019-08-11 DIAGNOSIS — K7290 Hepatic failure, unspecified without coma: Secondary | ICD-10-CM | POA: Diagnosis not present

## 2019-08-11 DIAGNOSIS — K7031 Alcoholic cirrhosis of liver with ascites: Secondary | ICD-10-CM

## 2019-08-11 DIAGNOSIS — K7682 Hepatic encephalopathy: Secondary | ICD-10-CM

## 2019-08-11 DIAGNOSIS — M6284 Sarcopenia: Secondary | ICD-10-CM

## 2019-08-11 DIAGNOSIS — K5900 Constipation, unspecified: Secondary | ICD-10-CM

## 2019-08-11 LAB — BODY FLUID CELL COUNT WITH DIFFERENTIAL
Eos, Fluid: 0 %
Lymphs, Fluid: 45 %
Monocyte-Macrophage-Serous Fluid: 50 %
Neutrophil Count, Fluid: 5 %
Total Nucleated Cell Count, Fluid: 146 cu mm

## 2019-08-11 LAB — ALBUMIN, PLEURAL OR PERITONEAL FLUID: Albumin, Fluid: <1 g/dL

## 2019-08-11 LAB — PROTEIN, PLEURAL OR PERITONEAL FLUID: Total protein, fluid: <3 g/dL

## 2019-08-11 MED ORDER — ALBUMIN HUMAN 25 % IV SOLN: 25.0000 g | Freq: Once | INTRAVENOUS | Status: DC

## 2019-08-11 MED ORDER — ALBUMIN HUMAN 25 % IV SOLN: INTRAVENOUS | 0 refills | Status: DC

## 2019-08-11 MED ORDER — LACTULOSE 10 GM/15ML PO SOLN: 10.0000 g | Freq: Three times a day (TID) | ORAL | 2 refills | Status: DC

## 2019-08-11 MED ORDER — ALBUMIN HUMAN 25 % IV SOLN
INTRAVENOUS | Status: AC
  Filled 2019-08-11: qty 200

## 2019-08-11 NOTE — Telephone Encounter (Signed)
Spoke with pt sister in law and informed her of Dr. Georgeann Oppenheim suggestion for pt to proceed to the ED.

## 2019-08-11 NOTE — Progress Notes (Signed)
Frederick Bellows MD, MRCP(U.K) [address]  [place]  [place], [place] [address]  Main: [phone]  Fax: [phone]   Primary Care Physician: [place]  Primary Gastroenterologist:  Dr. Harland German follow-up  HPI: Frederick Cabrera is a 53 y.o. male    Summary of history :  He is a patient who was seen by myself in May 2020 when admitted with decompensated cirrhosis.  He carries a diagnosis of alcoholic liver cirrhosis with ascites and nonbleeding esophageal varices.  Has been seen by Dr. Alice Reichert in the past.  Plan at discharge when admitted in May 2020 was to follow-up within a week. Since May 2020 I see multiple hospital admissions for decompensated liver cirrhosis with ascites.  Last inpatient note for for the patient wanted to go home and has not been compliant with GI or PCP follow-up. He discharged AMA.  The peritoneal fluid which was tapped on 07/29/2019 meets criteria for SBP but no growth was seen on the culture. On 07/29/2019 hemoglobin 11.3 g with an MCV of 102 and a platelet count of 56.  Sodium 124 albumin 2.5 AST 79 ALT 28 total bilirubin 8.6.  INR 1.5.   Today is here with his sister.  He continues to drink alcohol.  He wishes to stop but has not sought any help.  He has not seen his primary care physician yet but has an appointment in December.  He takes 40 mg of Lasix and 100 mg of Aldactone a day.  Has a bit of confusion weak on his feet, abdominal distention pain from the distention.  Has a bowel movement not every day. Current Outpatient Medications  Medication Sig Dispense Refill   folic acid (FOLVITE) 1 MG tablet Take 1 tablet (1 mg total) by mouth daily. (Patient not taking: Reported on 07/08/2019) 30 tablet 0   furosemide (LASIX) 40 MG tablet Take 1 tablet (40 mg total) by mouth daily. 30 tablet 1   lactulose (CHRONULAC) 10 GM/15ML solution Take 30 mLs (20 g total) by mouth 3 (three) times daily. 236 mL 1   Multiple Vitamin  (MULTIVITAMIN WITH MINERALS) TABS tablet Take 1 tablet by mouth daily. 30 tablet 1   pantoprazole (PROTONIX) 40 MG tablet Take 1 tablet (40 mg total) by mouth 2 (two) times daily before a meal. 60 tablet 2   propranolol (INDERAL) 20 MG tablet Take 1 tablet (20 mg total) by mouth 2 (two) times daily. 60 tablet 1   spironolactone (ALDACTONE) 100 MG tablet Take 1 tablet (100 mg total) by mouth daily. 30 tablet 1   No current facility-administered medications for this visit.     Allergies as of 08/11/2019 - Review Complete 07/28/2019  Allergen Reaction Noted   Wool alcohol [lanolin] Rash 02/05/2019    ROS:  General: Negative for anorexia, weight loss, fever, chills, fatigue, weakness. ENT: Negative for hoarseness, difficulty swallowing , nasal congestion. CV: Negative for chest pain, angina, palpitations, dyspnea on exertion, peripheral edema.  Respiratory: Negative for dyspnea at rest, dyspnea on exertion, cough, sputum, wheezing.  GI: See history of present illness. GU:  Negative for dysuria, hematuria, urinary incontinence, urinary frequency, nocturnal urination.  Endo: Negative for unusual weight change.    Physical Examination:   There were no vitals taken for this visit.  General: Very thin cachectic in a wheelchair poor muscle mass Eyes: No icterus. Conjunctivae pink. Mouth: Oropharyngeal mucosa moist and pink , no lesions erythema or exudate. Lungs: Spider angiomas over chest  clear to auscultation bilaterally. Non-labored. Heart: Regular rate and rhythm, no murmurs rubs or gallops.  Abdomen: Grossly distended, everted umbilicus, tense ascites, no tenderness. Extremities: No lower extremity edema. No clubbing or deformities. Neuro: Alert and oriented x 3.  Grossly intact. Skin: Warm and dry, no jaundice.   Psych: Alert and cooperative, normal mood and affect.   Imaging Studies: Ct Head Wo Contrast  Result Date: 07/20/2019 CLINICAL DATA:  Altered mental status EXAM:  CT HEAD WITHOUT CONTRAST CT CERVICAL SPINE WITHOUT CONTRAST TECHNIQUE: Multidetector CT imaging of the head and cervical spine was performed following the standard protocol without intravenous contrast. Multiplanar CT image reconstructions of the cervical spine were also generated. COMPARISON:  None. FINDINGS: CT HEAD FINDINGS Brain: Mild atrophic changes are noted. No findings to suggest acute hemorrhage, acute infarction or space-occupying mass lesion are noted. Vascular: No hyperdense vessel or unexpected calcification. Skull: Normal. Negative for fracture or focal lesion. Sinuses/Orbits: Air-fluid level is noted within the sphenoid sinus on the right. No other specific sinus abnormality is noted Other: None. CT CERVICAL SPINE FINDINGS Alignment: Normal. Skull base and vertebrae: 7 cervical segments are well visualized. Vertebral body height is well maintained. No acute fracture or acute facet abnormality is noted. Soft tissues and spinal canal: Surrounding soft tissues are within normal limits. Upper chest: Visualized lung apices are within normal limits. Other: None IMPRESSION: CT of the head: Chronic atrophic changes without acute abnormality. Air-fluid level within the right sphenoid sinus of uncertain chronicity. CT of the cervical spine: No acute abnormality noted. Electronically Signed   By: Alcide Clever M.D.   On: 07/20/2019 23:17   Ct Cervical Spine Wo Contrast  Result Date: 07/20/2019 CLINICAL DATA:  Altered mental status EXAM: CT HEAD WITHOUT CONTRAST CT CERVICAL SPINE WITHOUT CONTRAST TECHNIQUE: Multidetector CT imaging of the head and cervical spine was performed following the standard protocol without intravenous contrast. Multiplanar CT image reconstructions of the cervical spine were also generated. COMPARISON:  None. FINDINGS: CT HEAD FINDINGS Brain: Mild atrophic changes are noted. No findings to suggest acute hemorrhage, acute infarction or space-occupying mass lesion are noted. Vascular:  No hyperdense vessel or unexpected calcification. Skull: Normal. Negative for fracture or focal lesion. Sinuses/Orbits: Air-fluid level is noted within the sphenoid sinus on the right. No other specific sinus abnormality is noted Other: None. CT CERVICAL SPINE FINDINGS Alignment: Normal. Skull base and vertebrae: 7 cervical segments are well visualized. Vertebral body height is well maintained. No acute fracture or acute facet abnormality is noted. Soft tissues and spinal canal: Surrounding soft tissues are within normal limits. Upper chest: Visualized lung apices are within normal limits. Other: None IMPRESSION: CT of the head: Chronic atrophic changes without acute abnormality. Air-fluid level within the right sphenoid sinus of uncertain chronicity. CT of the cervical spine: No acute abnormality noted. Electronically Signed   By: Alcide Clever M.D.   On: 07/20/2019 23:17   US Abdomen Complete  Result Date: 07/20/2019 CLINICAL DATA:  Abdominal pain and elevated lipase EXAM: ABDOMEN ULTRASOUND COMPLETE COMPARISON:  01/19/2019 FINDINGS: Gallbladder: Gallbladder is well distended with multiple gallbladder sludge. The wall is mildly thickened at 4 mm although likely related to the underlying ascites. Common bile duct: Diameter: 5.6 mm Liver: Cirrhotic change of the liver is noted. No focal mass is seen. Portal vein is patent on color Doppler imaging with reversed direction of blood flow away from the liver. IVC: Not well visualized Pancreas: Not well visualized Spleen: Size and appearance within normal limits. Right  Kidney: Length: 11.1 cm. Echogenicity within normal limits. No mass or hydronephrosis visualized. Left Kidney: Length: 12.0 cm. Echogenicity within normal limits. No mass or hydronephrosis visualized. Abdominal aorta: No aneurysm visualized. Other findings: Ascites is noted throughout the abdomen. IMPRESSION: Changes consistent with cirrhosis of the liver with ascites. Some compensatory gallbladder wall  thickening is noted. Gallbladder sludge. The pancreas is not well visualized. Electronically Signed   By: Mark  Lukens M.D.   On: 07/20/2019 23:06   [place] Paracentesis  Result Date: 07/29/2019 INDICATION: 53 year old malAlcide Clevere with alcoholic cirrhosis and recurrent large volume symptomatic ascites. EXAM: ULTRASOUND GUIDED  PARACENTESIS MEDICATIONS: None. COMPLICATIONS: None immediate. PROCEDURE: Informed written consent was obtained from the patient after a discussion of the risks, benefits and alternatives to treatment. A timeout was performed prior to the initiation of the procedure. Initial ultrasound scanning demonstrates a large amount of ascites within the right lower abdominal quadrant. The right lower abdomen was prepped and draped in the usual sterile fashion. 1% lidocaine with epinephrine was used for local anesthesia. Following this, a 6 Fr Safe-T-Centesis catheter was introduced. An ultrasound image was saved for documentation purposes. The paracentesis was performed. The catheter was removed and a dressing was applied. The patient tolerated the procedure well without immediate post procedural complication. FINDINGS: A total of approximately 7000 mL of golden ascitic fluid was removed. IMPRESSION: Successful ultrasound-guided paracentesis yielding 7 liters of peritoneal fluid. Electronically Signed   By: Malachy MoanHeath  McCullough M.D.   On: 07/29/2019 10:54   [place] Paracentesis  Result Date: 07/21/2019 INDICATION: 53 year old male with a history of recurrent ascites EXAM: ULTRASOUND GUIDED  PARACENTESIS MEDICATIONS: None. COMPLICATIONS: None PROCEDURE: Informed written consent was obtained from the patient after a discussion of the risks, benefits and alternatives to treatment. A timeout was performed prior to the initiation of the procedure. Initial ultrasound scanning demonstrates a large amount of ascites within the right lower abdominal quadrant. The right lower abdomen was prepped and draped in the usual  sterile fashion. 1% lidocaine was used for local anesthesia. Following this, a 8 Fr Safe-T-Centesis catheter was introduced. An ultrasound image was saved for documentation purposes. The paracentesis was performed. The catheter was removed and a dressing was applied. The patient tolerated the procedure well without immediate post procedural complication. Patient received post-procedure intravenous albumin; see nursing notes for details. FINDINGS: A total of approximately 7.6 L of yellow fluid was removed. IMPRESSION: Status post ultrasound-guided paracentesis. Signed, Yvone NeuJaime S. Reyne DumasWagner, DO, RPVI [facility] Electronically Signed   By: Gilmer MorJaime  Wagner D.O.   On: 07/21/2019 10:56   Dg Chest Portable 1 View  Result Date: 07/28/2019 CLINICAL DATA:  Shortness of breath EXAM: PORTABLE CHEST 1 VIEW COMPARISON:  07/20/2019 FINDINGS: Diffuse interstitial pulmonary opacity and pulmonary vascular prominence similar to prior examination. No new or focal airspace opacity. Unchanged cardiomegaly. IMPRESSION: 1. Diffuse interstitial pulmonary opacity and pulmonary vascular prominence similar to prior examination. Findings are most consistent with edema. No new or focal airspace opacity. 2.  Unchanged cardiomegaly. Electronically Signed   By: Lauralyn PrimesAlex  Bibbey M.D.   On: 07/28/2019 16:23   Dg Chest Portable 1 View  Result Date: 07/20/2019 CLINICAL DATA:  Shortness of breath EXAM: PORTABLE CHEST 1 VIEW COMPARISON:  July 17, 2019 FINDINGS: The mediastinal contour is normal. Heart size is enlarged. There is pulmonary edema. There is no pleural effusion or focal pneumonia. The bony structures are stable. IMPRESSION: Cardiomegaly and pulmonary edema. Electronically Signed   By: Scherry RanWei-Chen  Juel Burrow M.D.   On: 07/20/2019 21:41   Dg Chest Portable 1 View  Result Date: 07/17/2019 CLINICAL DATA:  Dyspnea and swollen abdomen EXAM: PORTABLE CHEST 1 VIEW COMPARISON:  Chest radiograph  dated 03/08/2019 FINDINGS: The heart remains enlarged. There is mild diffuse bilateral interstitial opacities, unchanged. There is no pleural effusion or pneumothorax. The osseous structures are intact. IMPRESSION: 1. Mild diffuse interstitial opacities are not significantly changed and may represent pulmonary edema or underlying chronic interstitial lung disease. 2. Unchanged cardiomegaly. Electronically Signed   By: Romona Curls M.D.   On: 07/17/2019 11:59    Assessment and Plan:   Frederick Cabrera is a 53 y.o. y/o male with a history of decompensated alcoholic liver cirrhosis with ascites, nonbleeding grade 2 esophageal varices on medical prophylaxis, sarcopenia, ongoing alcohol consumption, tense ascites multiple admissions to the hospital for ascites, noncompliance, discharged AGAINST MEDICAL ADVICE is here the office to transfer care to [place].  Long discussion with him that he has already switched a provider and he has had a history of noncompliance, strongly reiterated that he needs to be very compliant with my advice and recommendations otherwise he will be dismissed from the clinic.  Plan 1.  Check labs including CBC, CMP, PT/INR: We will increase dose of Aldactone and Lasix once I receive his lab results. 2.  Complete autoimmune and viral hepatitis work-up 3.  Right upper quadrant ultrasound to screen for HCC 4.  Low-salt diet 5.   He needs to follow-up with his PCP, strongly stressed that noncompliance with follow-up and recommendations will lead to dismissal from clinic. 6.  EGD to screen for esophageal varices in April 2021 7.  Stop alcohol consumption. 8.  Diagnostic and therapeutic abdominal paracentesis as there was evidence of SBP on his last tap of his ascites fluid.  I do not see any evidence that he was treated for the same. 9.  Commence on lactulose for minimal hepatic encephalopathy 15 cc 3 times daily 10.  Advised him to seek help from alcoholic anonymous. 11.  Informed him of  the high mortality with ongoing alcohol use, strongly suggested him to stop, discussed with him palliative care options.  Dr Wyline Mood  MD,MRCP [facility]) Follow up in 7-10 days

## 2019-08-11 NOTE — Progress Notes (Signed)
Patient coming today for paracentesis. Lab  Evidently stuck 8 times attempting to get labwork prior to para, however not successful. I attempted 3 times to get iv for albumin infusion with vein finder but not successful, patient refusing further iv attempts,and refusing albumin at this time. Thus notified Dr Vernard Gambles. Patient going home, states he will come back tomorrow and try to get labs drawn,.

## 2019-08-11 NOTE — Telephone Encounter (Signed)
Pt sister in law Rita left vm she went to [employer] and they were unable to get his Labs she would l;ike top take him to ER he is not feeling well he is cold please call for advice

## 2019-08-11 NOTE — Telephone Encounter (Signed)
Frederick Cabrera from [facility] lab  Left vm stating she was unable to get pt lab  Any questions her number is [phone]

## 2019-08-12 ENCOUNTER — Telehealth: Payer: Self-pay | Admitting: Gastroenterology

## 2019-08-12 LAB — PROTEIN, BODY FLUID (OTHER): Total Protein, Body Fluid Other: 0.9 g/dL

## 2019-08-12 NOTE — Telephone Encounter (Signed)
Frederick Cabrera from [facility] left vm pt was unable to have Labs drawn and was told to rehydrate he will come this morning to have Labs drawn Frederick Cabrera would like a call at  cb  [phone]

## 2019-08-12 NOTE — Telephone Encounter (Signed)
Returned called to [employer] in [place] and was told she's not in today. [facility] tech I spoke with states she'll have Frederick Cabrera give [place] a call when she returns.

## 2019-08-13 LAB — CYTOLOGY - NON PAP

## 2019-08-15 LAB — BODY FLUID CULTURE: Culture: NO GROWTH

## 2019-08-16 ENCOUNTER — Ambulatory Visit: Admission: RE | Admit: 2019-08-16 | Payer: Medicaid Other | Source: Ambulatory Visit

## 2019-08-20 ENCOUNTER — Encounter: Payer: Self-pay | Admitting: Emergency Medicine

## 2019-08-20 ENCOUNTER — Other Ambulatory Visit: Payer: Self-pay

## 2019-08-20 ENCOUNTER — Emergency Department
Admission: EM | Admit: 2019-08-20 | Discharge: 2019-08-20 | Discharge status: Against Medical Advice | Payer: Medicaid Other | Attending: Emergency Medicine | Admitting: Emergency Medicine

## 2019-08-20 DIAGNOSIS — Z79899 Other long term (current) drug therapy: Secondary | ICD-10-CM | POA: Insufficient documentation

## 2019-08-20 DIAGNOSIS — F17210 Nicotine dependence, cigarettes, uncomplicated: Secondary | ICD-10-CM | POA: Insufficient documentation

## 2019-08-20 DIAGNOSIS — R109 Unspecified abdominal pain: Secondary | ICD-10-CM | POA: Insufficient documentation

## 2019-08-20 DIAGNOSIS — Z5320 Procedure and treatment not carried out because of patient's decision for unspecified reasons: Secondary | ICD-10-CM | POA: Diagnosis not present

## 2019-08-20 DIAGNOSIS — R188 Other ascites: Secondary | ICD-10-CM | POA: Insufficient documentation

## 2019-08-20 LAB — COMPREHENSIVE METABOLIC PANEL
ALT: 27 U/L (ref 0–44)
AST: 64 U/L — ABNORMAL HIGH (ref 15–41)
Albumin: 2.2 g/dL — ABNORMAL LOW (ref 3.5–5.0)
Alkaline Phosphatase: 146 U/L — ABNORMAL HIGH (ref 38–126)
Anion gap: 12 (ref 5–15)
BUN: 12 mg/dL (ref 6–20)
CO2: 18 mmol/L — ABNORMAL LOW (ref 22–32)
Calcium: 8.3 mg/dL — ABNORMAL LOW (ref 8.9–10.3)
Chloride: 92 mmol/L — ABNORMAL LOW (ref 98–111)
Creatinine, Ser: 0.76 mg/dL (ref 0.61–1.24)
GFR calc Af Amer: >60 mL/min (ref >60)
GFR calc non Af Amer: >60 mL/min (ref >60)
Glucose, Bld: 170 mg/dL — ABNORMAL HIGH (ref 70–99)
Potassium: 4.1 mmol/L (ref 3.5–5.1)
Sodium: 122 mmol/L — ABNORMAL LOW (ref 135–145)
Total Bilirubin: 5.2 mg/dL — ABNORMAL HIGH (ref 0.3–1.2)
Total Protein: 7.2 g/dL (ref 6.5–8.1)

## 2019-08-20 LAB — URINALYSIS, COMPLETE (UACMP) WITH MICROSCOPIC
Bacteria, UA: NONE SEEN
Bilirubin Urine: NEGATIVE
Glucose, UA: NEGATIVE mg/dL
Hgb urine dipstick: NEGATIVE
Ketones, ur: NEGATIVE mg/dL
Leukocytes,Ua: NEGATIVE
Nitrite: NEGATIVE
Protein, ur: NEGATIVE mg/dL
Specific Gravity, Urine: 1.025 (ref 1.005–1.030)
pH: 5 (ref 5.0–8.0)

## 2019-08-20 LAB — BODY FLUID CELL COUNT WITH DIFFERENTIAL
Eos, Fluid: 0 %
Lymphs, Fluid: 59 %
Monocyte-Macrophage-Serous Fluid: 39 %
Neutrophil Count, Fluid: 2 %
Total Nucleated Cell Count, Fluid: 103 cu mm

## 2019-08-20 LAB — CBC
HCT: 33.7 % — ABNORMAL LOW (ref 39.0–52.0)
Hemoglobin: 12.2 g/dL — ABNORMAL LOW (ref 13.0–17.0)
MCH: 35.6 pg — ABNORMAL HIGH (ref 26.0–34.0)
MCHC: 36.2 g/dL — ABNORMAL HIGH (ref 30.0–36.0)
MCV: 98.3 fL (ref 80.0–100.0)
Platelets: 93 10*3/uL — ABNORMAL LOW (ref 150–400)
RBC: 3.43 MIL/uL — ABNORMAL LOW (ref 4.22–5.81)
RDW: 14.2 % (ref 11.5–15.5)
WBC: 8.4 10*3/uL (ref 4.0–10.5)
nRBC: 0 % (ref 0.0–0.2)

## 2019-08-20 LAB — LIPASE, BLOOD: Lipase: 96 U/L — ABNORMAL HIGH (ref 11–51)

## 2019-08-20 MED ORDER — LIDOCAINE HCL (PF) 1 % IJ SOLN
INTRAMUSCULAR | Status: AC
  Administered 2019-08-20: 17:00:00
  Filled 2019-08-20: qty 5

## 2019-08-20 MED ORDER — SODIUM CHLORIDE 0.9% FLUSH: 3.0000 mL | Freq: Once | INTRAVENOUS | Status: DC

## 2019-08-20 NOTE — ED Notes (Signed)
Pt given phone and relative [name]'s number from [employer] as he needs ride home.

## 2019-08-20 NOTE — ED Notes (Signed)
3 prior attempts for IV/lab draw unsuccessful

## 2019-08-20 NOTE — ED Notes (Signed)
Pt adamant he wants to leave before all lab results back. Educated pt about risks. Pt still wants to leave. EDP Archie Balboa notified.

## 2019-08-20 NOTE — ED Notes (Addendum)
Pt reports history of multiple paracentesis/draining procedures. History of cirrhosis and ascites. White of pt's eyes jaundiced.

## 2019-08-20 NOTE — ED Triage Notes (Signed)
Pt here with c/o abd pain and swelling for the past few days, hx of cirrhosis, distended abd noted. States he has had to get his abd drained in the past. NAD.

## 2019-08-20 NOTE — ED Provider Notes (Signed)
[facility] Emergency Department Provider Note   ____________________________________________   I have reviewed the triage vital signs and the nursing notes.   HISTORY  Chief Complaint Abdominal Pain   History limited by: Not Limited   HPI Frederick Cabrera is a 53 y.o. male who presents to the emergency department today because of concerns for abdominal pain and distention.  Patient states he has a history of cirrhosis and has required paracentesis is in the past.  He states that the pain started yesterday.  Became progressively worse throughout the day today.  Is located throughout his abdomen.  He denies any shortness of breath.  He denies any fevers.   Records reviewed. Per medical record review patient has a history of cirrhosis with ascites. Had last paracentesis 9 days ago.  Past Medical History:  Diagnosis Date  . Alcoholic cirrhosis of liver with ascites [facility])     Patient Active Problem List   Diagnosis Date Noted  . Metabolic acidosis 13/04/6577  . Macrocytosis 07/28/2019  . Thrombocytopenia ([place]) 07/28/2019  . Hyperbilirubinemia 07/28/2019  . 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 Hyponatremia 07/20/2019  . Ascites 07/08/2019  . Hypoxia 03/08/2019  . Respiratory distress 03/08/2019  . Abdominal pain 02/14/2019  . Ascites due to alcoholic cirrhosis ([place]) 84/13/2440  . Alcoholic cirrhosis of liver with ascites ([place])   . Goals of care, counseling/discussion   . Palliative care by specialist   . Hematemesis 01/17/2019    Past Surgical History:  Procedure Laterality Date  . ESOPHAGOGASTRODUODENOSCOPY (EGD) WITH PROPOFOL N/A 01/17/2019   Procedure: ESOPHAGOGASTRODUODENOSCOPY (EGD) WITH PROPOFOL;  Surgeon: Toledo, Benay Pike, MD;  Location: [facility] ENDOSCOPY;  Service: Gastroenterology;  Laterality: N/A;    Prior to Admission medications   Medication Sig Start Date End Date Taking? Authorizing Provider  albumin human 25 % bottle Inject 25 g  albumin per 4 liters removed; max 50 g 08/11/19   Jonathon Bellows, MD  folic acid (FOLVITE) 1 MG tablet Take 1 tablet (1 mg total) by mouth daily. Patient not taking: Reported on 07/08/2019 02/17/19   Lang Snow, NP  furosemide (LASIX) 40 MG tablet Take 1 tablet (40 mg total) by mouth daily. 07/21/19 07/20/20  Fritzi Mandes, MD  lactulose (CHRONULAC) 10 GM/15ML solution Take 15 mLs (10 g total) by mouth 3 (three) times daily. 08/11/19 11/09/19  Jonathon Bellows, MD  Multiple Vitamin (MULTIVITAMIN WITH MINERALS) TABS tablet Take 1 tablet by mouth daily. Patient not taking: Reported on 08/11/2019 07/21/19   Fritzi Mandes, MD  pantoprazole (PROTONIX) 40 MG tablet Take 1 tablet (40 mg total) by mouth 2 (two) times daily before a meal. 07/21/19   Fritzi Mandes, MD  propranolol (INDERAL) 20 MG tablet Take 1 tablet (20 mg total) by mouth 2 (two) times daily. Patient not taking: Reported on 08/11/2019 07/21/19   Fritzi Mandes, MD  spironolactone (ALDACTONE) 100 MG tablet Take 1 tablet (100 mg total) by mouth daily. 07/21/19   Fritzi Mandes, MD    Allergies Wool alcohol [lanolin]  Family History  Problem Relation Age of Onset  . Heart disease Father     Social History Social History   Tobacco Use  . Smoking status: Current Every Day Smoker    Packs/day: 0.50    Years: 20.00    Pack years: 10.00    Types: Cigarettes  . Smokeless tobacco: Never Used  Substance Use Topics  . Alcohol use: Yes    Comment: last drink 07/17/19  . Drug use: Not Currently  Review of Systems Constitutional: No fever/chills Eyes: No visual changes. ENT: No sore throat. Cardiovascular: Denies chest pain. Respiratory: Denies shortness of breath. Gastrointestinal: Positive for abdominal pain and distention.  Genitourinary: Negative for dysuria. Musculoskeletal: Negative for back pain. Skin: Negative for rash. Neurological: Negative for headaches, focal weakness or  numbness.  ____________________________________________   PHYSICAL EXAM:  VITAL SIGNS: ED Triage Vitals  Enc Vitals Group     BP 08/20/19 1153 134/75     Pulse Rate 08/20/19 1153 (!) 109     Resp 08/20/19 1153 18     Temp 08/20/19 1153 98.4 F (36.9 C)     Temp Source 08/20/19 1153 Oral     SpO2 08/20/19 1153 98 %     Weight 08/20/19 1138 225 lb (102.1 kg)     Height 08/20/19 1138 5' 5" (1.651 m)     Head Circumference --      Peak Flow --      Pain Score 08/20/19 1137 8   Constitutional: Alert and oriented.  Eyes: Conjunctivae are normal.  ENT      Head: Normocephalic and atraumatic.      Nose: No congestion/rhinnorhea.      Mouth/Throat: Mucous membranes are moist.      Neck: No stridor. Hematological/Lymphatic/Immunilogical: No cervical lymphadenopathy. Cardiovascular: Normal rate, regular rhythm.  No murmurs, rubs, or gallops. Respiratory: Normal respiratory effort without tachypnea nor retractions. Breath sounds are clear and equal bilaterally. No wheezes/rales/rhonchi. Gastrointestinal: Distended and taut abdomen. Diffusely tender to palpation.  Genitourinary: Deferred Musculoskeletal: Normal range of motion in all extremities. No lower extremity edema. Neurologic:  Normal speech and language. No gross focal neurologic deficits are appreciated.  Skin:  Skin is warm, dry and intact. No rash noted. Psychiatric: Mood and affect are normal. Speech and behavior are normal. Patient exhibits appropriate insight and judgment.  ____________________________________________    LABS (pertinent positives/negatives)  Lipase 96 CBC wbc 8.4, hgb 12.2, plt 93 CMP na 122, k 4.1, glu 170, cr 0.76, alk phos 146, t bili 5.2  ____________________________________________   EKG  I, Nance Pear, attending physician, personally viewed and interpreted this EKG  EKG Time: 1149 Rate: 112 Rhythm: sinus tachyardia Axis: left axis deviation Intervals: qtc 466 QRS: LAFB ST  changes: no st elevation Impression: abnormal ekg  ____________________________________________    RADIOLOGY  None  ____________________________________________   PROCEDURES  .Paracentesis  Date/Time: 08/20/2019 6:18 PM Performed by: Nance Pear, MD Authorized by: Nance Pear, MD   Consent:    Consent obtained:  Verbal   Consent given by:  Patient   Risks discussed:  Bleeding, bowel perforation, infection and pain   Alternatives discussed:  No treatment Pre-procedure details:    Procedure purpose:  Diagnostic   Preparation: Patient was prepped and draped in usual sterile fashion   Anesthesia (see MAR for exact dosages):    Anesthesia method:  Local infiltration   Local anesthetic:  Lidocaine 1% WITH epi and lidocaine 1% w/o epi Procedure details:    Needle gauge:  18   Ultrasound guidance: yes     Puncture site:  R lower quadrant   Fluid removed amount:  60cc   Fluid appearance:  Yellow and cloudy   Dressing:  4x4 sterile gauze Post-procedure details:    Patient tolerance of procedure:  Tolerated well, no immediate complications      ____________________________________________   INITIAL IMPRESSION / ASSESSMENT AND PLAN / ED COURSE  Pertinent labs & imaging results that were available during my care  of the patient were reviewed by me and considered in my medical decision making (see chart for details).   Patient presented to the emergency department today because of concern for abdominal pain and distention. Patient has a history of cirrhosis and has required paracentesis in the past. Patient denies any shortness of breath. On exam his abdomen is taut and diffusely tender. Did perform diagnostic paracentesis. Patient however did leave prior to results returning. Had discussed with patient prior to his departure concern for possible infection given pain.  _____________________________________   FINAL CLINICAL IMPRESSION(S) / ED DIAGNOSES  Final  diagnoses:  Abdominal pain, unspecified abdominal location  Other ascites     Note: This dictation was prepared with Dragon dictation. Any transcriptional errors that result from this process are unintentional     Nance Pear, MD 08/20/19 1820

## 2019-08-20 NOTE — ED Notes (Signed)
Pt attempting to provide urine sample. Remains in room 31 C-pod. Will switch back to 12H once pt done providing sample.

## 2019-08-20 NOTE — ED Notes (Signed)
This RN at bedside while EDP Goodman collecting fluid sample.

## 2019-08-20 NOTE — ED Notes (Signed)
Pt given food tray and drink with verbal okay from [place]. Pt repositioned per request.

## 2019-08-23 ENCOUNTER — Other Ambulatory Visit: Payer: Self-pay

## 2019-08-23 ENCOUNTER — Telehealth: Payer: Self-pay

## 2019-08-23 DIAGNOSIS — K7031 Alcoholic cirrhosis of liver with ascites: Secondary | ICD-10-CM

## 2019-08-23 LAB — PATHOLOGIST SMEAR REVIEW

## 2019-08-23 MED ORDER — ALBUMIN HUMAN 25 % IV SOLN: INTRAVENOUS | 0 refills | Status: DC

## 2019-08-23 NOTE — Telephone Encounter (Signed)
Yes go ahead

## 2019-08-23 NOTE — Telephone Encounter (Signed)
Pt sister in-law, Leonia Reeves, called to request an order for pt to have another paracentesis. She states pt went to the ED last Friday for an urgent paracentesis but was told he would need an order from Dr. Vicente Males. I explained that I will relay this information to Dr. Vicente Males for his recommendations.

## 2019-08-23 NOTE — Telephone Encounter (Signed)
Called pt sister in-law to inform her of pt paracentesis appointment information.  Unable to contact, left detailed VM with appt information

## 2019-08-24 ENCOUNTER — Other Ambulatory Visit: Payer: Self-pay

## 2019-08-24 ENCOUNTER — Ambulatory Visit
Admission: RE | Admit: 2019-08-24 | Discharge: 2019-08-24 | Disposition: A | Discharge status: Home / Self Care | Payer: Medicaid Other | Source: Ambulatory Visit | Attending: Gastroenterology | Admitting: Gastroenterology

## 2019-08-24 DIAGNOSIS — K7031 Alcoholic cirrhosis of liver with ascites: Secondary | ICD-10-CM | POA: Insufficient documentation

## 2019-08-24 LAB — BODY FLUID CULTURE: Culture: NO GROWTH

## 2019-08-24 MED ORDER — ALBUMIN HUMAN 25 % IV SOLN
INTRAVENOUS | Status: AC
  Administered 2019-08-24: 25 g via INTRAVENOUS
  Filled 2019-08-24: qty 200

## 2019-08-24 MED ORDER — ALBUMIN HUMAN 25 % IV SOLN
25.0000 g | Freq: Once | INTRAVENOUS | Status: AC
  Administered 2019-08-24: 16:00:00 25 g via INTRAVENOUS

## 2019-08-24 MED ORDER — ALBUMIN HUMAN 25 % IV SOLN
25.0000 g | Freq: Once | INTRAVENOUS | Status: AC
  Administered 2019-08-24: 25 g via INTRAVENOUS

## 2019-08-24 MED ORDER — SODIUM CHLORIDE FLUSH 0.9 % IV SOLN
INTRAVENOUS | Status: AC
  Filled 2019-08-24: qty 10

## 2019-08-24 NOTE — Telephone Encounter (Signed)
Spoke with pt sister in-law to confirm that she received my VM regarding pt paracentesis appointment. She confirmed and pt agrees to appointment.

## 2019-08-26 ENCOUNTER — Other Ambulatory Visit: Payer: Self-pay

## 2019-08-26 ENCOUNTER — Ambulatory Visit: Payer: Medicaid Other | Admitting: Gastroenterology

## 2019-08-26 ENCOUNTER — Encounter: Payer: Self-pay | Admitting: Gastroenterology

## 2019-08-26 VITALS — BP 123/82 | HR 98 | Temp 98.1°F | Ht 68.0 in | Wt 201.6 lb

## 2019-08-26 DIAGNOSIS — K7682 Hepatic encephalopathy: Secondary | ICD-10-CM

## 2019-08-26 DIAGNOSIS — K7031 Alcoholic cirrhosis of liver with ascites: Secondary | ICD-10-CM

## 2019-08-26 DIAGNOSIS — F1010 Alcohol abuse, uncomplicated: Secondary | ICD-10-CM

## 2019-08-26 DIAGNOSIS — K7290 Hepatic failure, unspecified without coma: Secondary | ICD-10-CM | POA: Diagnosis not present

## 2019-08-26 MED ORDER — RIFAXIMIN 550 MG PO TABS: 550.0000 mg | ORAL_TABLET | Freq: Two times a day (BID) | ORAL | 5 refills | Status: AC

## 2019-08-26 NOTE — Progress Notes (Signed)
[name] Delylah Stanczyk MD, MRCP(U.K) [address]  [address]  [place], [place] [address]  Main: [phone]  Fax: [phone]   Primary Care Physician: Olena LeatherwoodFarrug, Eugene D, FNP  Primary Gastroenterologist:  Dr. 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 male   Summary of history :  He is a patient who was seen by myself in May 2020 when admitted with decompensated cirrhosis.  He carries a diagnosis of alcoholic liver cirrhosis with ascites and nonbleeding esophageal varices.  Has been seen by Dr. Norma Fredricksonoledo in the past.  Since May 2020 I see multiple hospital admissions for decompensated liver cirrhosis with ascites.  Last inpatient note for for the patient wanted to go home and has not been compliant with GI or PCP follow-up.He discharged AMA.  The peritoneal fluid which was tapped on 07/29/2019 meets criteria for SBP but no growth was seen on the culture. On 07/29/2019 hemoglobin 11.3 g with an MCV of 102 and a platelet count of 56.  Sodium 124 albumin 2.5 AST 79 ALT 28 total bilirubin 8.6.  INR 1.5.  Interval history 08/11/2019- 08/26/2019  08/20/2019: ER visit for abdominal distention paracentesis found no evidence of SBP.  Hemoglobin 12.2 g with an MCV of 98.3.  Sodium 122, albumin 2.2, total bilirubin 5.2, alkaline phosphatase 146.  Creatinine 0.76  He called  the office for abdominal paracentesis.  Underwent large-volume paracentesis and 9 L taken out on 08/24/2019.  Continues to drink alcohol but he has cut down on the quantity.  He has established care with his primary care doctor.  Has 3 bowel movements a day.  Taking lactulose daily.  Today is here with his sister.  On Lasix 40 mg and Aldactone 100 mg a day.  He does have issues confusion.    Current Outpatient Medications  Medication Sig Dispense Refill   albumin human 25 % bottle Inject 25 g albumin per 4 liters removed; max 50 g 200 mL 0   folic acid (FOLVITE) 1 MG  tablet Take 1 tablet (1 mg total) by mouth daily. 30 tablet 0   furosemide (LASIX) 40 MG tablet Take 1 tablet (40 mg total) by mouth daily. 30 tablet 1   lactulose (CHRONULAC) 10 GM/15ML solution Take 15 mLs (10 g total) by mouth 3 (three) times daily. 1350 mL 2   Multiple Vitamin (MULTIVITAMIN WITH MINERALS) TABS tablet Take 1 tablet by mouth daily. 30 tablet 1   pantoprazole (PROTONIX) 40 MG tablet Take 1 tablet (40 mg total) by mouth 2 (two) times daily before a meal. 60 tablet 2   propranolol (INDERAL) 20 MG tablet Take 1 tablet (20 mg total) by mouth 2 (two) times daily. 60 tablet 1   spironolactone (ALDACTONE) 100 MG tablet Take 1 tablet (100 mg total) by mouth daily. 30 tablet 1   No current facility-administered medications for this visit.     Allergies as of 08/26/2019 - Review Complete 08/24/2019  Allergen Reaction Noted   Wool alcohol [lanolin] Rash 02/05/2019    ROS:  General: Negative for anorexia, weight loss, fever, chills, fatigue, weakness. ENT: Negative for hoarseness, difficulty swallowing , nasal congestion. CV: Negative for chest pain, angina, palpitations, dyspnea on exertion, peripheral edema.  Respiratory: Negative for dyspnea at rest, dyspnea on exertion, cough, sputum, wheezing.  GI: See history of present illness. GU:  Negative for dysuria, hematuria, urinary incontinence, urinary frequency, nocturnal urination.  Endo: Negative for unusual weight  change.    Physical Examination:   There were no vitals taken for this visit.  General: Appears thin and cachectic in a wheelchair. Eyes: No icterus. Conjunctivae pink. Lungs: Clear to auscultation bilaterally. Non-labored. Heart: Regular rate and rhythm, no murmurs rubs or gallops.  Abdomen: Markedly distended, no guarding no rigidity no tenderness.  Reducible large umbilical hernia.  Bowel sounds present.  Free fluid large quantity present. Extremities: No lower extremity edema. No clubbing or  deformities.  Decreased muscle mass. Neuro: Alert and oriented x 3.  Grossly intact. Skin: Warm and dry, no jaundice.   Psych: Alert and cooperative, normal mood and affect.   Imaging Studies: US Paracentesis  Result Date: 08/24/2019 INDICATION: Ascites. EXAM: ULTRASOUND GUIDED PARACENTESIS MEDICATIONS: None. COMPLICATIONS: None immediate. PROCEDURE: Informed written consent was obtained from the patient after a discussion of the risks, benefits and alternatives to treatment. A timeout was performed prior to the initiation of the procedure. Initial ultrasound scanning demonstrates a large amount of ascites within the right lower abdominal quadrant. The right lower abdomen was prepped and draped in the usual sterile fashion. 1% lidocaine was used for local anesthesia. Following this, a 6 French catheter was introduced. An ultrasound image was saved for documentation purposes. The paracentesis was performed. The catheter was removed and a dressing was applied. The patient tolerated the procedure well without immediate post procedural complication. Patient to receive post-procedure intravenous albumin; see nursing notes for details. FINDINGS: A total of approximately 9 L of cloudy yellow fluid was removed. IMPRESSION: Successful ultrasound-guided paracentesis yielding 9 liters of peritoneal fluid. Electronically Signed   By: Maisie Fus  Register   On: 08/24/2019 15:57   US Paracentesis  Result Date: 08/12/2019 INDICATION: Alcoholic cirrhosis, recurrent large volume symptomatic ascites EXAM: ULTRASOUND GUIDED  PARACENTESIS MEDICATIONS: Lidocaine 1% subcutaneous COMPLICATIONS: None immediate. PROCEDURE: Informed written consent was obtained from the patient after a discussion of the risks, benefits and alternatives to treatment. A timeout was performed prior to the initiation of the procedure. Initial ultrasound scanning demonstrates a large amount of ascites within the right lower abdominal quadrant. The right  lower abdomen was prepped and draped in the usual sterile fashion. 1% lidocaine was used for local anesthesia. Following this, a 6 Fr Safe-T-Centesis catheter was introduced. An ultrasound image was saved for documentation purposes. The paracentesis was performed. The catheter was removed and a dressing was applied. The patient tolerated the procedure well without immediate post procedural complication. FINDINGS: A total of approximately 7 L of clear yellow fluid was removed. IMPRESSION: Successful ultrasound-guided paracentesis yielding 7 liters of peritoneal fluid. Electronically Signed   By: Corlis Leak M.D.   On: 08/12/2019 08:58   US Paracentesis  Result Date: 07/29/2019 INDICATION: 53 year old male with alcoholic cirrhosis and recurrent large volume symptomatic ascites. EXAM: ULTRASOUND GUIDED  PARACENTESIS MEDICATIONS: None. COMPLICATIONS: None immediate. PROCEDURE: Informed written consent was obtained from the patient after a discussion of the risks, benefits and alternatives to treatment. A timeout was performed prior to the initiation of the procedure. Initial ultrasound scanning demonstrates a large amount of ascites within the right lower abdominal quadrant. The right lower abdomen was prepped and draped in the usual sterile fashion. 1% lidocaine with epinephrine was used for local anesthesia. Following this, a 6 Fr Safe-T-Centesis catheter was introduced. An ultrasound image was saved for documentation purposes. The paracentesis was performed. The catheter was removed and a dressing was applied. The patient tolerated the procedure well without immediate post procedural complication. FINDINGS: A total of  approximately 7000 mL of golden ascitic fluid was removed. IMPRESSION: Successful ultrasound-guided paracentesis yielding 7 liters of peritoneal fluid. Electronically Signed   By: Jacqulynn Cadet M.D.   On: 07/29/2019 10:54   Dg Chest Portable 1 View  Result Date: 07/28/2019 CLINICAL DATA:   Shortness of breath EXAM: PORTABLE CHEST 1 VIEW COMPARISON:  07/20/2019 FINDINGS: Diffuse interstitial pulmonary opacity and pulmonary vascular prominence similar to prior examination. No new or focal airspace opacity. Unchanged cardiomegaly. IMPRESSION: 1. Diffuse interstitial pulmonary opacity and pulmonary vascular prominence similar to prior examination. Findings are most consistent with edema. No new or focal airspace opacity. 2.  Unchanged cardiomegaly. Electronically Signed   By: Eddie Candle M.D.   On: 07/28/2019 16:23    Assessment and Plan:   Amer Alcindor is a 52 y.o. y/o male  with a history of decompensated alcoholic liver cirrhosis with ascites, nonbleeding grade 2 esophageal varices on medical prophylaxis, sarcopenia, ongoing alcohol consumption, tense ascites multiple admissions to the hospital for ascites, noncompliance, discharged [place] is here to follow-up.  Continues to drink alcohol but has cut down.  I informed him that the key to recovery is to stop alcohol completely.  He is in the process of contacting alcoholic anonymous. Explained chances of mortality is extremely high with ongoing alcohol use and poor liver function.  Plan 1.  Check labs including creatinine and INR today  2.  Complete autoimmune and viral hepatitis work-up.  Ordered at last visit but not obtained 3.  Right upper quadrant ultrasound to screen for [place] 4.  Low-salt diet 5.     Aldactone and Lasix will be titrated up based on lab results today 6.  EGD to screen for esophageal varices in April 2021 7.  Stop alcohol consumption. 8.    Continue lactulose but add Xifaxan due to ongoing hepatic encephalopathy 9.  Advised him to seek help from alcoholic anonymous. 10.  Suggest referral to palliative care to discuss end-of-life goals  Dr Jonathon Bellows  MD,MRCP [facility]) Follow up in 3 to 4 weeks

## 2019-08-27 ENCOUNTER — Telehealth: Payer: Self-pay | Admitting: Primary Care

## 2019-08-27 NOTE — Telephone Encounter (Signed)
Called to schedule Palliative Consult, no answer - left message with reason for call along with my contact information. 

## 2019-08-28 LAB — COMPREHENSIVE METABOLIC PANEL
ALT: 21 IU/L (ref 0–44)
AST: 51 IU/L — ABNORMAL HIGH (ref 0–40)
Albumin/Globulin Ratio: 0.7 — ABNORMAL LOW (ref 1.2–2.2)
Albumin: 2.9 g/dL — ABNORMAL LOW (ref 3.8–4.9)
Alkaline Phosphatase: 170 IU/L — ABNORMAL HIGH (ref 39–117)
BUN/Creatinine Ratio: 11 (ref 9–20)
BUN: 8 mg/dL (ref 6–24)
Bilirubin Total: 4.9 mg/dL — ABNORMAL HIGH (ref 0.0–1.2)
CO2: 19 mmol/L — ABNORMAL LOW (ref 20–29)
Calcium: 8.3 mg/dL — ABNORMAL LOW (ref 8.7–10.2)
Chloride: 92 mmol/L — ABNORMAL LOW (ref 96–106)
Creatinine, Ser: 0.75 mg/dL — ABNORMAL LOW (ref 0.76–1.27)
GFR calc Af Amer: 122 mL/min/{1.73_m2} (ref >59)
GFR calc non Af Amer: 105 mL/min/{1.73_m2} (ref >59)
Globulin, Total: 3.9 g/dL (ref 1.5–4.5)
Glucose: 149 mg/dL — ABNORMAL HIGH (ref 65–99)
Potassium: 4.2 mmol/L (ref 3.5–5.2)
Sodium: 124 mmol/L — ABNORMAL LOW (ref 134–144)
Total Protein: 6.8 g/dL (ref 6.0–8.5)

## 2019-08-28 LAB — ANTI-MICROSOMAL ANTIBODY LIVER / KIDNEY: LKM1 Ab: 1.1 Units (ref 0.0–20.0)

## 2019-08-28 LAB — IMMUNOGLOBULINS A/E/G/M, SERUM
IgA/Immunoglobulin A, Serum: 899 mg/dL — ABNORMAL HIGH (ref 90–386)
IgE (Immunoglobulin E), Serum: 1677 IU/mL — ABNORMAL HIGH (ref 6–495)
IgG (Immunoglobin G), Serum: 2226 mg/dL — ABNORMAL HIGH (ref 603–1613)
IgM (Immunoglobulin M), Srm: 245 mg/dL — ABNORMAL HIGH (ref 20–172)

## 2019-08-28 LAB — ALPHA-1-ANTITRYPSIN: A-1 Antitrypsin: 139 mg/dL (ref 101–187)

## 2019-08-28 LAB — CBC
Hematocrit: 31 % — ABNORMAL LOW (ref 37.5–51.0)
Hemoglobin: 11.6 g/dL — ABNORMAL LOW (ref 13.0–17.7)
MCH: 35.9 pg — ABNORMAL HIGH (ref 26.6–33.0)
MCHC: 37.4 g/dL — ABNORMAL HIGH (ref 31.5–35.7)
MCV: 96 fL (ref 79–97)
Platelets: 59 10*3/uL — CL (ref 150–450)
RBC: 3.23 x10E6/uL — ABNORMAL LOW (ref 4.14–5.80)
RDW: 13.2 % (ref 11.6–15.4)
WBC: 8.1 10*3/uL (ref 3.4–10.8)

## 2019-08-28 LAB — MITOCHONDRIAL/SMOOTH MUSCLE AB PNL
Mitochondrial Ab: <20 Units (ref 0.0–20.0)
Smooth Muscle Ab: 13 Units (ref 0–19)

## 2019-08-28 LAB — IRON,TIBC AND FERRITIN PANEL
Ferritin: 614 ng/mL — ABNORMAL HIGH (ref 30–400)
Iron Saturation: 71 % — ABNORMAL HIGH (ref 15–55)
Iron: 90 ug/dL (ref 38–169)
Total Iron Binding Capacity: 126 ug/dL — ABNORMAL LOW (ref 250–450)
UIBC: 36 ug/dL — ABNORMAL LOW (ref 111–343)

## 2019-08-28 LAB — HIV ANTIBODY (ROUTINE TESTING W REFLEX): HIV Screen 4th Generation wRfx: NONREACTIVE

## 2019-08-28 LAB — PROTIME-INR
INR: 1.4 — ABNORMAL HIGH (ref 0.9–1.2)
Prothrombin Time: 14.6 s — ABNORMAL HIGH (ref 9.1–12.0)

## 2019-08-28 LAB — CELIAC DISEASE AB SCREEN W/RFX
Antigliadin Abs, IgA: 11 units (ref 0–19)
Transglutaminase IgA: <2 U/mL (ref 0–3)

## 2019-08-28 LAB — HEPATITIS B SURFACE ANTIGEN: Hepatitis B Surface Ag: NEGATIVE

## 2019-08-28 LAB — CERULOPLASMIN: Ceruloplasmin: 12.9 mg/dL — ABNORMAL LOW (ref 16.0–31.0)

## 2019-08-28 LAB — HEPATITIS B E ANTIBODY: Hep B E Ab: NEGATIVE

## 2019-08-28 LAB — HEPATITIS B E ANTIGEN: Hep B E Ag: NEGATIVE

## 2019-08-28 LAB — ANA: Anti Nuclear Antibody (ANA): NEGATIVE

## 2019-08-28 LAB — HEPATITIS C ANTIBODY: Hep C Virus Ab: <0.1 s/co ratio (ref 0.0–0.9)

## 2019-08-28 LAB — HEPATITIS B SURFACE ANTIBODY,QUALITATIVE: Hep B Surface Ab, Qual: NONREACTIVE

## 2019-08-28 LAB — HEPATITIS B CORE ANTIBODY, TOTAL: Hep B Core Total Ab: NEGATIVE

## 2019-08-28 LAB — HEPATITIS A ANTIBODY, TOTAL: hep A Total Ab: POSITIVE — AB

## 2019-08-29 ENCOUNTER — Encounter: Payer: Self-pay | Admitting: Gastroenterology

## 2019-08-30 ENCOUNTER — Telehealth: Payer: Self-pay

## 2019-08-30 ENCOUNTER — Other Ambulatory Visit: Payer: Self-pay

## 2019-08-30 DIAGNOSIS — K7031 Alcoholic cirrhosis of liver with ascites: Secondary | ICD-10-CM

## 2019-08-30 DIAGNOSIS — K7290 Hepatic failure, unspecified without coma: Secondary | ICD-10-CM

## 2019-08-30 DIAGNOSIS — K7682 Hepatic encephalopathy: Secondary | ICD-10-CM

## 2019-08-30 MED ORDER — SPIRONOLACTONE 100 MG PO TABS: 200.0000 mg | ORAL_TABLET | Freq: Every day | ORAL | 2 refills | Status: DC

## 2019-08-30 NOTE — Telephone Encounter (Signed)
Spoke with pt sister in-law/caretaker, [name], and informed her of pt lab results and Dr. Georgeann Oppenheim recommendations. She understands and agrees. She plans to transport pt to the lab at [facility] on Thursday after pt completes his ultrasound appointment. She is also aware that pt has been referred to [facility] center for an eye exam to rule out KF ring. [facility] will contact pt to schedule. She agrees.

## 2019-08-30 NOTE — Telephone Encounter (Signed)
-----   Message from Jonathon Bellows, MD sent at 08/29/2019  5:44 PM EST ----- Inform   1. Increase aldactone to 200 mg ,continue lasix at 40 mg 2. CMP on Thursday  3. Needs Hep B vaccine 4. Ceruloplasmin low. Requires 24 hour urinary copper and eye exam to rule out "LF ring"

## 2019-08-30 NOTE — Progress Notes (Signed)
Pt has been referred to [facility] center for an eye exam to rule out KF ring. [facility] will contact pt to schedule.

## 2019-09-01 ENCOUNTER — Telehealth: Payer: Self-pay

## 2019-09-01 ENCOUNTER — Ambulatory Visit: Payer: Medicaid Other | Admitting: Gastroenterology

## 2019-09-01 ENCOUNTER — Other Ambulatory Visit: Payer: Self-pay

## 2019-09-01 DIAGNOSIS — K7031 Alcoholic cirrhosis of liver with ascites: Secondary | ICD-10-CM

## 2019-09-01 MED ORDER — ALBUMIN HUMAN 25 % IV SOLN: INTRAVENOUS | Status: DC

## 2019-09-01 NOTE — Telephone Encounter (Signed)
Spoke with pt sister in-law, [name], and informed her that Dr. Vicente Males has approved a standing order for pt to have paracentesis performed once a week for the next 4 weeks. She agrees and has been informed that pt is scheduled to have paracentesis tomorrow morning at 8:00.

## 2019-09-01 NOTE — Telephone Encounter (Signed)
Pt sister in-law left a VM requesting another paracentesis appointment for pt. She states pt has gained 20 lbs since 08-26-19 and has began to have trouble breathing again.

## 2019-09-01 NOTE — Telephone Encounter (Signed)
Yes please go ahead

## 2019-09-02 ENCOUNTER — Ambulatory Visit
Admission: RE | Admit: 2019-09-02 | Discharge: 2019-09-02 | Disposition: A | Discharge status: Home / Self Care | Payer: Medicaid Other | Source: Ambulatory Visit | Attending: Gastroenterology | Admitting: Gastroenterology

## 2019-09-02 ENCOUNTER — Ambulatory Visit: Admission: RE | Admit: 2019-09-02 | Payer: Medicaid Other | Source: Ambulatory Visit

## 2019-09-02 ENCOUNTER — Other Ambulatory Visit: Payer: Self-pay | Admitting: Gastroenterology

## 2019-09-02 ENCOUNTER — Ambulatory Visit
Admission: RE | Admit: 2019-09-02 | Discharge: 2019-09-02 | Disposition: A | Discharge status: Home / Self Care | Payer: Medicaid Other | Source: Ambulatory Visit

## 2019-09-02 ENCOUNTER — Other Ambulatory Visit: Payer: Self-pay

## 2019-09-02 DIAGNOSIS — K7031 Alcoholic cirrhosis of liver with ascites: Secondary | ICD-10-CM | POA: Diagnosis present

## 2019-09-02 MED ORDER — ALBUMIN HUMAN 25 % IV SOLN
INTRAVENOUS | Status: AC
  Filled 2019-09-02: qty 100

## 2019-09-02 MED ORDER — ALBUMIN HUMAN 25 % IV SOLN
25.0000 g | Freq: Once | INTRAVENOUS | Status: AC
  Administered 2019-09-02: 25 g via INTRAVENOUS

## 2019-09-02 MED ORDER — ALBUMIN HUMAN 25 % IV SOLN: 25.0000 g | Freq: Once | INTRAVENOUS | Status: DC

## 2019-09-03 ENCOUNTER — Telehealth: Payer: Self-pay

## 2019-09-03 ENCOUNTER — Encounter: Payer: Self-pay | Admitting: Gastroenterology

## 2019-09-03 NOTE — Telephone Encounter (Signed)
-----   Message from Jonathon Bellows, MD sent at 09/03/2019 10:45 AM EST ----- Cirrhsois and small ascites- nothing else

## 2019-09-03 NOTE — Progress Notes (Signed)
Cirrhsois and small ascites- nothing else

## 2019-09-03 NOTE — Telephone Encounter (Signed)
Spoke with pt's caretaker, Leonia Reeves, and informed her of pt ultrasound result.

## 2019-09-07 ENCOUNTER — Telehealth: Payer: Self-pay | Admitting: Primary Care

## 2019-09-07 ENCOUNTER — Other Ambulatory Visit: Payer: Self-pay

## 2019-09-07 DIAGNOSIS — K7031 Alcoholic cirrhosis of liver with ascites: Secondary | ICD-10-CM

## 2019-09-07 MED ORDER — ALBUMIN HUMAN 25 % IV SOLN: INTRAVENOUS | 3 refills | Status: DC

## 2019-09-07 NOTE — Telephone Encounter (Signed)
Called home number listed for patient and ended up speaking with patient's brother Jori Moll (this was his cell).  I explained where I was calling from and told him that I have been trying to reach patient and [name] to schedule the Consult and he was going to get in touch with [name] and ask her to call me back.

## 2019-09-07 NOTE — Telephone Encounter (Signed)
Called Frederick Cabrera to schedule the Palliative Consult, no answer - left message with reason for call along with my contact information.

## 2019-09-07 NOTE — Telephone Encounter (Signed)
Rec'd call back from [employer] and after discussing Palliative services with her she was in agreement with this.  I have scheduled a Telephone Consult for 09/20/19 @ 1 PM

## 2019-09-09 ENCOUNTER — Other Ambulatory Visit: Payer: Self-pay

## 2019-09-09 ENCOUNTER — Ambulatory Visit
Admission: RE | Admit: 2019-09-09 | Discharge: 2019-09-09 | Disposition: A | Discharge status: Home / Self Care | Payer: Medicaid Other | Source: Ambulatory Visit | Attending: Nurse Practitioner | Admitting: Nurse Practitioner

## 2019-09-09 DIAGNOSIS — K7031 Alcoholic cirrhosis of liver with ascites: Secondary | ICD-10-CM

## 2019-09-09 MED ORDER — ALBUMIN HUMAN 25 % IV SOLN
INTRAVENOUS | Status: AC
  Filled 2019-09-09: qty 100

## 2019-09-09 MED ORDER — ALBUMIN HUMAN 25 % IV SOLN
25.0000 g | Freq: Once | INTRAVENOUS | Status: AC
  Administered 2019-09-09: 25 g via INTRAVENOUS

## 2019-09-09 NOTE — Procedures (Signed)
Pre Procedural Dx: Symptomatic Ascites Post Procedural Dx: Same  Successful US guided paracentesis yielding 7.6 L of serous ascitic fluid.  EBL: None Complications: None immediate  Ronny Bacon, MD Pager #: [phone]

## 2019-09-16 ENCOUNTER — Other Ambulatory Visit: Payer: Self-pay

## 2019-09-16 ENCOUNTER — Ambulatory Visit
Admission: RE | Admit: 2019-09-16 | Discharge: 2019-09-16 | Disposition: A | Discharge status: Home / Self Care | Payer: Medicaid Other | Source: Ambulatory Visit | Attending: Gastroenterology | Admitting: Gastroenterology

## 2019-09-16 DIAGNOSIS — K7031 Alcoholic cirrhosis of liver with ascites: Secondary | ICD-10-CM

## 2019-09-16 MED ORDER — ALBUMIN HUMAN 25 % IV SOLN
INTRAVENOUS | Status: AC
  Filled 2019-09-16: qty 100

## 2019-09-16 MED ORDER — ALBUMIN HUMAN 25 % IV SOLN
INTRAVENOUS | Status: AC
  Administered 2019-09-16: 25 g via INTRAVENOUS
  Filled 2019-09-16: qty 100

## 2019-09-16 MED ORDER — ALBUMIN HUMAN 25 % IV SOLN: 25.0000 g | Freq: Once | INTRAVENOUS | Status: AC

## 2019-09-16 MED ORDER — ALBUMIN HUMAN 25 % IV SOLN: 25.0000 g | Freq: Once | INTRAVENOUS | Status: DC

## 2019-09-16 NOTE — Procedures (Signed)
Interventional Radiology Procedure:   Indications: Recurrent ascites  Procedure: US guided paracentesis  Findings: Removed 8600 ml from right lower quadrant  Complications: None     EBL: Less than 10 ml   Frederick Cabrera R. Anselm Pancoast, MD  Pager: [phone]

## 2019-09-20 ENCOUNTER — Other Ambulatory Visit: Payer: Self-pay

## 2019-09-20 ENCOUNTER — Other Ambulatory Visit: Payer: Medicaid Other | Admitting: Primary Care

## 2019-09-20 DIAGNOSIS — Z515 Encounter for palliative care: Secondary | ICD-10-CM

## 2019-09-20 DIAGNOSIS — K7031 Alcoholic cirrhosis of liver with ascites: Secondary | ICD-10-CM

## 2019-09-20 NOTE — Progress Notes (Addendum)
[facility] [facility] Palliative Care Consult Note Telephone: [phone]  Fax: [phone]  TELEHEALTH VISIT STATEMENT Due to the COVID-19 crisis, this visit was done via telemedicine from my office. It was initiated and consented to by this patient and/or family.  PATIENT NAME: Frederick Cabrera [address] [place] [address] [phone] (home)  DOB: 12-10-1965 MRN: [id number]  PRIMARY CARE PROVIDER: Romualdo Bolk, FNP [address] [place] [address] [phone]  REFERRING PROVIDER: Jonathon Bellows, [place],  [place] [address]     RESPONSIBLE PARTY:   Extended Emergency Contact Information Primary Emergency Contact: Sallee Lange Home Phone: [phone] Mobile Phone: [phone] Relation: Relative  I met by telemedicine /telephonic with patient caregiver. She requested telemedicine this time because she has had a recent fall and injury. Patient is 53 year old male living with his brother and sister-in-law. He was living in a garage, homeless in [place], and had lost contact with his brother. His sister-in-law found him on Facebook and his brother brought him to live with them.    ASSESSMENT AND RECOMMENDATIONS:   1. Advance Care Planning/Goals of Care: Goals include to maximize quality of life and symptom management.  There are no advance directives or health care power of attorney Living will etc. I will bring these documents when I make a home visit and we will discuss at that time. Sister-in-law states that he will probably name her as POA as the brothers do not always get along.  2. Symptom Management:  Comfort: Denies pain. Has trouble sleeping. Recommend hs medication such as trazodone for slepe. Patient has cirrhosis with weekly paracentesis for comfort.   Hygiene: PCG states a major problem is hygiene and self-care. They need help with caregiving but he is too young for [facility]  which she looked into and too  young for [employer]. He is often unkempt, not changing clothes not bathing et Ronney Asters. She requested some sort of help with personal care service. He is a Medicaid recipient and so I asked her to call his primary cares office and request a PCS referral.   Intake: She states his appetite is good and he is on a sodium restriction she is not sure if he's on a fluid restriction but she states that he drinks a gallon of milk every other day. I asked her to notice the sodium content.   Caregiver strain: We discussed setting some boundaries for him to remain in their home for help around the house and hygiene. I said up a time to make a home visit in three weeks in order to better assess the environment and the patient.  3. Family /Caregiver/Community Supports:  Previously homeless, lives with brother and SIL. May qualify for PCS.  4. Cognitive / Functional decline:  Some short term memory loss. Able to do some adls. Needs help with iadls.  5. Follow up Palliative Care Visit: Palliative care will continue to follow for goals of care clarification and symptom management. Return 3 weeks or prn.  I spent 60 minutes providing this consultation,  from 1300 to 1400. More than 50% of the time in this consultation was spent coordinating communication.   HISTORY OF PRESENT ILLNESS:  Frederick Cabrera is a 53 y.o. year old male with multiple medical problems including alcoholic cirrhosis of liver, short term memory loss. Palliative Care was asked to follow this patient by consultation request of Jonathon Bellows, MD [place],  [place]  [id number]  to help address advance care planning and goals of care. This is the initial visit.  CODE STATUS: TBD  PPS: 40% HOSPICE ELIGIBILITY/DIAGNOSIS: TBD  PAST MEDICAL HISTORY:  Past Medical History:  Diagnosis Date  . Alcoholic cirrhosis of liver with ascites ([facility])     SOCIAL HX:  Social History   Tobacco Use  . Smoking status: Current  Every Day Smoker    Packs/day: 0.50    Years: 20.00    Pack years: 10.00    Types: Cigarettes  . Smokeless tobacco: Never Used  Substance Use Topics  . Alcohol use: Yes    Comment: last drink 07/17/19    ALLERGIES:  Allergies  Allergen Reactions  . Wool Alcohol [Lanolin] Rash     PERTINENT MEDICATIONS:  Outpatient Encounter Medications as of 09/20/2019  Medication Sig  . albumin human 25 % bottle Inject 25 g albumin if 4 liters removed; inject an additional 25g if >=5 liters removed; max 50 g  . [START ON 09/30/2019] albumin human 25 % bottle Inject 25 g albumin if 4 liters removed; inject an additional 25g if >=5 liters removed, max 50 g. Frequency: once a week for 4 weeks  . folic acid (FOLVITE) 1 MG tablet Take 1 tablet (1 mg total) by mouth daily.  . furosemide (LASIX) 40 MG tablet Take 1 tablet (40 mg total) by mouth daily.  Marland Kitchen lactulose (CHRONULAC) 10 GM/15ML solution Take 15 mLs (10 g total) by mouth 3 (three) times daily.  . Multiple Vitamin (MULTIVITAMIN WITH MINERALS) TABS tablet Take 1 tablet by mouth daily.  . pantoprazole (PROTONIX) 40 MG tablet Take 1 tablet (40 mg total) by mouth 2 (two) times daily before a meal.  . propranolol (INDERAL) 20 MG tablet TAKE 1 TABLET BY MOUTH TWICE A DAY  . rifaximin (XIFAXAN) 550 MG TABS tablet Take 1 tablet (550 mg total) by mouth 2 (two) times daily.  Marland Kitchen spironolactone (ALDACTONE) 100 MG tablet Take 2 tablets (200 mg total) by mouth daily.   No facility-administered encounter medications on file as of 09/20/2019.    PHYSICAL EXAM / ROS:   deferred  Jason Coop, NP

## 2019-09-23 ENCOUNTER — Ambulatory Visit
Admission: RE | Admit: 2019-09-23 | Discharge: 2019-09-23 | Disposition: A | Discharge status: Home / Self Care | Payer: Medicaid Other | Source: Ambulatory Visit | Attending: Gastroenterology | Admitting: Gastroenterology

## 2019-09-23 ENCOUNTER — Other Ambulatory Visit: Payer: Self-pay

## 2019-09-23 DIAGNOSIS — K7031 Alcoholic cirrhosis of liver with ascites: Secondary | ICD-10-CM | POA: Diagnosis present

## 2019-09-23 MED ORDER — ALBUMIN HUMAN 25 % IV SOLN
INTRAVENOUS | Status: AC
  Administered 2019-09-23: 25 g via INTRAVENOUS
  Filled 2019-09-23: qty 100

## 2019-09-23 MED ORDER — ALBUMIN HUMAN 25 % IV SOLN
25.0000 g | Freq: Once | INTRAVENOUS | Status: AC
  Administered 2019-09-23: 25 g via INTRAVENOUS

## 2019-09-27 ENCOUNTER — Encounter: Payer: Self-pay | Admitting: Gastroenterology

## 2019-09-27 ENCOUNTER — Ambulatory Visit: Payer: Medicaid Other | Admitting: Gastroenterology

## 2019-09-27 NOTE — Progress Notes (Deleted)
Jonathon Bellows MD, MRCP(U.K) [address]  [place]  [place], [place] [address]  Main: [phone]  Fax: [phone]   Primary Care Physician: Romualdo Bolk, FNP  Primary Gastroenterologist:  Dr. Jonathon Bellows   No chief complaint on file.   HPI: Frederick Cabrera is a 54 y.o. male    Summary of history :  He is a patient who was seen by myself in May 2020 when admitted with decompensated cirrhosis. He carries a diagnosis of alcoholic liver cirrhosis with ascites and nonbleeding esophageal varices.  Continues to drink alcoholhas been seen by Dr. Alice Reichert in the past. Since May 2020 I see multiple hospital admissions for decompensated liver cirrhosis with ascites. Last inpatient note for for the patient wanted to go home and has not been compliant with GI or PCP follow-up.He discharged AMA.  The peritoneal fluid which was tapped on 07/29/2019 meets criteria for SBP but no growth was seen on the culture.On 07/29/2019 hemoglobin 11.3 g with an MCV of 102 and a platelet count of 56. Sodium 124 albumin 2.5 AST 79 ALT 28 total bilirubin 8.6. INR 1.5. 08/20/2019: ER visit for abdominal distention paracentesis found no evidence of SBP.  Hemoglobin 12.2 g with an MCV of 98.3.  Sodium 122, albumin 2.2, total bilirubin 5.2, alkaline phosphatase 146.  Creatinine 0.76   Interval history 08/26/2019-09/27/2019  08/26/2019: INR 1.4 increased Aldactone to 200 mg and Lasix 40 mg.  Suggested to obtain repeat CMP a few days after did not obtain the same.  Ceruloplasmin was low request 24-hour urinary copper and eye exam to rule out KF ring  09/02/2019: Right upper quadrant ultrasound, no focal liver lesion.  Cirrhosis with small volume ascites  Underwent large-volume paracentesis on 09/09/2019, 09/16/2019, 09/23/2019  Established care with palliative care Continues to drink alcohol but he has cut down on the quantity.  He has established care with his primary care doctor.  Has 3 bowel  movements a day.  Taking lactulose daily.  Today is here with his sister.  On Lasix 40 mg and Aldactone 100 mg a day.  He does have issues confusion.    Current Outpatient Medications  Medication Sig Dispense Refill  . albumin human 25 % bottle Inject 25 g albumin if 4 liters removed; inject an additional 25g if >=5 liters removed; max 50 g 200 mL   . [START ON 09/30/2019] albumin human 25 % bottle Inject 25 g albumin if 4 liters removed; inject an additional 25g if >=5 liters removed, max 50 g. Frequency: once a week for 4 weeks 130 mL 3  . folic acid (FOLVITE) 1 MG tablet Take 1 tablet (1 mg total) by mouth daily. 30 tablet 0  . furosemide (LASIX) 40 MG tablet Take 1 tablet (40 mg total) by mouth daily. 30 tablet 1  . lactulose (CHRONULAC) 10 GM/15ML solution Take 15 mLs (10 g total) by mouth 3 (three) times daily. 1350 mL 2  . Multiple Vitamin (MULTIVITAMIN WITH MINERALS) TABS tablet Take 1 tablet by mouth daily. 30 tablet 1  . pantoprazole (PROTONIX) 40 MG tablet Take 1 tablet (40 mg total) by mouth 2 (two) times daily before a meal. 60 tablet 2  . propranolol (INDERAL) 20 MG tablet TAKE 1 TABLET BY MOUTH TWICE A DAY 60 tablet 5  . rifaximin (XIFAXAN) 550 MG TABS tablet Take 1 tablet (550 mg total) by mouth 2 (two) times daily. 60 tablet 5  . spironolactone (ALDACTONE) 100 MG tablet Take 2 tablets (200  mg total) by mouth daily. 60 tablet 2   No current facility-administered medications for this visit.    Allergies as of 09/27/2019 - Review Complete 09/23/2019  Allergen Reaction Noted  . Wool alcohol [lanolin] Rash 02/05/2019    ROS:  General: Negative for anorexia, weight loss, fever, chills, fatigue, weakness. ENT: Negative for hoarseness, difficulty swallowing , nasal congestion. CV: Negative for chest pain, angina, palpitations, dyspnea on exertion, peripheral edema.  Respiratory: Negative for dyspnea at rest, dyspnea on exertion, cough, sputum, wheezing.  GI: See history of  present illness. GU:  Negative for dysuria, hematuria, urinary incontinence, urinary frequency, nocturnal urination.  Endo: Negative for unusual weight change.    Physical Examination:   There were no vitals taken for this visit.  General: Well-nourished, well-developed in no acute distress.  Eyes: No icterus. Conjunctivae pink. Mouth: Oropharyngeal mucosa moist and pink , no lesions erythema or exudate. Lungs: Clear to auscultation bilaterally. Non-labored. Heart: Regular rate and rhythm, no murmurs rubs or gallops.  Abdomen: Bowel sounds are normal, nontender, nondistended, no hepatosplenomegaly or masses, no abdominal bruits or hernia , no rebound or guarding.   Extremities: No lower extremity edema. No clubbing or deformities. Neuro: Alert and oriented x 3.  Grossly intact. Skin: Warm and dry, no jaundice.   Psych: Alert and cooperative, normal mood and affect.   Imaging Studies: US Paracentesis  Result Date: 09/23/2019 INDICATION: Alcoholic cirrhosis with recurrent symptomatic large volume ascites EXAM: ULTRASOUND GUIDED  PARACENTESIS MEDICATIONS: None. COMPLICATIONS: None immediate. PROCEDURE: Informed written consent was obtained from the patient after a discussion of the risks, benefits and alternatives to treatment. A timeout was performed prior to the initiation of the procedure. Initial ultrasound scanning demonstrates a moderate amount of ascites. The right lateral abdomen was prepped and draped in the usual sterile fashion. 1% lidocaine was used for local anesthesia. Following this, a Safe-T-Centesis catheter was introduced. An ultrasound image was saved for documentation purposes. The paracentesis was performed. The catheter was removed and a dressing was applied. The patient tolerated the procedure well without immediate post procedural complication. Patient received post-procedure intravenous albumin; see nursing notes for details. FINDINGS: A total of approximately 6.7 L of  clear yellow fluid was removed. IMPRESSION: Successful ultrasound-guided paracentesis yielding 6.7 liters of peritoneal fluid. Electronically Signed   By: Corlis Leak M.D.   On: 09/23/2019 15:02   US Paracentesis  Result Date: 09/16/2019 INDICATION: 54 year old with alcoholic cirrhosis of liver with recurrent ascites. EXAM: ULTRASOUND GUIDED PARACENTESIS MEDICATIONS: None. COMPLICATIONS: None immediate. PROCEDURE: Informed written consent was obtained from the patient after a discussion of the risks, benefits and alternatives to treatment. A timeout was performed prior to the initiation of the procedure. Initial ultrasound scanning demonstrates a large amount of ascites within the right lower abdominal quadrant. The right lower abdomen was prepped and draped in the usual sterile fashion. 1% lidocaine was used for local anesthesia. Following this, a 6 Fr Safe-T-Centesis catheter was introduced. An ultrasound image was saved for documentation purposes. The paracentesis was performed. The catheter was removed and a dressing was applied. The patient tolerated the procedure well without immediate post procedural complication. Patient received post-procedure intravenous albumin; see nursing notes for details. FINDINGS: A total of approximately 8.6 L of yellow fluid was removed. IMPRESSION: Successful ultrasound-guided paracentesis yielding 8.6 liters of peritoneal fluid. Electronically Signed   By: Richarda Overlie M.D.   On: 09/16/2019 12:37   US Paracentesis  Result Date: 09/09/2019 INDICATION: History of alcoholic cirrhosis with  recurrent symptomatic ascites. Please perform ultrasound-guided paracentesis for therapeutic purposes. EXAM: ULTRASOUND-GUIDED PARACENTESIS COMPARISON:  Multiple previous ultrasound-guided paracenteses, most recently 09/02/2019 yielding 10.2 L of peritoneal fluid. MEDICATIONS: None. COMPLICATIONS: None immediate. TECHNIQUE: Informed written consent was obtained from the patient after a  discussion of the risks, benefits and alternatives to treatment. A timeout was performed prior to the initiation of the procedure. Initial ultrasound scanning demonstrates a large amount of ascites within the right lower abdominal quadrant. The right lower abdomen was prepped and draped in the usual sterile fashion. 1% lidocaine with epinephrine was used for local anesthesia. An ultrasound image was saved for documentation purposed. An 8 Fr Safe-T-Centesis catheter was introduced. The paracentesis was performed. The catheter was removed and a dressing was applied. The patient tolerated the procedure well without immediate post procedural complication. FINDINGS: A total of approximately 7.6 liters of serous fluid was removed. IMPRESSION: Successful ultrasound-guided paracentesis yielding 7.6 liters of peritoneal fluid. Electronically Signed   By: Simonne Come M.D.   On: 09/09/2019 12:10   US Paracentesis  Result Date: 09/02/2019 INDICATION: Cirrhosis.  Ascites. EXAM: ULTRASOUND GUIDED  PARACENTESIS MEDICATIONS: None. COMPLICATIONS: None immediate. PROCEDURE: Informed written consent was obtained from the patient after a discussion of the risks, benefits and alternatives to treatment. A timeout was performed prior to the initiation of the procedure. Initial ultrasound scanning demonstrates a large amount of ascites within the right lower abdominal quadrant. The right lower abdomen was prepped and draped in the usual sterile fashion. 1% lidocaine was used for local anesthesia. Following this, a 6 French catheter was introduced. An ultrasound image was saved for documentation purposes. The paracentesis was performed. The catheter was removed and a dressing was applied. The patient tolerated the procedure well without immediate post procedural complication. Patient received post-procedure intravenous albumin; see nursing notes for details. FINDINGS: A total of approximately 10.2 L of clear yellow fluid was removed.  IMPRESSION: Successful ultrasound-guided paracentesis yielding 10.2 liters of peritoneal fluid. Electronically Signed   By: Maisie Fus  Register   On: 09/02/2019 10:22   US Abdomen Limited RUQ  Result Date: 09/02/2019 CLINICAL DATA:  History of cirrhosis. EXAM: ULTRASOUND ABDOMEN LIMITED RIGHT UPPER QUADRANT COMPARISON:  CT abdomen and pelvis 01/19/2019. FINDINGS: Gallbladder: No gallstones. Mild gallbladder wall thickening is likely due to hepatic dysfunction and ascites. No sonographic Murphy sign noted by sonographer. Common bile duct: Diameter: 0.9 cm Liver: No focal lesion. The liver appears shrunken with a nodular border. Portal vein is patent on color Doppler imaging with normal direction of blood flow towards the liver. Other: Small volume of perihepatic ascites is identified. IMPRESSION: Negative for focal liver lesion. Cirrhosis with a small volume of ascites. Electronically Signed   By: Drusilla Kanner M.D.   On: 09/02/2019 15:37    Assessment and Plan:   Frederick Cabrera is a 54 y.o. y/o male  with a history of decompensated alcoholic liver cirrhosis with ascites, nonbleeding grade 2 esophageal varices on medical prophylaxis,sarcopenia, ongoing alcohol consumption, tense ascitesmultiple admissions to the hospital for ascites, noncompliance, discharged AGAINST MEDICAL ADVICE is here to follow-up.  Continues to drink alcohol but has cut down.  I informed him that the key to recovery is to stop alcohol completely.  He is in the process of contacting alcoholic anonymous.Ceruloplasmin was low request 24-hour urinary copper and eye exam to rule out KF ring.  Celiac serology and autoimmune work-up was negative.  Explained chances of mortality is extremely high with ongoing alcohol use and poor liver  function.  Plan 1. Requires hepatitis B vaccine.  Check CMP today, HFE gene mutation as ferritin elevated as well as iron percentage saturation.  24-hour urinary copper. 2  Aldactone and Lasix will  be titrated up based on lab results today 3.  Continue Xifaxan for hepatic encephalopathy  4.  Seek help from alcoholic anonymous  5.  Continue care with palliative care  6. EGD to screen for esophageal varices in April 2021 7. Stop alcohol consumption. 8.  Low-salt diet 9.  As needed abdominal paracentesis  Dr Wyline Mood  MD,MRCP [facility]) Follow up in ***

## 2019-09-28 ENCOUNTER — Ambulatory Visit: Payer: Medicaid Other | Admitting: Gastroenterology

## 2019-09-30 ENCOUNTER — Other Ambulatory Visit: Payer: Self-pay

## 2019-09-30 ENCOUNTER — Ambulatory Visit
Admission: RE | Admit: 2019-09-30 | Discharge: 2019-09-30 | Disposition: A | Discharge status: Home / Self Care | Payer: Medicaid Other | Source: Ambulatory Visit | Attending: Gastroenterology | Admitting: Gastroenterology

## 2019-09-30 DIAGNOSIS — K7031 Alcoholic cirrhosis of liver with ascites: Secondary | ICD-10-CM | POA: Insufficient documentation

## 2019-09-30 MED ORDER — ALBUMIN HUMAN 25 % IV SOLN
INTRAVENOUS | Status: AC
  Administered 2019-09-30: 14:00:00 25 g via INTRAVENOUS
  Filled 2019-09-30: qty 200

## 2019-09-30 MED ORDER — ALBUMIN HUMAN 25 % IV SOLN
25.0000 g | Freq: Every day | INTRAVENOUS | Status: DC | PRN
  Administered 2019-09-30: 13:00:00 25 g via INTRAVENOUS

## 2019-10-07 ENCOUNTER — Ambulatory Visit
Admission: RE | Admit: 2019-10-07 | Discharge: 2019-10-07 | Disposition: A | Discharge status: Home / Self Care | Payer: Medicaid Other | Source: Ambulatory Visit | Attending: Gastroenterology | Admitting: Gastroenterology

## 2019-10-07 ENCOUNTER — Other Ambulatory Visit: Payer: Self-pay

## 2019-10-07 DIAGNOSIS — K7031 Alcoholic cirrhosis of liver with ascites: Secondary | ICD-10-CM | POA: Insufficient documentation

## 2019-10-07 MED ORDER — ALBUMIN HUMAN 25 % IV SOLN
INTRAVENOUS | Status: AC
  Administered 2019-10-07: 25 g via INTRAVENOUS
  Filled 2019-10-07: qty 100

## 2019-10-07 MED ORDER — ALBUMIN HUMAN 25 % IV SOLN: 25.0000 g | Freq: Once | INTRAVENOUS | Status: AC
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
## 2019-10-07 NOTE — Progress Notes (Signed)
Albumin infusion complete.  Offered to take patient to ER to be evaluated and he is now refusing.  Says it will take too long and he doesn't want to go. VS stable.  IV d/c and ride called to pick him up.

## 2019-10-10 ENCOUNTER — Emergency Department
Admission: EM | Admit: 2019-10-10 | Discharge: 2019-10-10 | Disposition: A | Discharge status: Home / Self Care | Payer: Medicaid Other | Attending: Emergency Medicine | Admitting: Emergency Medicine

## 2019-10-10 ENCOUNTER — Other Ambulatory Visit: Payer: Self-pay

## 2019-10-10 ENCOUNTER — Encounter: Payer: Self-pay | Admitting: Intensive Care

## 2019-10-10 DIAGNOSIS — Z79899 Other long term (current) drug therapy: Secondary | ICD-10-CM | POA: Insufficient documentation

## 2019-10-10 DIAGNOSIS — Y658 Other specified misadventures during surgical and medical care: Secondary | ICD-10-CM | POA: Insufficient documentation

## 2019-10-10 DIAGNOSIS — T8131XA Disruption of external operation (surgical) wound, not elsewhere classified, initial encounter: Secondary | ICD-10-CM | POA: Diagnosis not present

## 2019-10-10 DIAGNOSIS — F17210 Nicotine dependence, cigarettes, uncomplicated: Secondary | ICD-10-CM | POA: Diagnosis not present

## 2019-10-10 DIAGNOSIS — Z9889 Other specified postprocedural states: Secondary | ICD-10-CM

## 2019-10-10 DIAGNOSIS — L7682 Other postprocedural complications of skin and subcutaneous tissue: Secondary | ICD-10-CM | POA: Diagnosis present

## 2019-10-10 DIAGNOSIS — T8130XA Disruption of wound, unspecified, initial encounter: Secondary | ICD-10-CM

## 2019-10-10 HISTORY — DX: Paranoid schizophrenia: F20.0
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
## 2019-10-10 NOTE — Discharge Instructions (Signed)
Keep the wound clean, dry, and covered. Follow-up with your provider as needed.  °

## 2019-10-10 NOTE — ED Provider Notes (Signed)
[facility] Emergency Department Provider Note ____________________________________________  Time seen: 1820  I have reviewed the triage vital signs and the nursing notes.  HISTORY  Chief Complaint  Post-op Problem   HPI Mose Colaizzi is a 54 y.o. male presents with self to the ED for evaluation of leakage from his recent paracentesis puncture site.  Patient with a history of alcoholic cirrhosis with ascites, presents  after the glue patch on his recent paracentesis was dislodged accidentally.  Since time he said slowly of clear ascitic fluid from the left lower abdomen.  Patient denies any other concern at this time.  Past Medical History:  Diagnosis Date  . Alcoholic cirrhosis of liver with ascites ([place])   . Paranoid schizophrenia [facility])     Patient Active Problem List   Diagnosis Date Noted  . Metabolic acidosis 07/62/2633  . Macrocytosis 07/28/2019  . Thrombocytopenia ([place]) 07/28/2019  . Hyperbilirubinemia 07/28/2019  . 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 Hyponatremia 07/20/2019  . Ascites 07/08/2019  . Hypoxia 03/08/2019  . Respiratory distress 03/08/2019  . Abdominal pain 02/14/2019  . Ascites due to alcoholic cirrhosis ([place]) 38/93/7342  . Alcoholic cirrhosis of liver with ascites ([place])   . Goals of care, counseling/discussion   . Palliative care by specialist   . Hematemesis 01/17/2019    Past Surgical History:  Procedure Laterality Date  . ESOPHAGOGASTRODUODENOSCOPY (EGD) WITH PROPOFOL N/A 01/17/2019   Procedure: ESOPHAGOGASTRODUODENOSCOPY (EGD) WITH PROPOFOL;  Surgeon: Toledo, Benay Pike, MD;  Location: [facility] ENDOSCOPY;  Service: Gastroenterology;  Laterality: N/A;    Prior to Admission medications   Medication Sig Start Date End Date Taking? Authorizing Provider  albumin human 25 % bottle Inject 25 g albumin if 4 liters removed; inject an additional 25g if >=5 liters removed; max 50 g 09/01/19   Jonathon Bellows, MD  albumin human 25 % bottle  Inject 25 g albumin if 4 liters removed; inject an additional 25g if >=5 liters removed, max 50 g. Frequency: once a week for 4 weeks 09/30/19   Jonathon Bellows, MD  folic acid (FOLVITE) 1 MG tablet Take 1 tablet (1 mg total) by mouth daily. 02/17/19   Lang Snow, NP  furosemide (LASIX) 40 MG tablet Take 1 tablet (40 mg total) by mouth daily. 07/21/19 07/20/20  Fritzi Mandes, MD  lactulose (CHRONULAC) 10 GM/15ML solution Take 15 mLs (10 g total) by mouth 3 (three) times daily. 08/11/19 11/09/19  Jonathon Bellows, MD  Multiple Vitamin (MULTIVITAMIN WITH MINERALS) TABS tablet Take 1 tablet by mouth daily. 07/21/19   Fritzi Mandes, MD  pantoprazole (PROTONIX) 40 MG tablet Take 1 tablet (40 mg total) by mouth 2 (two) times daily before a meal. 07/21/19   Fritzi Mandes, MD  propranolol (INDERAL) 20 MG tablet TAKE 1 TABLET BY MOUTH TWICE A DAY 09/02/19   Jonathon Bellows, MD  rifaximin (XIFAXAN) 550 MG TABS tablet Take 1 tablet (550 mg total) by mouth 2 (two) times daily. 08/26/19   Jonathon Bellows, MD  spironolactone (ALDACTONE) 100 MG tablet Take 2 tablets (200 mg total) by mouth daily. 08/30/19   Jonathon Bellows, MD    Allergies Codeine and Wool alcohol [lanolin]  Family History  Problem Relation Age of Onset  . Heart disease Father     Social History Social History   Tobacco Use  . Smoking status: Current Every Day Smoker    Packs/day: 0.50    Years: 20.00    Pack years: 10.00    Types: Cigarettes  . Smokeless  tobacco: Never Used  Substance Use Topics  . Alcohol use: Yes    Comment: reports on average half a 5th a day  . Drug use: Yes    Types: Marijuana    Review of Systems  Constitutional: Negative for fever. Cardiovascular: Negative for chest pain. Respiratory: Negative for shortness of breath. Gastrointestinal: Negative for abdominal pain, vomiting and diarrhea. Genitourinary: Negative for dysuria. Musculoskeletal: Negative for back pain. Skin: Negative for rash.  Drainage from paracentesis  injection site Neurological: Negative for headaches, focal weakness or numbness. ____________________________________________  PHYSICAL EXAM:  VITAL SIGNS: ED Triage Vitals  Enc Vitals Group     BP 10/10/19 1711 127/76     Pulse Rate 10/10/19 1711 98     Resp 10/10/19 1711 16     Temp 10/10/19 1711 98.8 F (37.1 C)     Temp Source 10/10/19 1711 Oral     SpO2 10/10/19 1711 99 %     Weight 10/10/19 1712 220 lb (99.8 kg)     Height 10/10/19 1712 5\' 5"  (1.651 m)     Head Circumference --      Peak Flow --      Pain Score 10/10/19 1712 0     Pain Loc --      Pain Edu? --      Excl. in GC? --     Constitutional: Alert and oriented. Well appearing and in no distress. Head: Normocephalic and atraumatic. Eyes: Conjunctivae are normal. Normal extraocular movements Cardiovascular: Normal rate, regular rhythm. Normal distal pulses. Respiratory: Normal respiratory effort. No wheezes/rales/rhonchi. Gastrointestinal: Soft, protuberant and nontender. Moderately distended. LLQ paracentesis puncture site with slow drip of ascites fluid. No surrounding erythema or induration. Musculoskeletal: Nontender with normal range of motion in all extremities.  Neurologic:  Normal gait without ataxia. Normal speech and language. No gross focal neurologic deficits are appreciated. Skin:  Skin is warm, dry and intact. No rash noted. Psychiatric: Mood and affect are normal. Patient exhibits appropriate insight and judgment. ____________________________________________  PROCEDURES  Procedures  Dermabond glue patch applied Telfa + Tegaderm applied ____________________________________________  INITIAL IMPRESSION / ASSESSMENT AND PLAN / ED COURSE  Patient with weekly therapeutic paracentesis for his ascites, presents with premature disruption of his glue patch.  Patient notes slow leakage since this morning from his paracentesis site.  The wound was cleansed and a Dermabond glue patch was reapplied.   Adequate abortion of the paracentesis leak was achieved.  The wound is then covered with a nonstick Telfa and a Tegaderm overlying.  Patient is discharged to follow-up with his primary provider for report for his routine paracentesis next week.  Return precautions have been reviewed.  Ulric Salzman was evaluated in Emergency Department on 10/10/2019 for the symptoms described in the history of present illness. He was evaluated in the context of the global COVID-19 pandemic, which necessitated consideration that the patient might be at risk for infection with the SARS-CoV-2 virus that causes COVID-19. Institutional protocols and algorithms that pertain to the evaluation of patients at risk for COVID-19 are in a state of rapid change based on information released by regulatory bodies including the CDC and federal and state organizations. These policies and algorithms were followed during the patient's care in the ED. ____________________________________________  FINAL CLINICAL IMPRESSION(S) / ED DIAGNOSES  Final diagnoses:  S/P abdominal paracentesis  Wound dehiscence      10/12/2019, Karmen Stabs, PA-C 10/10/19 1840    10/12/19, MD 10/10/19 [phone]

## 2019-10-10 NOTE — ED Triage Notes (Signed)
Patient reports he had paracentesis at [facility] Thursday 10/07/2019 and woke up this morning and the site is leaking fluid. Dressing on Left side of abdomen

## 2019-10-12 ENCOUNTER — Other Ambulatory Visit: Payer: Self-pay

## 2019-10-12 ENCOUNTER — Other Ambulatory Visit: Payer: Self-pay | Admitting: Internal Medicine

## 2019-10-12 DIAGNOSIS — K7031 Alcoholic cirrhosis of liver with ascites: Secondary | ICD-10-CM

## 2019-10-12 MED ORDER — ALBUMIN HUMAN 25 % IV SOLN: INTRAVENOUS | 3 refills | Status: DC

## 2019-10-14 ENCOUNTER — Other Ambulatory Visit: Payer: Self-pay

## 2019-10-14 ENCOUNTER — Ambulatory Visit
Admission: RE | Admit: 2019-10-14 | Discharge: 2019-10-14 | Disposition: A | Discharge status: Home / Self Care | Payer: Medicaid Other | Source: Ambulatory Visit | Attending: Gastroenterology | Admitting: Gastroenterology

## 2019-10-14 DIAGNOSIS — K7031 Alcoholic cirrhosis of liver with ascites: Secondary | ICD-10-CM

## 2019-10-14 MED ORDER — ALBUMIN HUMAN 25 % IV SOLN: 25.0000 g | Freq: Once | INTRAVENOUS | Status: AC

## 2019-10-14 MED ORDER — ALBUMIN HUMAN 25 % IV SOLN
INTRAVENOUS | Status: AC
  Administered 2019-10-14: 25 g via INTRAVENOUS
  Filled 2019-10-14: qty 100

## 2019-10-14 NOTE — Procedures (Signed)
  Procedure: US paracentesis RLQ 4.7L EBL:   minimal Complications:  none immediate  See full dictation in [employer].  Thora Lance MD Main # [phone] Pager  [phone]

## 2019-10-15 ENCOUNTER — Other Ambulatory Visit: Payer: Medicaid Other | Admitting: Primary Care

## 2019-10-15 DIAGNOSIS — Z515 Encounter for palliative care: Secondary | ICD-10-CM

## 2019-10-15 NOTE — Progress Notes (Signed)
[facility] Consult Note Telephone: [phone]  Fax: [phone]  PATIENT NAME: Frederick Cabrera [address] [place] [address] [phone] (home)  DOB: 01-Feb-1966 MRN: [id number]  PRIMARY CARE PROVIDER:   Romualdo Bolk, FNP, [address] [place] [address] [phone]  REFERRING PROVIDER:  Jonathon Bellows MD  RESPONSIBLE PARTY:   Extended Emergency Contact Information Primary Emergency Contact: Sallee Lange Home Phone: [phone] Mobile Phone: [phone] Relation: Relative Secondary Emergency Contact: Juddson, Cobern Mobile Phone: [phone] Relation: Brother  ASSESSMENT AND RECOMMENDATIONS:   1. Advance Care Planning/Goals of Care: Goals include to maximize quality of life and symptom management.  He is newly living with twin brother and his wife, after having been homeless in [place]. Complex psych history per his report including paranoia, schizophrenia, bipolar disorder and substance (alcohol) abuse. He denies other drug use.  2. Symptom Management:   Community needs: Needs SW to assess for many services, needs PCP ASAP and  especially a psychiatrist due to long history of paranoid schizophrenia, bipolar and alcoholism. I have made a referral to Odette Fraction NP. [name] will call for an appointment. I explained that a PCP would be the first step to the other services he mentioned e.g. DME, med management and psych referral.  Mental health:  He is off of his psychiatric medications and feels this is an urgent need. He does not remember what he has been on and does not know any prior provider. He is afraid of this thoughts and says he wants to get that under control asap. He states he has done 12 steps AA but his sister in law does not think this is accurate. Apparently he has some TBI history as well. No records are available from [place].  Personal care: Needs PCS services from DSS. Referred to PCP office for  referral. He has medicaid from disability.  DME: Needs grabber to keep from falling when bending over, also  needs raised toilet seat and tub sliding bench. Needs grab bar as well. Referred to visit with PCP to order.  3. Family /Caregiver/Community Supports: Lives with twin brother and SIL in mobile home. He laments not having transportation and being able to get around town. I also referred them to [place] transportation. Cost is $10 round trip which is prohibitive but I asked [name] to see if there is a rate for the disabled.  4. Cognitive / Functional decline: Alert, oriented x 1-2. Forgetful due to TBI and other psychiatric diagnoses. He is very open and forthcoming about his mental health needs and what he needs for health. Currently drinking daily, smoking daily./ Currently he also has severe ascites being drained weekly. Functionally needs assistance with many adls and iadls.  5. Follow up Palliative Care Visit: Palliative care will continue to follow for goals of care clarification and symptom management. Return 2 weeks or prn.  I spent 60 minutes providing this consultation,  from 1300 to 1400. More than 50% of the time in this consultation was spent coordinating communication.   HISTORY OF PRESENT ILLNESS:  Frederick Cabrera is a 54 y.o. year old male with multiple medical problems including alcoholic cirrhosis of liver, short term memory loss.. Palliative Care was asked to follow this patient by consultation request of Jonathon Bellows MD to help address advance care planning and goals of care. This is a follow up visit.  CODE STATUS: TBD  PPS: 40% HOSPICE ELIGIBILITY/DIAGNOSIS: TBD  PAST MEDICAL HISTORY:  Past Medical History:  Diagnosis Date  . Alcoholic cirrhosis of liver with ascites (HCC)   . Paranoid schizophrenia (HCC)     SOCIAL HX:  Social History   Tobacco Use  . Smoking status: Current Every Day Smoker    Packs/day: 0.50    Years: 20.00    Pack years: 10.00    Types:  Cigarettes  . Smokeless tobacco: Never Used  Substance Use Topics  . Alcohol use: Yes    Comment: reports on average half a 5th a day    ALLERGIES:  Allergies  Allergen Reactions  . Codeine     emesis  . Wool Alcohol [Lanolin] Rash     PERTINENT MEDICATIONS:  Outpatient Encounter Medications as of 10/15/2019  Medication Sig  . albumin human 25 % bottle Inject 25 g albumin if 4 liters removed; inject an additional 25g if >=5 liters removed, max 50 g. Frequency: once a week for 4 weeks  . [START ON 10/28/2019] albumin human 25 % bottle Inject 25 g albumin if 4 liters removed; inject an additional 25g if >=5 liters removed; max 50 g. Frequency: Once a week for 4 weeks beginning on 10/28/19.  . folic acid (FOLVITE) 1 MG tablet Take 1 tablet (1 mg total) by mouth daily.  . furosemide (LASIX) 40 MG tablet Take 1 tablet (40 mg total) by mouth daily.  Marland Kitchen lactulose (CHRONULAC) 10 GM/15ML solution Take 15 mLs (10 g total) by mouth 3 (three) times daily.  . Multiple Vitamin (MULTIVITAMIN WITH MINERALS) TABS tablet Take 1 tablet by mouth daily.  . pantoprazole (PROTONIX) 40 MG tablet Take 1 tablet (40 mg total) by mouth 2 (two) times daily before a meal.  . propranolol (INDERAL) 20 MG tablet TAKE 1 TABLET BY MOUTH TWICE A DAY  . rifaximin (XIFAXAN) 550 MG TABS tablet Take 1 tablet (550 mg total) by mouth 2 (two) times daily.  Marland Kitchen spironolactone (ALDACTONE) 100 MG tablet Take 2 tablets (200 mg total) by mouth daily.   No facility-administered encounter medications on file as of 10/15/2019.    PHYSICAL EXAM / ROS:   Current and past weights: unavailable. General: NAD, frail appearing, ascites Cardiovascular: no chest pain reported, no edema Pulmonary: no cough, no increased SOB, room air Abdomen: appetite fair, endorses constipation, incontinent of bowel, abdominal hernia, going weekly for paracentesis.  GU: denies dysuria, continent of urine MSK:  no joint deformities, ambulatory with walker,, fall  risk Skin: no rashes or wounds reported Neurological: Weakness, short term memory loss, denies pain  Eliezer Lofts, NP, [facility]  COVID-19 PATIENT SCREENING TOOL  Person answering questions: _________reba__________ _____   1.  Is the patient or any family member in the home showing any signs or symptoms regarding respiratory infection?               Person with Symptom- _____________na______________  a. Fever                                                                          Yes___ No___          ___________________  b. Shortness of breath  Yes___ No___          ___________________ c. Cough/congestion                                       Yes___  No___         ___________________ d. Body aches/pains                                                         Yes___ No___        ____________________ e. Gastrointestinal symptoms (diarrhea, nausea)           Yes___ No___        ____________________  2. Within the past 14 days, has anyone living in the home had any contact with someone with or under investigation for COVID-19?    Yes___ No_x_   Person __________________

## 2019-10-21 ENCOUNTER — Ambulatory Visit: Payer: Medicaid Other

## 2019-10-26 ENCOUNTER — Other Ambulatory Visit: Payer: Self-pay

## 2019-10-26 ENCOUNTER — Ambulatory Visit
Admission: RE | Admit: 2019-10-26 | Discharge: 2019-10-26 | Disposition: A | Discharge status: Home / Self Care | Payer: Medicaid Other | Source: Ambulatory Visit | Attending: Gastroenterology | Admitting: Gastroenterology

## 2019-10-26 DIAGNOSIS — K7031 Alcoholic cirrhosis of liver with ascites: Secondary | ICD-10-CM | POA: Insufficient documentation

## 2019-10-26 MED ORDER — ALBUMIN HUMAN 25 % IV SOLN
INTRAVENOUS | Status: AC
  Administered 2019-10-26: 15:00:00 25 g via INTRAVENOUS
  Filled 2019-10-26: qty 200

## 2019-10-26 MED ORDER — ALBUMIN HUMAN 25 % IV SOLN: 25.0000 g | Freq: Once | INTRAVENOUS | Status: AC

## 2019-10-26 MED ORDER — ALBUMIN HUMAN 25 % IV SOLN
25.0000 g | Freq: Once | INTRAVENOUS | Status: AC
  Administered 2019-10-26: 14:00:00 25 g via INTRAVENOUS

## 2019-10-28 ENCOUNTER — Ambulatory Visit: Payer: Medicaid Other | Admitting: Nurse Practitioner

## 2019-10-28 ENCOUNTER — Other Ambulatory Visit: Payer: Medicaid Other | Admitting: Primary Care

## 2019-11-01 ENCOUNTER — Other Ambulatory Visit: Payer: Self-pay

## 2019-11-01 ENCOUNTER — Other Ambulatory Visit: Payer: Medicaid Other

## 2019-11-01 ENCOUNTER — Other Ambulatory Visit: Payer: Medicaid Other | Admitting: Primary Care

## 2019-11-01 DIAGNOSIS — Z515 Encounter for palliative care: Secondary | ICD-10-CM

## 2019-11-01 NOTE — Progress Notes (Signed)
COMMUNITY PALLIATIVE CARE SW NOTE  PATIENT NAME: Frederick Cabrera DOB: 12-22-1965 MRN: [id number]  PRIMARY CARE PROVIDER: Romualdo Bolk, FNP  RESPONSIBLE PARTY:  Acct ID - Guarantor Home Phone Work Phone Relationship Acct Type  [id number] KILAN, [phone]  Self P/F     [address] [place], [place] [address]     PLAN OF CARE and INTERVENTIONS:             1. GOALS OF CARE/ ADVANCE CARE PLANNING:  Patient is FULL CODE. NP left Five Wishes and MOST form for patient/family to discuss. Goal is for patient to establish with PCP and get connected with mental health services. 2. SOCIAL/EMOTIONAL/SPIRITUAL ASSESSMENT/ INTERVENTIONS:  SW and Katie, NP met with patient and [name] (sister-in-law) in the home. Patient looked tired, walked in from his room and said he was not "feeling as well" today. Katie discussed recent paracentesis. Patient and [name] discussed patient's history with SW. Patient has been living with [place] and his brother for about a year, moved from [place] to [place]. Discussed his previous experience with homelessness. Patient also discussed his substance use. Patient is drinking 6-8 beers and 2-4 shots per day. Patient said he stopped briefly but his brother drinks and it was difficult to quit with him drinking in the home. Discussed mental health. Patient said he was diagnosed with paranoid schizophrenia and bipolar disorder. Patient acknowledged a extensive history with psych hospitalizations. Patient said he does have suicidal thoughts, last time was on Friday night. [name] reports it is worse when he drinks vodka. Patient is open about his thoughts and feelings, and does share with [name]. Patient last attempt was in 2019, he said that he cut his wrists and was hospitalized. Patient denies SI during visit today. Patient noted that he does have hallucinations, states that he has the "ability to see things". Patient said he can see and hear [name]'s father and sister in the home.  Patient is interested in mental health resources and support. SW provided emotional support, assessed safety, discussed goals, provided resource information and validated feelings/concerns. 3. PATIENT/CAREGIVER EDUCATION/ COPING:  Patient is alert, oriented to person and place. Patient was pleasant, calm during visit. Patient is forgetful and says that he struggles with short-term memory. Patient and his brother "fuss", but Leonia Reeves said they are supportive of one another. Leonia Reeves is supportive of patient and tries to help coordinate care. 4. PERSONAL EMERGENCY PLAN:  Family will call 9-1-1 for emergencies. 5. COMMUNITY RESOURCES COORDINATION/ HEALTH CARE NAVIGATION:  Patient had new PCP appointment on Friday, 2/12 with Marnee Guarneri, NP. Discussed [place], local church ramp resources, day programs. SW is completing referral to Psychotherapeutic Services (ACT team).    6. FINANCIAL/LEGAL CONCERNS/INTERVENTIONS:  Patient receives SSDI. Patient has active Medicaid. Patient discussed limited finances and strain on the household.     SOCIAL HX:  Social History   Tobacco Use  . Smoking status: Current Every Day Smoker    Packs/day: 0.50    Years: 20.00    Pack years: 10.00    Types: Cigarettes  . Smokeless tobacco: Never Used  Substance Use Topics  . Alcohol use: Yes    Comment: reports on average half a 5th a day    CODE STATUS:   Code Status: Prior (FULL) ADVANCED DIRECTIVES: N MOST FORM COMPLETE:  Copy left in the home. HOSPICE EDUCATION PROVIDED: Yes.  PPS: Patient is mostly independent of ADLs, but is weaker and needs assistance with meals. Discussed safety, patient  walks with a cane. Patient/family considering DME.  I spent 60 minutes with patient/family, from 1:00-2:00p providing education, support and consultation.   Margaretmary Lombard, LCSW

## 2019-11-01 NOTE — Progress Notes (Signed)
Therapist, nutritional Palliative Care Consult Note Telephone: [phone]  Fax: [phone]  PATIENT NAME: Frederick Cabrera [address] [place] [address] [phone] (home)  DOB: 01-Oct-1965 MRN: [id number]  PRIMARY CARE PROVIDER:   Olena Leatherwood, FNP, [address] [place] [address] [phone]  Has an appt this week with Aura Dials, NP as new PCP.   REFERRING PROVIDER:  Olena Leatherwood, FNP [address] [place],  [place] [address] [phone]  RESPONSIBLE PARTY:   Extended Emergency Contact Information Primary Emergency Contact: Dianne Dun Home Phone: [phone] Mobile Phone: [phone] Relation: Relative Secondary Emergency Contact: Demitri, Kucinski Mobile Phone: [phone] Relation: Brother  ASSESSMENT AND RECOMMENDATIONS:   1. Advance Care Planning/Goals of Care: Goals include to maximize quality of life and symptom management. We discussed Five Wishes, and the MOST form. I outlined the 5 wishes booklet and they will review and fill out the MOST.  2. Symptom Management:   Ascites: Has increased over past several weeks, was drained 5 days ago of 8.4 L.  Has exacerbated with liver disease, still drinking 8 packs of beer a day, and some  shots.  We discussed getting a hospital bed for positioning with the ascites. He also noted he could use a BSC and shower chair.   Mental health history/paranoid schizophrenic: Was on the following regimen Trazadone, ritalin, cogentin, olanzipine in the past.States he cannot take seroquel, States SI. We discussed that it is worse after drinking hard liquor. States a history of an attempt but cutting wrists. He is able to speak to his SIL if he feels this and feels he is not in immediate danger.  Encephalopathy: Shows some left sided asterixis and endorses some left leg tremors as well. However this appears it may have been staged at a later time in our interview. He endorses ongoing alcohol use.   3.  Family /Caregiver/Community Supports: Living with brother and sister in law x 15 months.  We gave them several support numbers: [facility], [facility],medicaid transport for dr. Tyrell Antonio.   4. Cognitive / Functional decline:  States some hallucinations, talks to people at night and it keeps him awake. Able to ambulate with cane but has some difficulty with steps. Needs a ramp, may be available through with some community resources. Feels he is losing some function although his SIL does not remember some of his stories.   5. Follow up Palliative Care Visit: Palliative care will continue to follow for goals of care clarification and symptom management. Return 4 weeks or prn.   I spent 60 minutes providing this consultation,  from 1300 to 1400. More than 50% of the time in this consultation was spent coordinating communication.   HISTORY OF PRESENT ILLNESS:  Frederick Cabrera is a 54 y.o. year old male with multiple medical problems including alcoholic cirrhosis of liver, short term memory loss. Palliative Care was asked to follow this patient by consultation request of Olena Leatherwood, FNP to help address advance care planning and goals of care. This is a follow up visit.  CODE STATUS: TBD, MOST left  PPS: 40% HOSPICE ELIGIBILITY/DIAGNOSIS: TBD  PAST MEDICAL HISTORY:  Past Medical History:  Diagnosis Date  . Alcoholic cirrhosis of liver with ascites (HCC)   . Paranoid schizophrenia (HCC)     SOCIAL HX:  Social History   Tobacco Use  . Smoking status: Current Every Day Smoker    Packs/day: 0.50    Years: 20.00    Pack years: 10.00  Types: Cigarettes  . Smokeless tobacco: Never Used  Substance Use Topics  . Alcohol use: Yes    Comment: reports on average half a 5th a day    ALLERGIES:  Allergies  Allergen Reactions  . Codeine     emesis  . Wool Alcohol [Lanolin] Rash     PERTINENT MEDICATIONS:  Outpatient Encounter Medications as of 11/01/2019  Medication Sig  . albumin human 25 %  bottle Inject 25 g albumin if 4 liters removed; inject an additional 25g if >=5 liters removed, max 50 g. Frequency: once a week for 4 weeks  . albumin human 25 % bottle Inject 25 g albumin if 4 liters removed; inject an additional 25g if >=5 liters removed; max 50 g. Frequency: Once a week for 4 weeks beginning on 10/28/19.  . folic acid (FOLVITE) 1 MG tablet Take 1 tablet (1 mg total) by mouth daily.  . furosemide (LASIX) 40 MG tablet Take 1 tablet (40 mg total) by mouth daily.  Marland Kitchen lactulose (CHRONULAC) 10 GM/15ML solution Take 15 mLs (10 g total) by mouth 3 (three) times daily.  . Multiple Vitamin (MULTIVITAMIN WITH MINERALS) TABS tablet Take 1 tablet by mouth daily.  . pantoprazole (PROTONIX) 40 MG tablet Take 1 tablet (40 mg total) by mouth 2 (two) times daily before a meal.  . propranolol (INDERAL) 20 MG tablet TAKE 1 TABLET BY MOUTH TWICE A DAY  . rifaximin (XIFAXAN) 550 MG TABS tablet Take 1 tablet (550 mg total) by mouth 2 (two) times daily.  Marland Kitchen spironolactone (ALDACTONE) 100 MG tablet Take 2 tablets (200 mg total) by mouth daily.   No facility-administered encounter medications on file as of 11/01/2019.    PHYSICAL EXAM / ROS:   Current and past weights: unavailable General: NAD, frail appearing, large ascites Cardiovascular: no chest pain reported, 1-2+ LE edema  Pulmonary: no cough, no increased SOB, endorses DOE, Room air, daily smoker. Abdomen: appetite fair to good, occ constipation, incontinent of bowel, paracentesis 8.4L each week. Albumin 2.9 two months ago. GU: denies dysuria, incontinent of urine at times MSK:  no joint deformities, ambulatory with cane. + Fall risk Skin: no rashes or wounds reported Neurological: Weakness,denies seizures, endorses hallucinations,  Drowsiness and TBI with short term memory loss.   Eliezer Lofts, NP [facility]  COVID-19 PATIENT SCREENING TOOL  Person answering questions: ________Richard___________ _____   1.  Is the patient or any  family member in the home showing any signs or symptoms regarding respiratory infection?               Person with Symptom- ___________________________  a. Fever                                                                          Yes___ No___          ___________________  b. Shortness of breath                                                    Yes___ No___  ___________________ c. Cough/congestion                                       Yes___  No___         ___________________ d. Body aches/pains                                                         Yes___ No___        ____________________ e. Gastrointestinal symptoms (diarrhea, nausea)           Yes___ No___        ____________________  2. Within the past 14 days, has anyone living in the home had any contact with someone with or under investigation for COVID-19?    Yes___ No x__   Person __________________

## 2019-11-02 ENCOUNTER — Ambulatory Visit: Payer: Medicaid Other | Admitting: Gastroenterology

## 2019-11-03 ENCOUNTER — Ambulatory Visit
Admission: RE | Admit: 2019-11-03 | Discharge: 2019-11-03 | Disposition: A | Discharge status: Home / Self Care | Payer: Medicaid Other | Source: Ambulatory Visit | Attending: Gastroenterology | Admitting: Gastroenterology

## 2019-11-03 ENCOUNTER — Other Ambulatory Visit: Payer: Self-pay

## 2019-11-03 DIAGNOSIS — K7031 Alcoholic cirrhosis of liver with ascites: Secondary | ICD-10-CM | POA: Diagnosis present

## 2019-11-03 MED ORDER — ALBUMIN HUMAN 25 % IV SOLN: 25.0000 g | Freq: Once | INTRAVENOUS | Status: DC

## 2019-11-03 MED ORDER — ALBUMIN HUMAN 25 % IV SOLN
25.0000 g | Freq: Once | INTRAVENOUS | Status: AC
  Administered 2019-11-03: 25 g via INTRAVENOUS

## 2019-11-03 MED ORDER — ALBUMIN HUMAN 25 % IV SOLN
INTRAVENOUS | Status: AC
  Filled 2019-11-03: qty 200

## 2019-11-03 NOTE — Procedures (Signed)
Pre Procedural Dx: Symptomatic Ascites Post Procedural Dx: Same  Successful US guided paracentesis yielding 7.7 L of serous ascitic fluid.  EBL: None Complications: None immediate  Katherina Right, MD Pager #: [phone]

## 2019-11-04 ENCOUNTER — Ambulatory Visit: Payer: Medicaid Other

## 2019-11-05 ENCOUNTER — Other Ambulatory Visit: Payer: Self-pay

## 2019-11-05 ENCOUNTER — Ambulatory Visit (INDEPENDENT_AMBULATORY_CARE_PROVIDER_SITE_OTHER): Payer: Medicaid Other | Admitting: Nurse Practitioner

## 2019-11-05 ENCOUNTER — Encounter: Payer: Self-pay | Admitting: Nurse Practitioner

## 2019-11-05 VITALS — BP 113/56 | HR 88 | Temp 97.9°F | Wt 193.6 lb

## 2019-11-05 DIAGNOSIS — F200 Paranoid schizophrenia: Secondary | ICD-10-CM | POA: Diagnosis not present

## 2019-11-05 DIAGNOSIS — K7031 Alcoholic cirrhosis of liver with ascites: Secondary | ICD-10-CM | POA: Diagnosis not present

## 2019-11-05 DIAGNOSIS — K429 Umbilical hernia without obstruction or gangrene: Secondary | ICD-10-CM

## 2019-11-05 DIAGNOSIS — I10 Essential (primary) hypertension: Secondary | ICD-10-CM

## 2019-11-05 DIAGNOSIS — Z7189 Other specified counseling: Secondary | ICD-10-CM

## 2019-11-05 DIAGNOSIS — F1020 Alcohol dependence, uncomplicated: Secondary | ICD-10-CM

## 2019-11-05 DIAGNOSIS — F17219 Nicotine dependence, cigarettes, with unspecified nicotine-induced disorders: Secondary | ICD-10-CM

## 2019-11-05 NOTE — Patient Instructions (Signed)
Living With Depression Everyone experiences occasional disappointment, sadness, and loss in their lives. When you are feeling down, blue, or sad for at least 2 weeks in a row, it may mean that you have depression. Depression can affect your thoughts and feelings, relationships, daily activities, and physical health. It is caused by changes in the way your brain functions. If you receive a diagnosis of depression, your health care provider will tell you which type of depression you have and what treatment options are available to you. If you are living with depression, there are ways to help you recover from it and also ways to prevent it from coming back. How to cope with lifestyle changes Coping with stress     Stress is your body's reaction to life changes and events, both good and bad. Stressful situations may include:  Getting married.  The death of a spouse.  Losing a job.  Retiring.  Having a baby. Stress can last just a few hours or it can be ongoing. Stress can play a major role in depression, so it is important to learn both how to cope with stress and how to think about it differently. Talk with your health care provider or a counselor if you would like to learn more about stress reduction. He or she may suggest some stress reduction techniques, such as:  Music therapy. This can include creating music or listening to music. Choose music that you enjoy and that inspires you.  Mindfulness-based meditation. This kind of meditation can be done while sitting or walking. It involves being aware of your normal breaths, rather than trying to control your breathing.  Centering prayer. This is a kind of meditation that involves focusing on a spiritual word or phrase. Choose a word, phrase, or sacred image that is meaningful to you and that brings you peace.  Deep breathing. To do this, expand your stomach and inhale slowly through your nose. Hold your breath for 3-5 seconds, then exhale  slowly, allowing your stomach muscles to relax.  Muscle relaxation. This involves intentionally tensing muscles then relaxing them. Choose a stress reduction technique that fits your lifestyle and personality. Stress reduction techniques take time and practice to develop. Set aside 5-15 minutes a day to do them. Therapists can offer training in these techniques. The training may be covered by some insurance plans. Other things you can do to manage stress include:  Keeping a stress diary. This can help you learn what triggers your stress and ways to control your response.  Understanding what your limits are and saying no to requests or events that lead to a schedule that is too full.  Thinking about how you respond to certain situations. You may not be able to control everything, but you can control how you react.  Adding humor to your life by watching funny films or TV shows.  Making time for activities that help you relax and not feeling guilty about spending your time this way.  Medicines Your health care provider may suggest certain medicines if he or she feels that they will help improve your condition. Avoid using alcohol and other substances that may prevent your medicines from working properly (may interact). It is also important to:  Talk with your pharmacist or health care provider about all the medicines that you take, their possible side effects, and what medicines are safe to take together.  Make it your goal to take part in all treatment decisions (shared decision-making). This includes giving input on   the side effects of medicines. It is best if shared decision-making with your health care provider is part of your total treatment plan. If your health care provider prescribes a medicine, you may not notice the full benefits of it for 4-8 weeks. Most people who are treated for depression need to be on medicine for at least 6-12 months after they feel better. If you are taking  medicines as part of your treatment, do not stop taking medicines without first talking to your health care provider. You may need to have the medicine slowly decreased (tapered) over time to decrease the risk of harmful side effects. Relationships Your health care provider may suggest family therapy along with individual therapy and drug therapy. While there may not be family problems that are causing you to feel depressed, it is still important to make sure your family learns as much as they can about your mental health. Having your family's support can help make your treatment successful. How to recognize changes in your condition Everyone has a different response to treatment for depression. Recovery from major depression happens when you have not had signs of major depression for two months. This may mean that you will start to:  Have more interest in doing activities.  Feel less hopeless than you did 2 months ago.  Have more energy.  Overeat less often, or have better or improving appetite.  Have better concentration. Your health care provider will work with you to decide the next steps in your recovery. It is also important to recognize when your condition is getting worse. Watch for these signs:  Having fatigue or low energy.  Eating too much or too little.  Sleeping too much or too little.  Feeling restless, agitated, or hopeless.  Having trouble concentrating or making decisions.  Having unexplained physical complaints.  Feeling irritable, angry, or aggressive. Get help as soon as you or your family members notice these symptoms coming back. How to get support and help from others How to talk with friends and family members about your condition  Talking to friends and family members about your condition can provide you with one way to get support and guidance. Reach out to trusted friends or family members, explain your symptoms to them, and let them know that you are  working with a health care provider to treat your depression. Financial resources Not all insurance plans cover mental health care, so it is important to check with your insurance carrier. If paying for co-pays or counseling services is a problem, search for a local or county mental health care center. They may be able to offer public mental health care services at low or no cost when you are not able to see a private health care provider. If you are taking medicine for depression, you may be able to get the generic form, which may be less expensive. Some makers of prescription medicines also offer help to patients who cannot afford the medicines they need. Follow these instructions at home:   Get the right amount and quality of sleep.  Cut down on using caffeine, tobacco, alcohol, and other potentially harmful substances.  Try to exercise, such as walking or lifting small weights.  Take over-the-counter and prescription medicines only as told by your health care provider.  Eat a healthy diet that includes plenty of vegetables, fruits, whole grains, low-fat dairy products, and lean protein. Do not eat a lot of foods that are high in solid fats, added sugars, or salt.    Keep all follow-up visits as told by your health care provider. This is important. Contact a health care provider if:  You stop taking your antidepressant medicines, and you have any of these symptoms: ? Nausea. ? Headache. ? Feeling lightheaded. ? Chills and body aches. ? Not being able to sleep (insomnia).  You or your friends and family think your depression is getting worse. Get help right away if:  You have thoughts of hurting yourself or others. If you ever feel like you may hurt yourself or others, or have thoughts about taking your own life, get help right away. You can go to your nearest emergency department or call:  Your local emergency services (911 in the [place]  A suicide crisis helpline, such as the  National Suicide Prevention Lifeline at 1-800-273-8255. This is open 24-hours a day. Summary  If you are living with depression, there are ways to help you recover from it and also ways to prevent it from coming back.  Work with your health care team to create a management plan that includes counseling, stress management techniques, and healthy lifestyle habits. This information is not intended to replace advice given to you by your health care provider. Make sure you discuss any questions you have with your health care provider. Document Revised: 01/01/2019 Document Reviewed: 08/12/2016 Elsevier Patient Education  2020 [employer]  

## 2019-11-05 NOTE — Assessment & Plan Note (Signed)
Chronic, progressive with ongoing heavy alcohol use.  He is not interested in rehab or quitting alcohol use at this time.  Continue collaboration with GI and palliative, discussed with palliative NP after visit and will place hospice referral after patient has tunnel peritoneal drain placed.  At length discussion with patient on disease process and progression of disease.  Continue current medication regimen and ongoing goals of care discussions.  Return in 4 weeks.

## 2019-11-05 NOTE — Assessment & Plan Note (Signed)
Continue to monitor, poor surgical candidate.

## 2019-11-05 NOTE — Assessment & Plan Note (Signed)
Recommend cut back and work towards complete cessation, patient is not interested and not interested in rehab of AA.

## 2019-11-05 NOTE — Assessment & Plan Note (Signed)
Spent >10 minutes this visit discussing hospice and goals of care with patient.  Discussed progressive nature of cirrhosis, especially in presence of ongoing heavy alcohol use.

## 2019-11-05 NOTE — Assessment & Plan Note (Signed)
I have recommended complete cessation of tobacco use. I have discussed various options available for assistance with tobacco cessation including over the counter methods (Nicotine gum, patch and lozenges). We also discussed prescription options (Chantix, Nicotine Inhaler / Nasal Spray). The patient is not interested in pursuing any prescription tobacco cessation options at this time.  
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
## 2019-11-05 NOTE — Assessment & Plan Note (Signed)
Chronic, ongoing with stable BP today.  Continue current medication regimen for cirrhosis, which also benefits BP.  Return in 4 weeks and will obtain labs for kidney function.
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
## 2019-11-06 ENCOUNTER — Other Ambulatory Visit: Payer: Self-pay | Admitting: Gastroenterology

## 2019-11-08 ENCOUNTER — Telehealth: Payer: Self-pay | Admitting: Primary Care

## 2019-11-08 ENCOUNTER — Encounter: Payer: Self-pay | Admitting: Nurse Practitioner

## 2019-11-08 ENCOUNTER — Other Ambulatory Visit: Payer: Self-pay | Admitting: Internal Medicine

## 2019-11-08 ENCOUNTER — Other Ambulatory Visit: Payer: Self-pay

## 2019-11-08 DIAGNOSIS — K7031 Alcoholic cirrhosis of liver with ascites: Secondary | ICD-10-CM

## 2019-11-08 NOTE — Telephone Encounter (Signed)
I spoke on the phone with POA [name] regarding updated plan. We discussed hospice being for people with life limiting disease with under six months likely prognosis,  and that the recommendation was that this was a  reasonable care option for Frederick Cabrera given his stage of disease.    I let her know that IR is willing to insert a tunnel catheter for a home ascites drainage. This needs to be done before a hospice admission so hopefully it can be done Thursday during his regularly scheduled time for paracentesis. I explained the process, that this would be done under sedation per specialist's note,  and that hospice nurses would then be able to help with the catheter and teach the family how to do the drainage procedure. We discussed increase risk of infection. [name] declined to elect "do not resuscitate" at this time; we have had  several discussions about goals of care and advance directives.I let her know that this would be an ongoing discussion as Frederick Cabrera decreased in function and his liver disease worsened. She said that they will be fine to revisit it at a later date. She voice need for DME which I will pass along to hospice referral. Depending on when the IR can be scheduled,  Hospice can admit this week, the  next day

## 2019-11-11 ENCOUNTER — Ambulatory Visit: Payer: Medicaid Other

## 2019-11-12 ENCOUNTER — Other Ambulatory Visit: Payer: Self-pay

## 2019-11-12 ENCOUNTER — Ambulatory Visit
Admission: RE | Admit: 2019-11-12 | Discharge: 2019-11-12 | Disposition: A | Discharge status: Home / Self Care | Payer: Medicaid Other | Source: Ambulatory Visit | Attending: Gastroenterology | Admitting: Gastroenterology

## 2019-11-12 ENCOUNTER — Ambulatory Visit: Payer: Medicaid Other

## 2019-11-12 DIAGNOSIS — K7031 Alcoholic cirrhosis of liver with ascites: Secondary | ICD-10-CM | POA: Diagnosis not present

## 2019-11-12 MED ORDER — ALBUMIN HUMAN 25 % IV SOLN
INTRAVENOUS | Status: AC
  Administered 2019-11-12: 25 g via INTRAVENOUS
  Filled 2019-11-12: qty 200

## 2019-11-12 MED ORDER — ALBUMIN HUMAN 25 % IV SOLN: 12.5000 g | Freq: Once | INTRAVENOUS | Status: DC

## 2019-11-12 MED ORDER — ALBUMIN HUMAN 25 % IV SOLN
50.0000 g | Freq: Once | INTRAVENOUS | Status: AC
  Administered 2019-11-12: 13:00:00 25 g via INTRAVENOUS

## 2019-11-12 NOTE — Procedures (Signed)
PROCEDURE SUMMARY:  Successful US guided paracentesis from right lateral abdomen.  Yielded 7.0 liters of yellow fluid.  No immediate complications.  Pt tolerated well.   Specimen was sent for labs.  EBL < 78mL  Hoyt Koch PA-C 11/12/2019 11:37 AM

## 2019-11-13 ENCOUNTER — Other Ambulatory Visit: Payer: Self-pay | Admitting: Gastroenterology

## 2019-11-15 NOTE — Telephone Encounter (Signed)
[place]

## 2019-11-16 ENCOUNTER — Other Ambulatory Visit: Payer: Self-pay | Admitting: Radiology

## 2019-11-16 ENCOUNTER — Telehealth: Payer: Self-pay | Admitting: Primary Care

## 2019-11-16 NOTE — Telephone Encounter (Signed)
T/c to family to inquire about how patient is doing. [facility] called and made an appt. and then did not meet the telemed appointment. He seems to have an [facility] appt today at 2 pm. He has paracentesis cath placement scheduled for tomorrow, and we have requested hospice admission the next day for oversight of drain, personal care and psychosocial concerns. Sister in law reports daily decline.

## 2019-11-17 ENCOUNTER — Telehealth: Payer: Self-pay | Admitting: Primary Care

## 2019-11-17 ENCOUNTER — Other Ambulatory Visit: Payer: Self-pay

## 2019-11-17 ENCOUNTER — Ambulatory Visit
Admission: RE | Admit: 2019-11-17 | Discharge: 2019-11-17 | Disposition: A | Discharge status: Home / Self Care | Payer: Medicaid Other | Source: Ambulatory Visit | Attending: Gastroenterology | Admitting: Gastroenterology

## 2019-11-17 ENCOUNTER — Telehealth: Payer: Self-pay | Admitting: Nurse Practitioner

## 2019-11-17 DIAGNOSIS — K7031 Alcoholic cirrhosis of liver with ascites: Secondary | ICD-10-CM

## 2019-11-17 DIAGNOSIS — F17210 Nicotine dependence, cigarettes, uncomplicated: Secondary | ICD-10-CM | POA: Insufficient documentation

## 2019-11-17 DIAGNOSIS — I10 Essential (primary) hypertension: Secondary | ICD-10-CM | POA: Insufficient documentation

## 2019-11-17 DIAGNOSIS — Z885 Allergy status to narcotic agent status: Secondary | ICD-10-CM | POA: Diagnosis not present

## 2019-11-17 DIAGNOSIS — Z79899 Other long term (current) drug therapy: Secondary | ICD-10-CM | POA: Insufficient documentation

## 2019-11-17 DIAGNOSIS — Z8249 Family history of ischemic heart disease and other diseases of the circulatory system: Secondary | ICD-10-CM | POA: Diagnosis not present

## 2019-11-17 LAB — CBC
HCT: 31.6 % — ABNORMAL LOW (ref 39.0–52.0)
Hemoglobin: 11.6 g/dL — ABNORMAL LOW (ref 13.0–17.0)
MCH: 35.9 pg — ABNORMAL HIGH (ref 26.0–34.0)
MCHC: 36.7 g/dL — ABNORMAL HIGH (ref 30.0–36.0)
MCV: 97.8 fL (ref 80.0–100.0)
Platelets: 51 10*3/uL — ABNORMAL LOW (ref 150–400)
RBC: 3.23 MIL/uL — ABNORMAL LOW (ref 4.22–5.81)
RDW: 16.4 % — ABNORMAL HIGH (ref 11.5–15.5)
WBC: 7.6 10*3/uL (ref 4.0–10.5)
nRBC: 0 % (ref 0.0–0.2)

## 2019-11-17 LAB — PROTIME-INR
INR: 1.4 — ABNORMAL HIGH (ref 0.8–1.2)
Prothrombin Time: 17.4 seconds — ABNORMAL HIGH (ref 11.4–15.2)

## 2019-11-17 MED ORDER — SODIUM CHLORIDE 0.9 % IV SOLN: INTRAVENOUS | Status: DC

## 2019-11-17 MED ORDER — CEFAZOLIN SODIUM-DEXTROSE 2-4 GM/100ML-% IV SOLN
INTRAVENOUS | Status: AC
  Filled 2019-11-17: qty 100

## 2019-11-17 MED ORDER — ALBUMIN HUMAN 25 % IV SOLN
INTRAVENOUS | Status: AC
  Administered 2019-11-17: 25 g via INTRAVENOUS
  Filled 2019-11-17: qty 100

## 2019-11-17 MED ORDER — FENTANYL CITRATE (PF) 100 MCG/2ML IJ SOLN
INTRAMUSCULAR | Status: AC
  Filled 2019-11-17: qty 2

## 2019-11-17 MED ORDER — ALBUMIN HUMAN 25 % IV SOLN
25.0000 g | Freq: Once | INTRAVENOUS | Status: AC
  Filled 2019-11-17: qty 100

## 2019-11-17 MED ORDER — IOHEXOL 300 MG/ML  SOLN: 50.0000 mL | Freq: Once | INTRAMUSCULAR | Status: DC | PRN

## 2019-11-17 MED ORDER — CEFAZOLIN SODIUM-DEXTROSE 2-4 GM/100ML-% IV SOLN: 2.0000 g | INTRAVENOUS | Status: DC

## 2019-11-17 MED ORDER — MIDAZOLAM HCL 2 MG/2ML IJ SOLN
INTRAMUSCULAR | Status: AC
  Filled 2019-11-17: qty 2

## 2019-11-17 NOTE — Telephone Encounter (Signed)
T/c and emails to hospice team to coordinate care and transfer from palliative medicine. Discussed with SW supervisor Elease Etienne RE psychosocial needs. Also was informed by Telford Nab SW that patient will receive [facility] mental health services while on hospice.

## 2019-11-17 NOTE — Progress Notes (Signed)
Message sent to K. Katrinka Blazing NP confirming Pt appointment tomorrow with hospice to manage Pleurx and care. Per NP Hospice is coming to Pt home tomorrow. Pt updated , will be sent home with two pleurx packs and instructions.
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
## 2019-11-17 NOTE — Procedures (Signed)
Interventional Radiology Procedure Note  Procedure: Placement of a tunneled peritoneal drain.   Complications: None  Estimated Blood Loss: None  Recommendations: - DC home    Signed,  Sterling Big, MD

## 2019-11-17 NOTE — Progress Notes (Signed)
4200 ml of yellow cloudy fluid from pleurx drain. Dr. Archer Asa notified and ok for 25 g Albumin IV.

## 2019-11-17 NOTE — Telephone Encounter (Signed)
Copied from CRM [id number]. Topic: General - Other >> Nov 17, 2019  1:16 PM Mcneil, Ja-Kwan wrote: Reason for CRM: Hadassah Pais with [facility] called to requests a referral for hospice care and to ask if Aura Dials would be the attending provider. Danford Bad asked that the call be returned today because they plan to see the patient tomorrow. Cb# [phone] Please ask to speak with either Hadassah Pais or Janalyn Harder
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
## 2019-11-17 NOTE — Telephone Encounter (Signed)
Left message for Frederick Cabrera, was able to speak to Frederick Cabrera and sent order for hospice and gave verbal to serve as attending.

## 2019-11-17 NOTE — Progress Notes (Signed)
Pt initially having trouble voiding, attempted while lying down and standing up without success. Dr. Archer Asa noitified and suggested bladder scan if he could not void after walking to the bathroom . Pt assisted to bathroom successfully voided. MD updated, Pt Ok for D/C

## 2019-11-18 ENCOUNTER — Ambulatory Visit: Admission: RE | Admit: 2019-11-18 | Payer: Medicaid Other | Source: Ambulatory Visit

## 2019-11-19 ENCOUNTER — Other Ambulatory Visit: Payer: Self-pay | Admitting: Gastroenterology

## 2019-11-19 ENCOUNTER — Telehealth: Payer: Self-pay | Admitting: Nurse Practitioner

## 2019-11-19 MED ORDER — TRAMADOL HCL 50 MG PO TABS: 50.0000 mg | ORAL_TABLET | Freq: Two times a day (BID) | ORAL | 2 refills | Status: DC | PRN

## 2019-11-19 MED ORDER — ALPRAZOLAM 0.25 MG PO TABS: 0.2500 mg | ORAL_TABLET | Freq: Three times a day (TID) | ORAL | 2 refills | Status: DC | PRN

## 2019-11-19 NOTE — Addendum Note (Signed)
Addended by: Aura Dials T on: 11/19/2019 01:00 PM   Modules accepted: Orders

## 2019-11-19 NOTE — Telephone Encounter (Addendum)
Orders called to triage nurse.  Had discussion with NP Katrinka Blazing, palliative NP, and Dr. Tobi Bastos about PleurX.  Following orders provided:  - Access PleurX drainage system using sterile technique 4 times a week as needed for discomfort from ascites.  Do not remove more than 1 L per drainage. - Pain: Tramadol 50 MG by mouth Q12H as needed -- spoke to [place], DPR, on phone and she believes he has taken this before without issue -- can not take Codeine - Anxiety: Xanax 0.25 MG by mouth TID as needed.  Spoke to [place] on phone and she is aware of orders and plan.  Aware to watch patient carefully with Tramadol and if any reaction do not give and immediately take to be seen.

## 2019-11-19 NOTE — Telephone Encounter (Signed)
Copied from CRM [id number]. Topic: General - Other >> Nov 18, 2019 11:33 AM Dalphine Handing A wrote: Misty Stanley needs additional orders for medication for pain ,patient also needs lorezpam medication and orders for how Micai Apolinar wants them to drain the abdomen . Best contact [phone]. Misty Stanley asks that order be given to the triage nurse at that number.

## 2019-11-22 ENCOUNTER — Telehealth: Payer: Self-pay | Admitting: Nurse Practitioner

## 2019-11-22 NOTE — Telephone Encounter (Signed)
Home Health Verbal Orders - Caller/Agency: Larene Pickett Number: [phone]  Requesting OT/PT/Skilled Nursing/Social Work/Speech Therapy: Melia with hospice has some questions about comfort care orders for pt
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
## 2019-11-23 ENCOUNTER — Telehealth: Payer: Self-pay | Admitting: Nurse Practitioner

## 2019-11-23 ENCOUNTER — Encounter: Payer: Self-pay | Admitting: Nurse Practitioner

## 2019-11-23 NOTE — Telephone Encounter (Signed)
[facility] called and is requesting to know if its okay to send the morphine in the comfort kit for pt due to his allergy. Please advise.    [phone]

## 2019-11-23 NOTE — Telephone Encounter (Signed)
Routing to provider to advise.  

## 2019-11-23 NOTE — Telephone Encounter (Signed)
Frederick Cabrera, with Hospice calling regarding patients plurex drainage tube. She inquired if she can get orders for drainage of tube x4 a week and as needed. She also wanted to note that she could not get any fluid from patient on Friday while he was sitting up in char. Patient refused drainage on Saturday and Sunday and then had site drained on Monday.    [phone]

## 2019-11-23 NOTE — Telephone Encounter (Signed)
Pharmacy notified.

## 2019-11-23 NOTE — Telephone Encounter (Signed)
Spoke to [place], hospice RN, via phone and provided orders for draining x 4 a week and as needed, no more than 2 L per drainage.

## 2019-11-23 NOTE — Telephone Encounter (Signed)
Please alert them they may send and caregiver ([name]) is aware to monitor.  He has taken other pain medications in past, but codeine caused N&V.  Will need to monitor for nausea and vomiting with Morphine, which caregiver is aware to monitor for with pain medications.

## 2019-11-25 ENCOUNTER — Other Ambulatory Visit: Payer: Medicaid Other | Admitting: Primary Care

## 2019-11-25 ENCOUNTER — Ambulatory Visit: Payer: Medicaid Other

## 2019-11-26 NOTE — Telephone Encounter (Signed)
Last office visit 11/17/2019 0 refills

## 2019-11-30 ENCOUNTER — Telehealth: Payer: Self-pay | Admitting: Nurse Practitioner

## 2019-11-30 ENCOUNTER — Telehealth: Payer: Self-pay

## 2019-11-30 NOTE — Telephone Encounter (Signed)
Copied from CRM [id number]. Topic: General - Other >> Nov 30, 2019  2:48 PM Jaquita Rector A wrote: Reason for CRM: Jimmie Molly with [place] [employer] protective services called to ask Aura Dials if when patient was last seen in the office, Were there any concerns that may have been concerned about that was centered around patient and the care he may be getting at home? Please call Ph# [phone]   Routing to provider.

## 2019-11-30 NOTE — Telephone Encounter (Signed)
Erie Noe, RN with [facility], called in stating patient has not been doing too well with his abdominal pain. States patient is taking 1 tramadol every 12 hours and it is not assisting. Erie Noe would like to know if PCP can call in another prescription for patient to help with pain. Please advise. Call back is [phone]
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
## 2019-11-30 NOTE — Telephone Encounter (Signed)
Spoke to Frederick Cabrera, Armed forces technical officer.  Will try splitting Tramadol, taking 25 MG Q6H -- 4 times a day he can have this.  May offer better relief throughout daytime.  Pain is from ascites and abdomen, they are draining 1500 ml each time.  She reports he does realize he is dying and does not want to go to hospital anymore, wants to stay at home.  Continue to drink heavily.  If poor response from Tramadol could consider Morphine or Fentanyl patch.

## 2019-12-03 ENCOUNTER — Telehealth: Payer: Self-pay | Admitting: Nurse Practitioner

## 2019-12-03 ENCOUNTER — Telehealth (INDEPENDENT_AMBULATORY_CARE_PROVIDER_SITE_OTHER): Payer: Medicaid Other | Admitting: Nurse Practitioner

## 2019-12-03 ENCOUNTER — Encounter: Payer: Self-pay | Admitting: Nurse Practitioner

## 2019-12-03 ENCOUNTER — Ambulatory Visit: Payer: Medicaid Other | Admitting: Nurse Practitioner

## 2019-12-03 ENCOUNTER — Other Ambulatory Visit: Payer: Self-pay | Admitting: Gastroenterology

## 2019-12-03 DIAGNOSIS — K7031 Alcoholic cirrhosis of liver with ascites: Secondary | ICD-10-CM | POA: Diagnosis not present

## 2019-12-03 DIAGNOSIS — F200 Paranoid schizophrenia: Secondary | ICD-10-CM

## 2019-12-03 MED ORDER — TRAMADOL HCL 50 MG PO TABS: 25.0000 mg | ORAL_TABLET | Freq: Four times a day (QID) | ORAL | 2 refills | Status: DC

## 2019-12-03 NOTE — Assessment & Plan Note (Signed)
Ongoing, with history of past treatment by psychiatry.  Denies SI/HI.  Continue collaboration with hospice and Xanax TID.  Is receiving pastoral care via hospice which has been beneficial.  Continue ongoing goals of care discussions and comfort level care.

## 2019-12-03 NOTE — Assessment & Plan Note (Signed)
Chronic, progressive with ongoing heavy alcohol use.  Continue collaboration with GI and hospice.  At length discussion with patient on disease process and progression of disease.  Continue current medication regimen and ongoing goals of care discussions.  Continue Tramadol for comfort, new script sent for 25 MG QID dosing.  Return in 6 weeks.

## 2019-12-03 NOTE — Telephone Encounter (Signed)
I spoke with Frederick Cabrera she said he can do virtual today, I changed it to virtual

## 2019-12-03 NOTE — Patient Instructions (Signed)

## 2019-12-03 NOTE — Telephone Encounter (Signed)
Copied from CRM [id number]. Topic: Appointment Scheduling - Scheduling Inquiry for Clinic >> Dec 03, 2019  1:29 PM Deborha Payment wrote: Reason for CRM: Patients caretaker Gray Bernhardt stating patient cannot make appt today at 3pm and would like to change to virtual appt.  Patient states his health is bad and cannot get up for appt. Call back [phone] >> Dec 03, 2019  2:23 PM Harriet Pho wrote: Changed appt to virtual

## 2019-12-03 NOTE — Progress Notes (Signed)
There were no vitals taken for this visit.   Subjective:    Patient ID: Frederick Cabrera, male    DOB: Jul 12, 1966, 54 y.o.   MRN: [id number]  HPI: Frederick Cabrera is a 54 y.o. male  Chief Complaint  Patient presents with  . Anxiety  . Cirrhosis    . This visit was completed via MyChart due to the restrictions of the COVID-19 pandemic. All issues as above were discussed and addressed. Physical exam was done as above through visual confirmation on MyChart. If it was felt that the patient should be evaluated in the office, they were directed there. The patient verbally consented to this visit. . Location of the patient: home . Location of the provider: work . Those involved with this call:  . Provider: Marnee Guarneri, DNP . CMA: Yvonna Alanis, CMA . Front Desk/Registration: Don Perking  . Time spent on call: 15 minutes with patient face to face via video conference. More than 50% of this time was spent in counseling and coordination of care. 10 minutes total spent in review of patient's record and preparation of their chart.  . I verified patient identity using two factors (patient name and date of birth). Patient consents verbally to being seen via telemedicine visit today.   [name], his helper and friend, is present to assist with HPI.  ANXIETY/STRESS Being followed by hospice at this time.  Taking Xanax 0.25 MG TID as needed.  [name] reports that he has not been anxious, is sleeping a lot.  Discussed with them both the stages of cirrhosis and hospice care.   Duration:stable Anxious mood: at times Excessive worrying: at times Irritability: no  Sweating: no Nausea: no Palpitations:no Hyperventilation: no Panic attacks: no Agoraphobia: no  Obscessions/compulsions: no Depressed mood: no Depression screen [facility] 2/9 12/03/2019 11/05/2019  Decreased Interest 1 1  Down, Depressed, Hopeless 3 3  PHQ - 2 Score 4 4  Altered sleeping 3 3  Tired, decreased energy 3 3  Change in  appetite 3 1  Feeling bad or failure about yourself  3 3  Trouble concentrating 2 1  Moving slowly or fidgety/restless 0 2  Suicidal thoughts 0 0  PHQ-9 Score 18 17  Difficult doing work/chores - Very difficult   Anhedonia: no Weight changes: no Insomnia: yes hard to fall asleep  Hypersomnia: no Fatigue/loss of energy: yes Feelings of worthlessness: no Feelings of guilt: no Impaired concentration/indecisiveness: yes Suicidal ideations: no  Crying spells: no Recent Stressors/Life Changes: no   Relationship problems: no   Family stress: no     Financial stress: no    Job stress: no    Recent death/loss: no GAD 7 : Generalized Anxiety Score 12/03/2019  Nervous, Anxious, on Edge 3  Control/stop worrying 1  Worry too much - different things 3  Trouble relaxing 3  Restless 3  Easily annoyed or irritable 3  Afraid - awful might happen 3  Total GAD 7 Score 19   CIRRHOSIS: Currently under hospice care and has PleurX drained on visits, did not "get hardly anything yesterday" per [name], but per hospice nurse this week they have been pulling about 1500 ml at times.  Has umbilical hernia and hospice nurse is placing bandage over area due to small area of abrasion, [name] reports this looks smaller and improving. Unable to view on visit today due to poor visual connection.  Patient continues to ask if he is dying, discussed with patient and [name] stages of cirrhosis.  He reports he "  wish I could stop drinking"; however [name] reports he continues to drink heavily daily.  Is aware this is a progressive disease and currently is a DNR, hospice nurse reported this week he has discussed how he does not want to go to hospital and wishes to die at home.  He reiterates this today.  [name] reports he is comfortable at home, changing his Tramadol to 25 MG QID vs current 50 MG BID dosing to allow for pain control throughout day.  No N&V, constipation.  Does have occasional abdominal pain and loose  stools.  Relevant past medical, surgical, family and social history reviewed and updated as indicated. Interim medical history since our last visit reviewed. Allergies and medications reviewed and updated.  Review of Systems  Constitutional: Positive for fatigue. Negative for activity change, appetite change, diaphoresis and fever.  Respiratory: Negative for cough, chest tightness, shortness of breath and wheezing.   Cardiovascular: Negative for chest pain, palpitations and leg swelling.  Gastrointestinal: Positive for abdominal distention, abdominal pain (occasional) and diarrhea (occasional). Negative for constipation, nausea, rectal pain and vomiting.  Endocrine: Negative.   Neurological: Positive for weakness. Negative for dizziness, syncope, light-headedness, numbness and headaches.  Psychiatric/Behavioral: Positive for decreased concentration and sleep disturbance. Negative for self-injury and suicidal ideas. The patient is nervous/anxious.     Per HPI unless specifically indicated above     Objective:    There were no vitals taken for this visit.  Wt Readings from Last 3 Encounters:  11/17/19 193 lb 9 oz (87.8 kg)  11/05/19 193 lb 9.6 oz (87.8 kg)  10/26/19 150 lb (68 kg)    Physical Exam Vitals and nursing note reviewed.  Constitutional:      General: He is awake. He is not in acute distress.    Appearance: He is well-developed. He is ill-appearing.  HENT:     Head: Normocephalic.     Right Ear: Hearing normal. No drainage.     Left Ear: Hearing normal. No drainage.  Eyes:     General: Lids are normal.        Right eye: No discharge.        Left eye: No discharge.     Conjunctiva/sclera: Conjunctivae normal.  Pulmonary:     Effort: Pulmonary effort is normal. No accessory muscle usage or respiratory distress.  Musculoskeletal:     Cervical back: Normal range of motion.  Neurological:     Mental Status: He is alert and oriented to person, place, and time.   Psychiatric:        Mood and Affect: Mood normal.        Behavior: Behavior normal. Behavior is cooperative.        Thought Content: Thought content normal.        Judgment: Judgment normal.     Results for orders placed or performed during the hospital encounter of 11/17/19  CBC  Result Value Ref Range   WBC 7.6 4.0 - 10.5 K/uL   RBC 3.23 (L) 4.22 - 5.81 MIL/uL   Hemoglobin 11.6 (L) 13.0 - 17.0 g/dL   HCT 08.8 (L) 11.0 - 31.5 %   MCV 97.8 80.0 - 100.0 fL   MCH 35.9 (H) 26.0 - 34.0 pg   MCHC 36.7 (H) 30.0 - 36.0 g/dL   RDW 94.5 (H) 85.9 - 29.2 %   Platelets 51 (L) 150 - 400 K/uL   nRBC 0.0 0.0 - 0.2 %  Protime-INR  Result Value Ref Range   Prothrombin Time  17.4 (H) 11.4 - 15.2 seconds   INR 1.4 (H) 0.8 - 1.2      Assessment & Plan:   Problem List Items Addressed This Visit      Digestive   Alcoholic cirrhosis of liver with ascites (HCC) - Primary    Chronic, progressive with ongoing heavy alcohol use.  Continue collaboration with GI and hospice.  At length discussion with patient on disease process and progression of disease.  Continue current medication regimen and ongoing goals of care discussions.  Continue Tramadol for comfort, new script sent for 25 MG QID dosing.  Return in 6 weeks.        Other   Paranoid schizophrenia (HCC)    Ongoing, with history of past treatment by psychiatry.  Denies SI/HI.  Continue collaboration with hospice and Xanax TID.  Is receiving pastoral care via hospice which has been beneficial.  Continue ongoing goals of care discussions and comfort level care.         I discussed the assessment and treatment plan with the patient. The patient was provided an opportunity to ask questions and all were answered. The patient agreed with the plan and demonstrated an understanding of the instructions.   The patient was advised to call back or seek an in-person evaluation if the symptoms worsen or if the condition fails to improve as  anticipated.   I provided 15+ minutes of time during this encounter.  Follow up plan: Return in about 6 weeks (around 01/14/2020) for Cirrhosis -- hospice.

## 2019-12-21 ENCOUNTER — Telehealth: Payer: Self-pay | Admitting: Nurse Practitioner

## 2019-12-21 ENCOUNTER — Other Ambulatory Visit: Payer: Self-pay

## 2019-12-21 DIAGNOSIS — K7031 Alcoholic cirrhosis of liver with ascites: Secondary | ICD-10-CM

## 2019-12-21 NOTE — Telephone Encounter (Signed)
Sent secure chat to Dr. Tobi Bastos, patient's GI provider, and his CMA will be working on contacting IR to get imaging of area to assess drain.  Left general compliant message with Inetta Fermo at hospice to alert her to this.

## 2019-12-21 NOTE — Telephone Encounter (Signed)
Tina, with Hospice, calling to request possible orders for an ultrasound to check placement of patients drain. She states it does not seem to be draining properly and patient is experiencing swelling. Please advise.

## 2019-12-22 ENCOUNTER — Ambulatory Visit
Admission: RE | Admit: 2019-12-22 | Discharge: 2019-12-22 | Disposition: A | Discharge status: Home / Self Care | Payer: Medicaid Other | Source: Ambulatory Visit | Attending: Gastroenterology | Admitting: Gastroenterology

## 2019-12-22 ENCOUNTER — Other Ambulatory Visit: Payer: Self-pay | Admitting: Gastroenterology

## 2019-12-22 ENCOUNTER — Other Ambulatory Visit: Payer: Self-pay

## 2019-12-22 ENCOUNTER — Telehealth: Payer: Self-pay

## 2019-12-22 DIAGNOSIS — K7031 Alcoholic cirrhosis of liver with ascites: Secondary | ICD-10-CM | POA: Insufficient documentation

## 2019-12-22 MED ORDER — ALBUMIN HUMAN 25 % IV SOLN
INTRAVENOUS | Status: AC
  Administered 2019-12-22: 09:00:00 25 g
  Filled 2019-12-22: qty 100

## 2019-12-22 MED ORDER — ALBUMIN HUMAN 25 % IV SOLN: 25.0000 g | Freq: Once | INTRAVENOUS | 0 refills | Status: AC

## 2019-12-22 NOTE — Procedures (Signed)
Interventional Radiology Procedure:   Indications: Peritoneal catheter is not working.  Recurrent ascites.   Procedure: US guided paracentesis  Findings: Removed 7000 ml from left lower quadrant  Complications: None     EBL: Less than 10 ml   Frederick Rase R. Lowella Dandy, MD  Pager: [phone]

## 2019-12-22 NOTE — Telephone Encounter (Signed)
Spoke with pt's sister-in-law [name], and informed her that pt will be scheduled to have his pleurx catheter replaced on 12-27-19 at 7:00 am. She and pt agree with this appointment date and time.

## 2019-12-24 ENCOUNTER — Other Ambulatory Visit: Payer: Self-pay | Admitting: Radiology

## 2019-12-24 NOTE — Progress Notes (Signed)
Patient on schedule for pleurx catheter exchange/placement on 12/27/2019, [name] called and is aware to be here at 0700, patient to be NPO after MN prior to procedure. Stated understanding.

## 2019-12-25 IMAGING — US US PARACENTESIS
1 series · 2 of 2 positions shown · non-contrast
Comparison: none

INDICATION: Alcoholic cirrhosis, recurrent large volume symptomatic ascites

[Series 1: us paracentesis · 0.25mm/px · 2 of 2 slices shown]
[im 1/2]
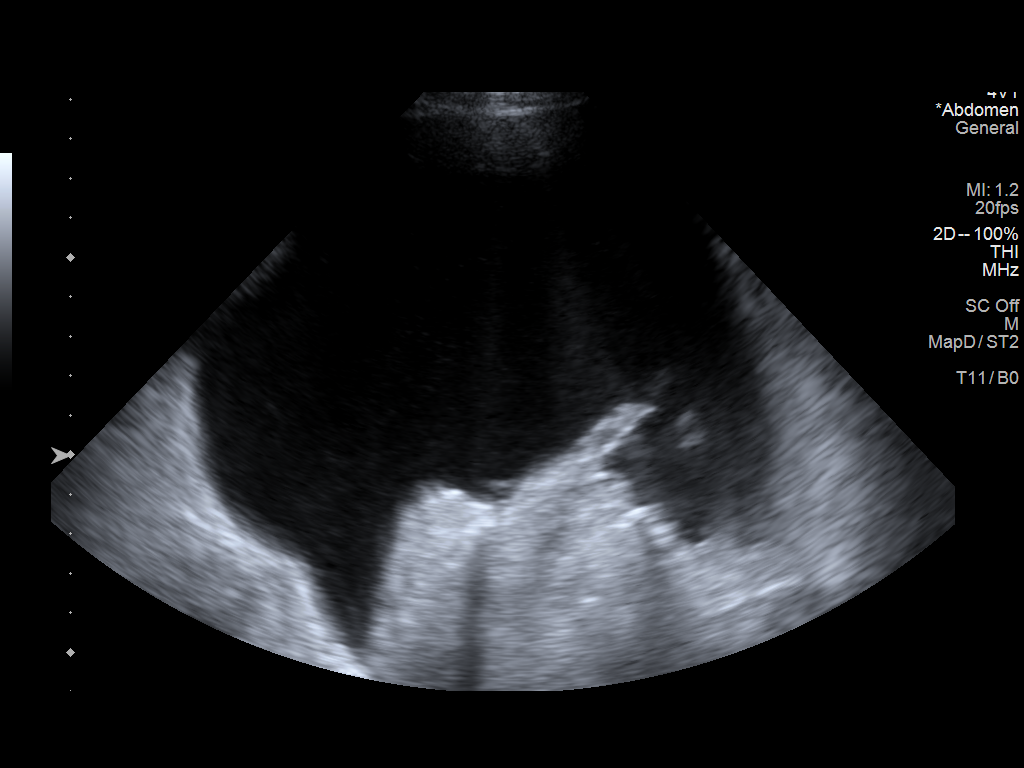
[im 2/2]
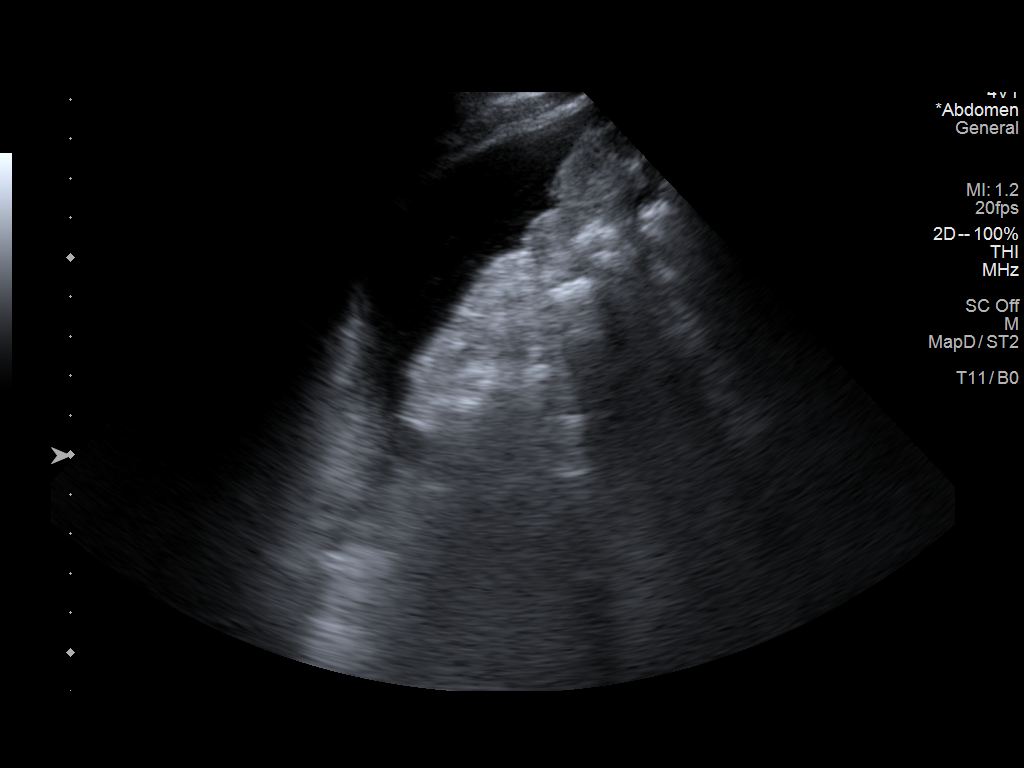

[2 of 2 positions shown; findings below may reference images not displayed]

EXAM:
ULTRASOUND GUIDED  PARACENTESIS

MEDICATIONS:
Lidocaine 1% subcutaneous

COMPLICATIONS:
None immediate.

PROCEDURE:
Informed written consent was obtained from the patient after a
discussion of the risks, benefits and alternatives to treatment. A
timeout was performed prior to the initiation of the procedure.

Initial ultrasound scanning demonstrates a large amount of ascites
within the right lower abdominal quadrant. The right lower abdomen
was prepped and draped in the usual sterile fashion. 1% lidocaine
was used for local anesthesia.

Following this, a 6 Fr Safe-T-Centesis catheter was introduced. An
ultrasound image was saved for documentation purposes. The
paracentesis was performed. The catheter was removed and a dressing
was applied. The patient tolerated the procedure well without
immediate post procedural complication.
FINDINGS: A total of approximately 7 L of clear yellow fluid was removed.
IMPRESSION: Successful ultrasound-guided paracentesis yielding 7 liters of
peritoneal fluid.

## 2019-12-27 ENCOUNTER — Other Ambulatory Visit: Payer: Self-pay

## 2019-12-27 ENCOUNTER — Other Ambulatory Visit: Payer: Self-pay | Admitting: Nurse Practitioner

## 2019-12-27 ENCOUNTER — Ambulatory Visit
Admission: RE | Admit: 2019-12-27 | Discharge: 2019-12-27 | Disposition: A | Discharge status: Home / Self Care | Payer: Medicaid Other | Source: Ambulatory Visit | Attending: Gastroenterology | Admitting: Gastroenterology

## 2019-12-27 DIAGNOSIS — K7031 Alcoholic cirrhosis of liver with ascites: Secondary | ICD-10-CM | POA: Diagnosis present

## 2019-12-27 LAB — BASIC METABOLIC PANEL
Anion gap: 11 (ref 5–15)
BUN: 36 mg/dL — ABNORMAL HIGH (ref 6–20)
CO2: 21 mmol/L — ABNORMAL LOW (ref 22–32)
Calcium: 8.5 mg/dL — ABNORMAL LOW (ref 8.9–10.3)
Chloride: 88 mmol/L — ABNORMAL LOW (ref 98–111)
Creatinine, Ser: 1.09 mg/dL (ref 0.61–1.24)
GFR calc Af Amer: >60 mL/min (ref >60)
GFR calc non Af Amer: >60 mL/min (ref >60)
Glucose, Bld: 247 mg/dL — ABNORMAL HIGH (ref 70–99)
Potassium: 5.3 mmol/L — ABNORMAL HIGH (ref 3.5–5.1)
Sodium: 120 mmol/L — ABNORMAL LOW (ref 135–145)

## 2019-12-27 LAB — CBC
HCT: 33.4 % — ABNORMAL LOW (ref 39.0–52.0)
Hemoglobin: 12.5 g/dL — ABNORMAL LOW (ref 13.0–17.0)
MCH: 36.7 pg — ABNORMAL HIGH (ref 26.0–34.0)
MCHC: 37.4 g/dL — ABNORMAL HIGH (ref 30.0–36.0)
MCV: 97.9 fL (ref 80.0–100.0)
Platelets: 88 10*3/uL — ABNORMAL LOW (ref 150–400)
RBC: 3.41 MIL/uL — ABNORMAL LOW (ref 4.22–5.81)
RDW: 16.1 % — ABNORMAL HIGH (ref 11.5–15.5)
WBC: 12.8 10*3/uL — ABNORMAL HIGH (ref 4.0–10.5)
nRBC: 0 % (ref 0.0–0.2)

## 2019-12-27 LAB — PROTIME-INR
INR: 1.5 — ABNORMAL HIGH (ref 0.8–1.2)
Prothrombin Time: 17.9 seconds — ABNORMAL HIGH (ref 11.4–15.2)

## 2019-12-27 MED ORDER — SODIUM CHLORIDE 0.9 % IV SOLN: INTRAVENOUS | Status: DC

## 2019-12-27 MED ORDER — MIDAZOLAM HCL 2 MG/2ML IJ SOLN
INTRAMUSCULAR | Status: AC | PRN
  Administered 2019-12-27: 1 mg via INTRAVENOUS

## 2019-12-27 MED ORDER — FENTANYL CITRATE (PF) 100 MCG/2ML IJ SOLN
INTRAMUSCULAR | Status: AC | PRN
  Administered 2019-12-27 (×2): 50 ug via INTRAVENOUS

## 2019-12-27 MED ORDER — FENTANYL CITRATE (PF) 100 MCG/2ML IJ SOLN
INTRAMUSCULAR | Status: AC
  Filled 2019-12-27: qty 2

## 2019-12-27 MED ORDER — TRAMADOL HCL 50 MG PO TABS: 25.0000 mg | ORAL_TABLET | Freq: Four times a day (QID) | ORAL | 2 refills | Status: AC

## 2019-12-27 MED ORDER — CEFAZOLIN SODIUM-DEXTROSE 2-4 GM/100ML-% IV SOLN
2.0000 g | Freq: Once | INTRAVENOUS | Status: AC
  Administered 2019-12-27: 2 g via INTRAVENOUS

## 2019-12-27 MED ORDER — ALPRAZOLAM 0.25 MG PO TABS: 0.2500 mg | ORAL_TABLET | Freq: Three times a day (TID) | ORAL | 2 refills | Status: DC | PRN

## 2019-12-27 MED ORDER — MIDAZOLAM HCL 5 MG/5ML IJ SOLN
INTRAMUSCULAR | Status: AC
  Filled 2019-12-27: qty 5

## 2019-12-27 NOTE — Telephone Encounter (Signed)
Medication Refill - Medication: traMADol (ULTRAM) 50 MG tablet ALPRAZolam (XANAX) 0.25 MG tablet  Has the patient contacted their pharmacy? Yes - states pharmacy hasn't gotten a response from the office.  Pt is out of medications (Agent: If no, request that the patient contact the pharmacy for the refill.) (Agent: If yes, when and what did the pharmacy advise?)  Preferred Pharmacy (with phone number or street name):  [id number] [place], [place] - [address] Phone:  [phone]  Fax:  [phone]     Agent: Please be advised that RX refills may take up to 3 business days. We ask that you follow-up with your pharmacy.

## 2019-12-27 NOTE — Telephone Encounter (Signed)
Requested medication (s) are due for refill today: Alprazolam, yes  Requested medication (s) are on the active medication list:  yes  Last refill:  11/19/19  Future visit scheduled: yes  Notes to clinic:  not delegated  Requested medication (s) are due for refill today: Ultram, yes  Requested medication (s) are on the active medication list: yes  Last refill: 12/03/19  Future visit scheduled: yes  Notes to clinic:  not delegated   Requested Prescriptions  Pending Prescriptions Disp Refills   traMADol (ULTRAM) 50 MG tablet 60 tablet 2    Sig: Take 0.5 tablets (25 mg total) by mouth 4 (four) times daily.      Not Delegated - Analgesics:  Opioid Agonists Failed - 12/27/2019 11:14 AM      Failed - This refill cannot be delegated      Passed - Urine Drug Screen completed in last 360 days.      Passed - Valid encounter within last 6 months    Recent Outpatient Visits           3 weeks ago Alcoholic cirrhosis of liver with ascites (HCC)   [facility] Lar[place], [place] T, NP   1 month ago Alcoholic cirrhosis of liver with ascites (HCC)   [facility] [place], [place] T, NP       Future Appointments             In 3 weeks Cannady, Dorie Rank, NP [facility], [facility]              ALPRAZolam (XANAX) 0.25 MG tablet 90 tablet 2    Sig: Take 1 tablet (0.25 mg total) by mouth 3 (three) times daily as needed (for severe anxiety only -- hospice patient).      Not Delegated - Psychiatry:  Anxiolytics/Hypnotics Failed - 12/27/2019 11:14 AM      Failed - This refill cannot be delegated      Passed - Urine Drug Screen completed in last 360 days.      Passed - Valid encounter within last 6 months    Recent Outpatient Visits           3 weeks ago Alcoholic cirrhosis of liver with ascites (HCC)   [facility] [place], Jolene T, NP   1 month ago Alcoholic cirrhosis of liver with ascites (HCC)   [facility] [place], Dorie Rank,  NP       Future Appointments             In 3 weeks Cannady, Dorie Rank, NP [facility], [facility]

## 2019-12-27 NOTE — Progress Notes (Signed)
Patient clinically stable post Pleurx catheter placement on left per DR Grace Isaac tolerated well. 4.6 liters off, received Versed 1mg  along with Fentanyl IV for procedure. Denies complaints at this time. Report given to RN with quesitions answered.

## 2019-12-27 NOTE — Discharge Instructions (Signed)
Moderate Conscious Sedation, Adult, Care After These instructions provide you with information about caring for yourself after your procedure. Your health care provider may also give you more specific instructions. Your treatment has been planned according to current medical practices, but problems sometimes occur. Call your health care provider if you have any problems or questions after your procedure. What can I expect after the procedure? After your procedure, it is common:  To feel sleepy for several hours.  To feel clumsy and have poor balance for several hours.  To have poor judgment for several hours.  To vomit if you eat too soon. Follow these instructions at home: For at least 24 hours after the procedure:   Do not: ? Participate in activities where you could fall or become injured. ? Drive. ? Use heavy machinery. ? Drink alcohol. ? Take sleeping pills or medicines that cause drowsiness. ? Make important decisions or sign legal documents. ? Take care of children on your own.  Rest. Eating and drinking  Follow the diet recommended by your health care provider.  If you vomit: ? Drink water, juice, or soup when you can drink without vomiting. ? Make sure you have little or no nausea before eating solid foods. General instructions  Have a responsible adult stay with you until you are awake and alert.  Take over-the-counter and prescription medicines only as told by your health care provider.  If you smoke, do not smoke without supervision.  Keep all follow-up visits as told by your health care provider. This is important. Contact a health care provider if:  You keep feeling nauseous or you keep vomiting.  You feel light-headed.  You develop a rash.  You have a fever. Get help right away if:  You have trouble breathing. This information is not intended to replace advice given to you by your health care provider. Make sure you discuss any questions you have  with your health care provider. Document Revised: 08/22/2017 Document Reviewed: 12/30/2015 Elsevier Patient Education  2020 Else[employer] Inc[employer] Ascites Drainage Catheter Home Guide An ascites drainage catheter is a thin, flexible tube that helps drain excess fluid that has collected in the abdomen (ascites). Draining the fluid can help prevent pain and keep other problems from developing. There are several kinds of ascites drainage systems. Follow general guidelines as well as instructions about your specific drainage system. Your health care provider will instruct you on:  How to use your system.  How much fluid to drain.  How often to drain the fluid. Call your health care provider if you have any questions or concerns. What are the risks? The ascites drainage catheter system is generally safe. However, problems may occur, including:  Infection.  Bleeding.  A dislodged or blocked catheter. Supplies needed:  Soap and warm water.  A mask.  Alcohol wipes.  Gauze.  Paper tape.  Drainage container.  Alcohol-based cleaner or bleach-based cleaner. How to use the catheter to drain fluid  1. Clean any surface that you will be using. 2. Wash your hands with soap and warm water. If your hands are not visibly dirty, you can use an alcohol-based skin cleanser instead. 3. Put on a mask. Make sure that it covers your nose and mouth. 4. Place all the supplies you will need on the surfaces that you have cleaned. Make sure that you can easily reach them. 5. Clean the safety valve on the end of the drainage catheter with an alcohol wipe. 6. Attach the adapter or   access tip to the safety valve on your catheter. To do this, you may need to remove a protective cap on the safety valve. 7. Release the clamps on your drainage container. 8. Allow the fluid to drain into the container. 9. Close the clamps and release the catheter from the drainage container. 10. Write down the amount of fluid that  was drained, if your health care provider has asked you to do that. 11. Follow instructions from your health care provider about collecting and transporting the fluid. Your health care provider may ask you to collect the fluid and take it to the lab to be analyzed. If you are told to dispose of the fluid, pour it down the toilet or do as told by your health care provider. 12. Clean the surfaces where any fluid was spilled with an alcohol-based or bleach-based cleaner. What should I do after I drain the fluid? 1. Wipe the end of your catheter with an alcohol wipe. 2. Put the protective cap back onto the safety valve. 3. Follow instructions from your health care provider and: ? Place gauze bandages (dressings) around the catheter area. ? Secure the catheter to your body with gauze and tape. Contact a health care provider if you have:  Skin irritation, redness, or pain where the catheter was inserted (insertion site).  Trouble draining fluid.  Trouble caring for your catheter.  Abdominal pain, bloating, or cramping. Get help right away if you:  Develop a fever or chills.  Have increasing redness or increasing pain around the insertion site.  Have blood or pus coming from the insertion site.  Notice that the fluid that drains out of the catheter becomes cloudy or has a bad smell.  Have abdominal pain or cramping that worsens or does not go away. Summary  An ascites drainage catheter is a thin, flexible tube that helps drain excess fluid that has collected in your abdomen.  There are several kinds of ascites drainage systems. Follow general guidelines as well as your health care provider's instructions about your specific drainage system.  Follow instructions from your health care provider about collecting and transporting the fluid. Your health care provider may ask you to collect the fluid and take it to the lab to be analyzed. This information is not intended to replace advice given  to you by your health care provider. Make sure you discuss any questions you have with your health care provider. Document Revised: 08/11/2018 Document Reviewed: 07/30/2017 Elsevier Patient Education  2020 Elsevier Inc.  

## 2019-12-27 NOTE — Procedures (Signed)
Pre procedural Dx: Cirrhosis, Malfunctioning tunneled peritoneal drainage catheter Post procedural Dx: Same  Technically successful image guided tunneled periteoneal drainage catheter placement into the left lower abdomen pelvis. Large volume paracentesis performed following drain placement.  Successful bedside removal of malfunctioning right lower abdominal tunneled drainage catheter.   EBL: Trace Complications: None immediate  Katherina Right, MD Pager #: [phone]

## 2019-12-27 NOTE — OR Nursing (Signed)
Sister in law called with procedure information, Erie Noe from Hospice informed that Dr Odis Luster recommended to leave a bit of fluid and not drain pt "dry". He suspects the prior site had developed adhesions which made it stop draining. EMS called for non emergent transport

## 2019-12-27 NOTE — Telephone Encounter (Signed)
Pt has an appt 01/20/20

## 2019-12-29 ENCOUNTER — Telehealth: Payer: Self-pay | Admitting: Nurse Practitioner

## 2019-12-29 NOTE — Telephone Encounter (Signed)
Routing to provider  

## 2019-12-29 NOTE — Telephone Encounter (Signed)
Erie Noe Sweger, with [facility] hosp[facility], requesting to speak with Jolene to discuss plan of action for when patient is not able to eat or drink anymore. She states patient is declining, feeling weak and has been in bed for 2 days. She states that patient is a heavy drinker is she is concerned about when he is unable to drink alcohol and worries about withdrawal. Erie Noe is requesting a medication in home for when that happens.    Vanessa#  [phone]

## 2019-12-30 ENCOUNTER — Other Ambulatory Visit: Payer: Self-pay | Admitting: Nurse Practitioner

## 2019-12-30 MED ORDER — LORAZEPAM 2 MG PO TABS: ORAL_TABLET | ORAL | 3 refills | Status: AC

## 2019-12-30 MED ORDER — MAGIC MOUTHWASH W/LIDOCAINE: 5.0000 mL | Freq: Four times a day (QID) | ORAL | 3 refills | Status: DC | PRN

## 2019-12-30 NOTE — Telephone Encounter (Addendum)
I printed a script for [facility] [place] [facility].  Spoke to [facility] RN on phone.  She reports concerns with patient decline and not eating/drinking as much, for alcohol withdrawal.  He is no longer drinking beer, but his brother is watering down and mixing liquor with ginger ale which patient continues to drink.  Erie Noe, SW, and hospice staff have discussed with patient and caregivers possibly transitioning to hospice home due to patient decline and concern for worsening affects with decline and end stage liver disease.  At this time they wish to maintain him at home, but may consider move in future.  At this time patient is incontinent of stool and urine, nursing is visiting daily.  Discussed with Erie Noe, will discontinue Xanax and start Ativan 1-2 MG Q6H PRN for anxiety or any withdrawal symptoms, will adjust as needed.  Magic Mouthwash script to be faxed to pharmacy for mouth sores.  Agree with plan for move to hospice home if ongoing decline and worsening symptoms.  PMP review performed prior to prescribing.

## 2019-12-31 ENCOUNTER — Other Ambulatory Visit: Payer: Self-pay | Admitting: Nurse Practitioner

## 2019-12-31 MED ORDER — MAGIC MOUTHWASH W/LIDOCAINE: 5.0000 mL | Freq: Four times a day (QID) | ORAL | 3 refills | Status: AC | PRN

## 2019-12-31 NOTE — Telephone Encounter (Signed)
Script faxed to [facility] [place].

## 2019-12-31 NOTE — Telephone Encounter (Signed)
Just reprinted for you:)

## 2019-12-31 NOTE — Telephone Encounter (Signed)
Prescription is not up front. Can you re-print please?

## 2020-01-03 ENCOUNTER — Telehealth: Payer: Self-pay | Admitting: Nurse Practitioner

## 2020-01-16 IMAGING — US US PARACENTESIS
1 series · 8 of 8 positions shown · non-contrast
Comparison: none

INDICATION: Cirrhosis.  Ascites.

[Series 1: us paracentesis · 0.26mm/px · 8 of 8 slices shown]
[im 1/8]
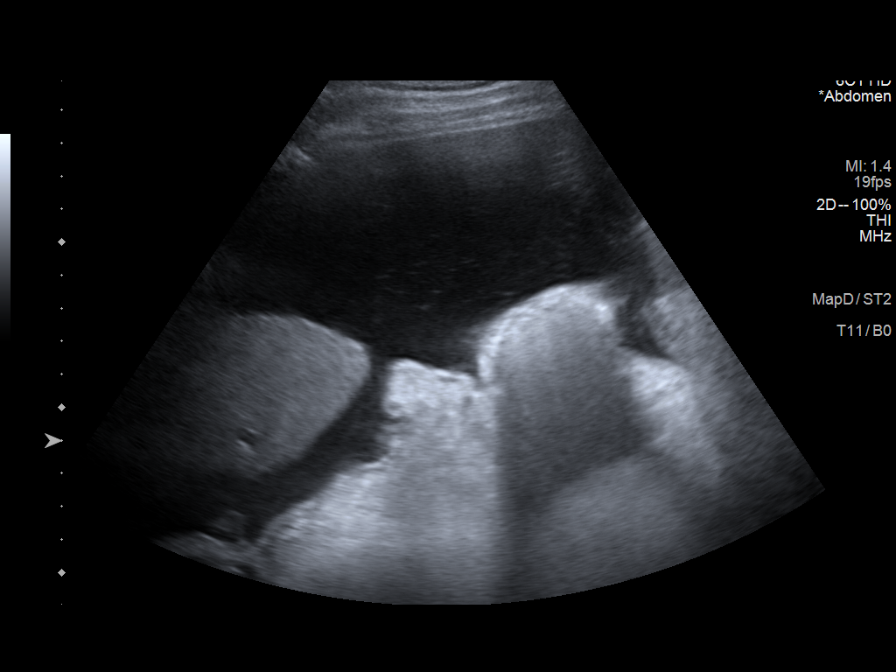
[im 2/8]
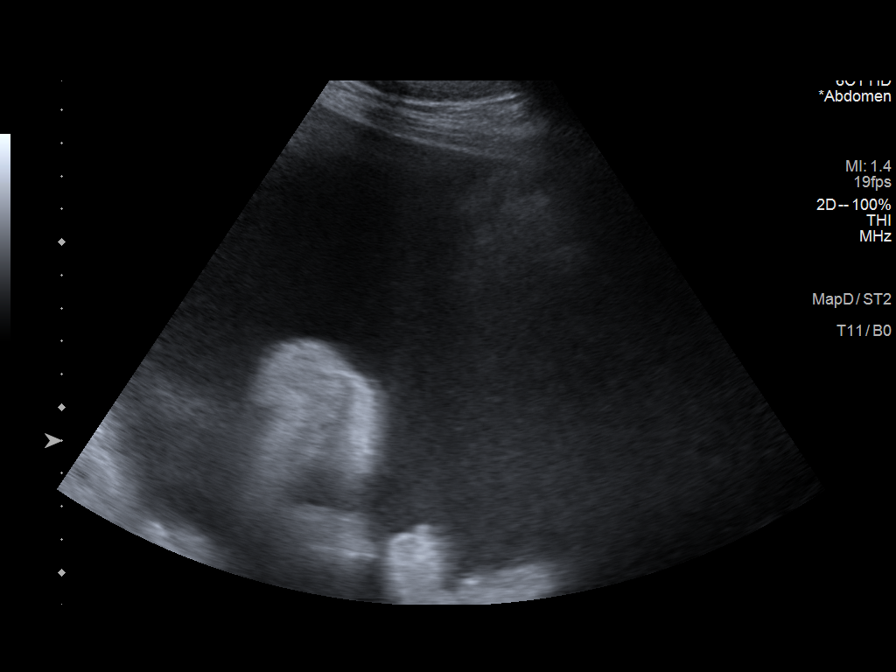
[im 3/8]
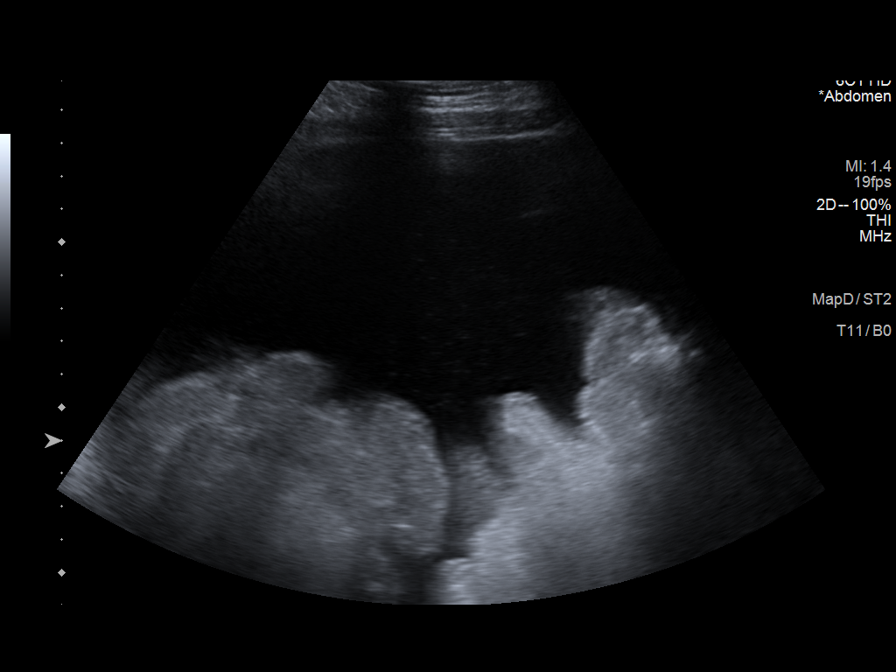
[im 4/8]
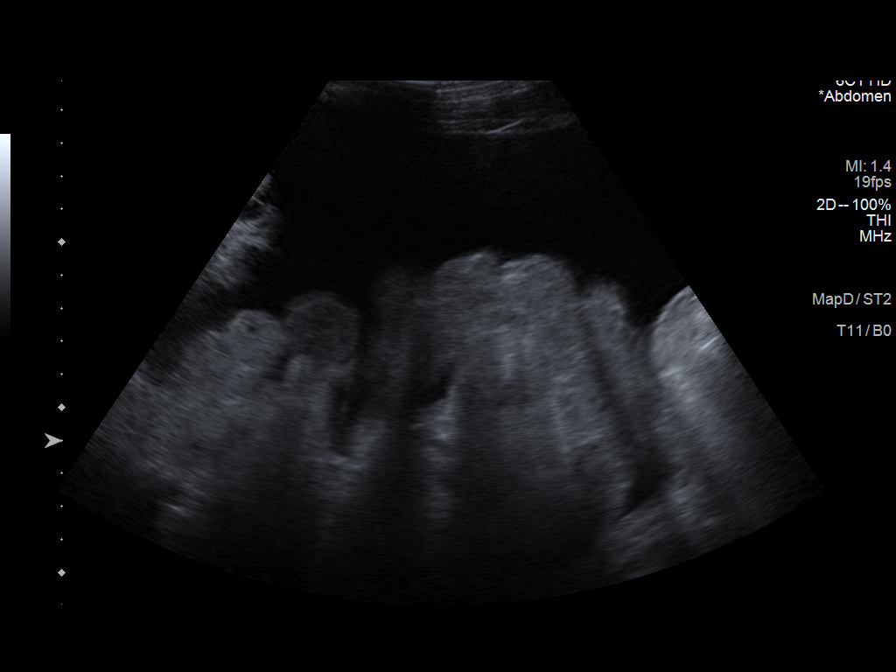
[im 5/8]
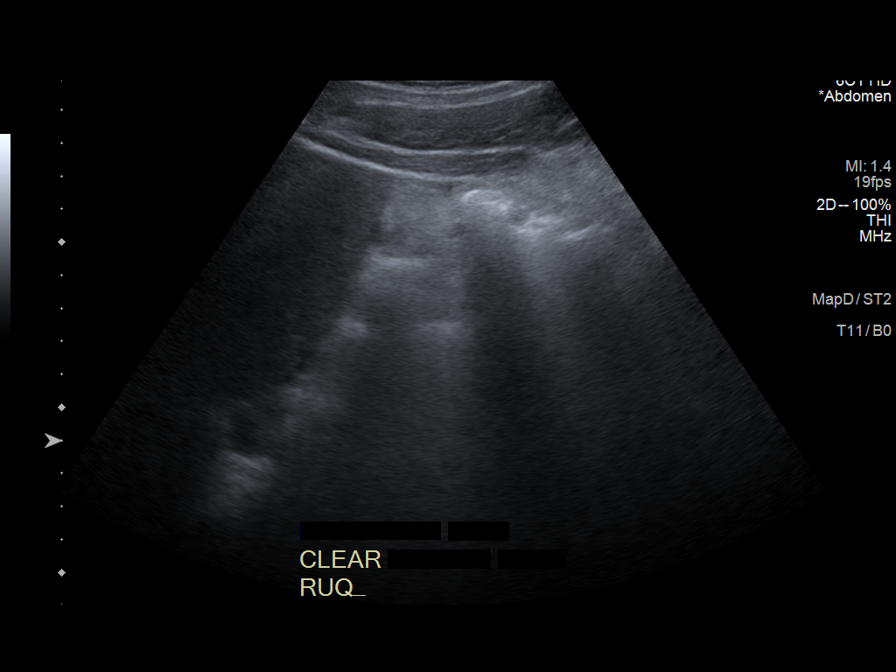
[im 6/8]
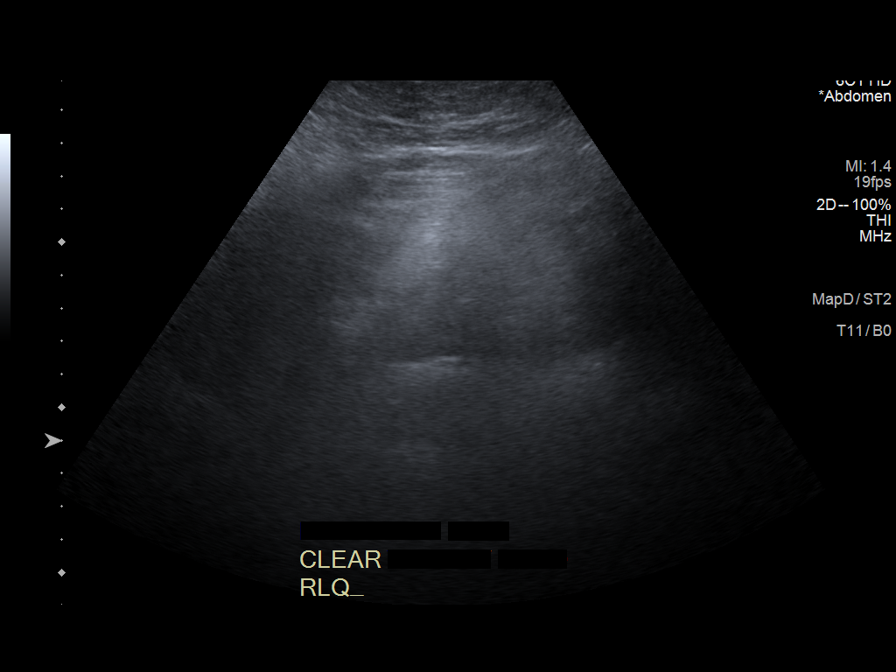
[im 7/8]
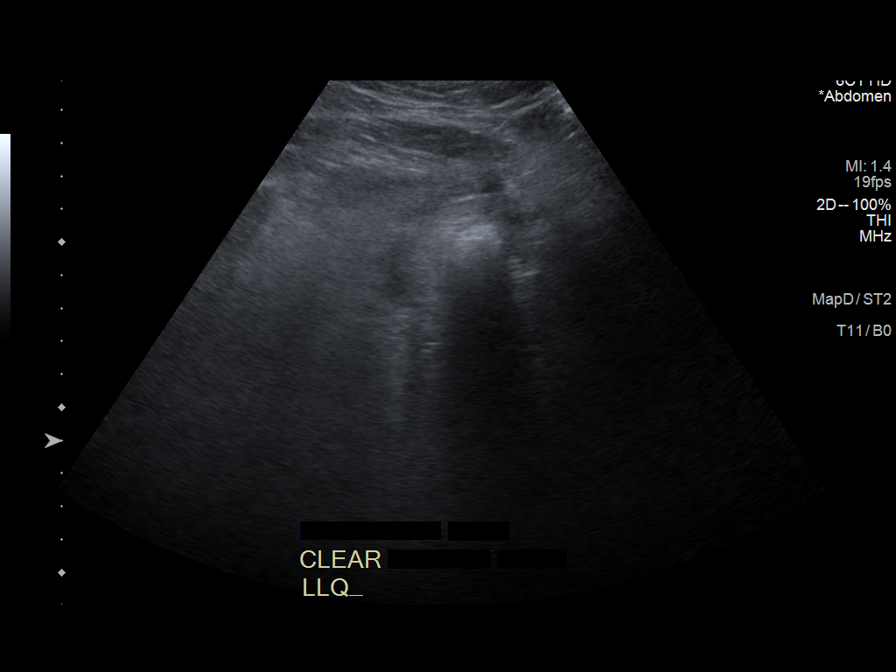
[im 8/8]
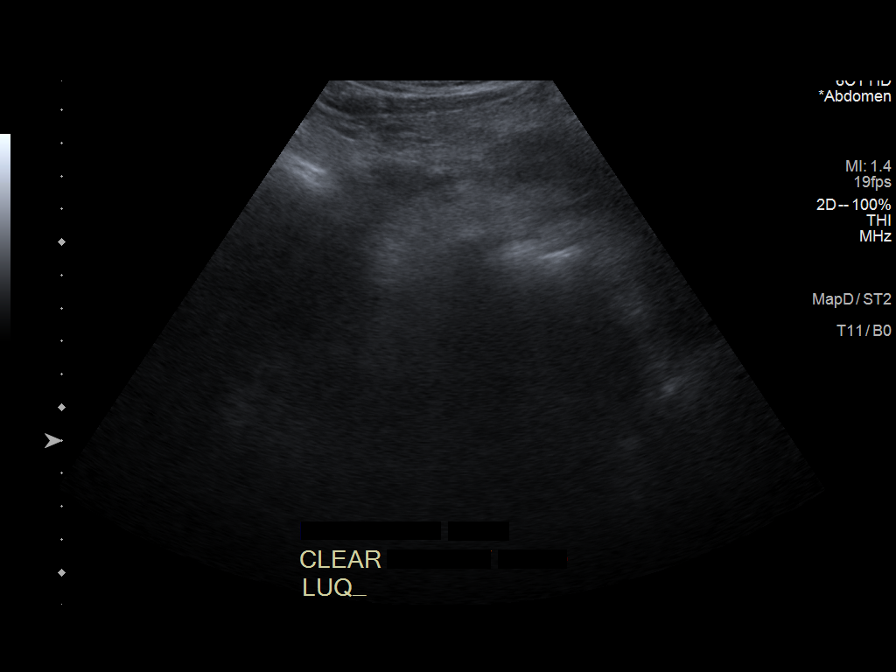

[8 of 8 positions shown; findings below may reference images not displayed]

EXAM:
ULTRASOUND GUIDED  PARACENTESIS

MEDICATIONS:
None.

COMPLICATIONS:
None immediate.

PROCEDURE:
Informed written consent was obtained from the patient after a
discussion of the risks, benefits and alternatives to treatment. A
timeout was performed prior to the initiation of the procedure.

Initial ultrasound scanning demonstrates a large amount of ascites
within the right lower abdominal quadrant. The right lower abdomen
was prepped and draped in the usual sterile fashion. 1% lidocaine
was used for local anesthesia.

Following this, a 6 French catheter was introduced. An ultrasound
image was saved for documentation purposes. The paracentesis was
performed. The catheter was removed and a dressing was applied. The
patient tolerated the procedure well without immediate post
procedural complication.
Patient received post-procedure intravenous albumin; see nursing
notes for details.
FINDINGS: A total of approximately 10.2 L of clear yellow fluid was removed.
IMPRESSION: Successful ultrasound-guided paracentesis yielding 10.2 liters of
peritoneal fluid.

## 2020-01-20 ENCOUNTER — Telehealth: Payer: Medicaid Other | Admitting: Nurse Practitioner

## 2020-01-22 NOTE — Telephone Encounter (Signed)
Copied from CRM [id number]. Topic: General - Deceased Patient >> Jan 06, 2020 10:32 AM Leafy Ro wrote: Reason for CRM: vanessa with [employer] in [place] is calling to report that patient passed away at 935 am on 01-06-20 Route to department's PEC Pool.

## 2020-01-22 NOTE — Telephone Encounter (Signed)
Spoke to [place] via telephone and stated appreciation for update and the care she provided to patient.

## 2020-01-22 DEATH — deceased

## 2020-05-11 IMAGING — XA IR IMAGE GUIDED DRAINAGE BY PERCUTANEOUS CATHETER
4 series · 4 of 4 positions shown · non-contrast
Comparison: Ultrasound-guided paracentesis-12/22/2019;

INDICATION: History of cirrhosis with recurrent symptomatic ascites, currently
with hospice and has a nonfunctional tunneled peritoneal drainage
catheter.

Patient now presents for placement of a new tunneled drainage
catheter and removal of the existing malfunctioning tunneled drain.
EXAM:
ULTRASOUND AND FLUOROSCOPIC GUIDED PLACEMENT OF TUNNELED PERITONEAL
CATHETER
TECHNIQUE: Informed written consent was obtained from the patient after a
discussion of the risks, benefits and alternatives to treatment.
Questions regarding the procedure were encouraged and answered. A
timeout was performed prior to the initiation of the procedure.

[Series 1: fluoroscopy - stored · 1 of 1 slices shown (1 of 4)]
[im 1/1]
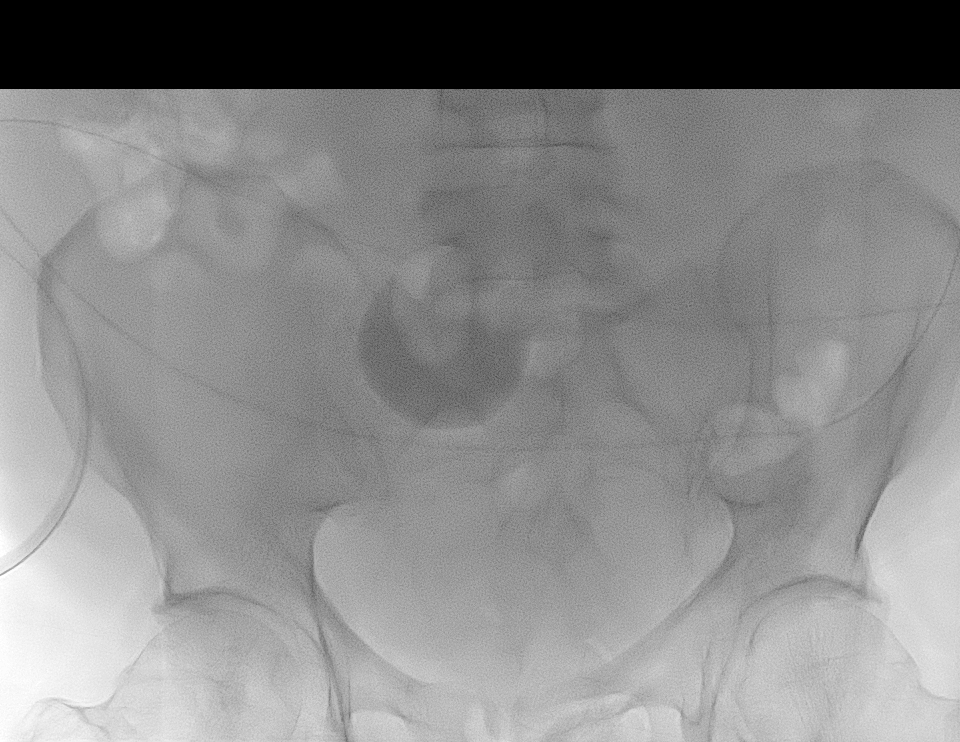

[Series 1: fluoroscopy - stored · 1 of 1 slices shown (2 of 4)]
[im 1/1]
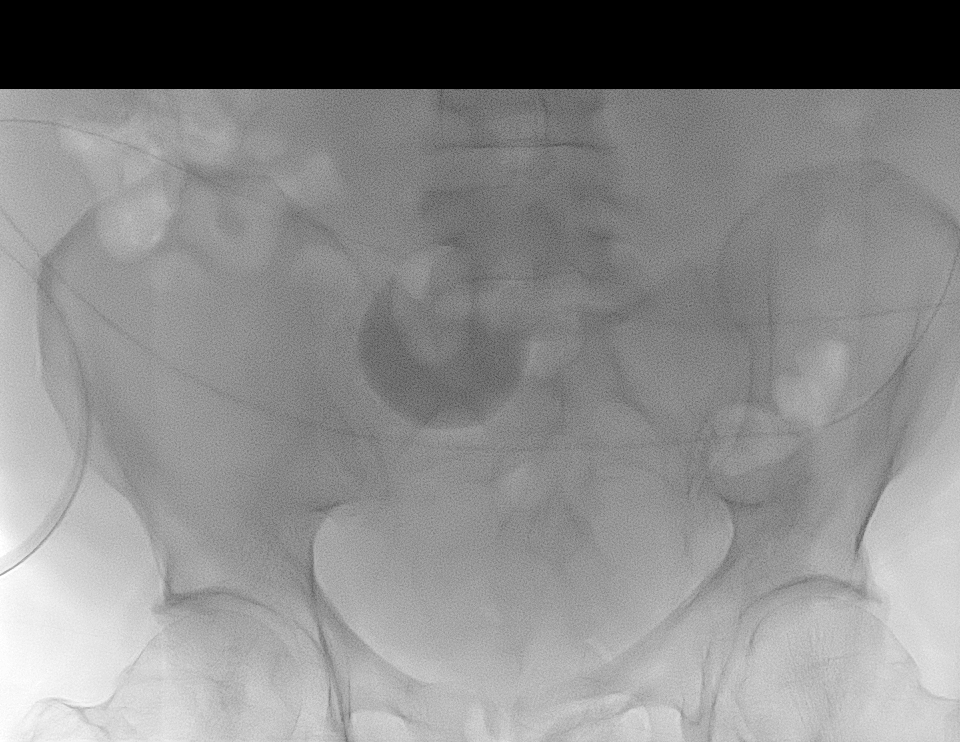

[Series 1: fluoroscopy - stored · 1 of 1 slices shown (3 of 4)]
[im 1/1]
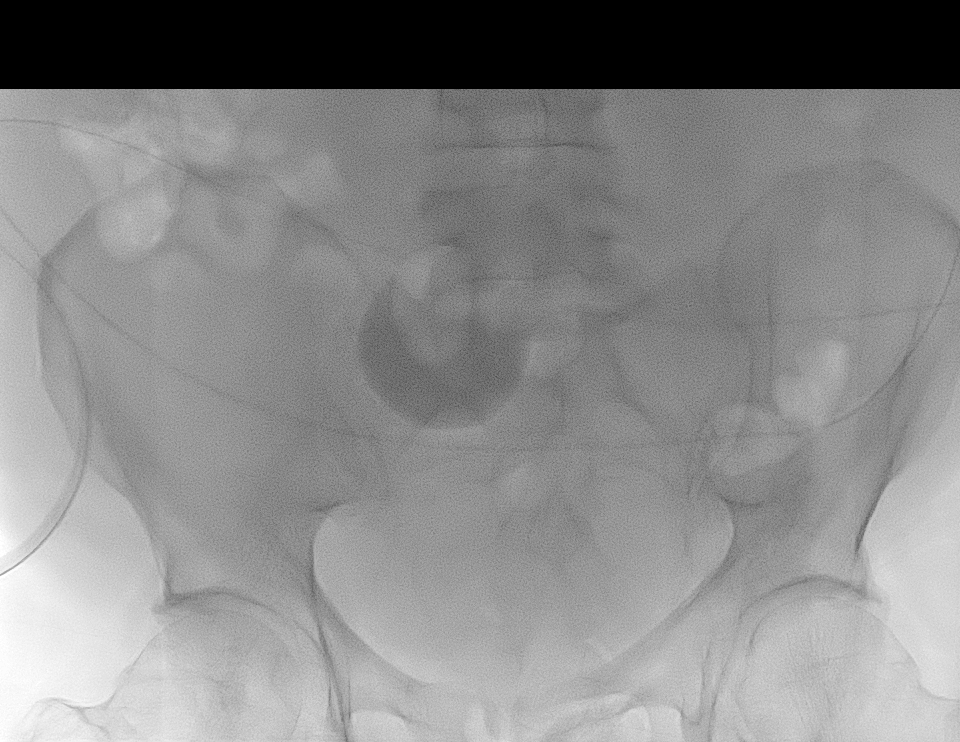

[Series 2: fluoroscopy - stored · 1 of 1 slices shown (4 of 4)]
[im 1/1]
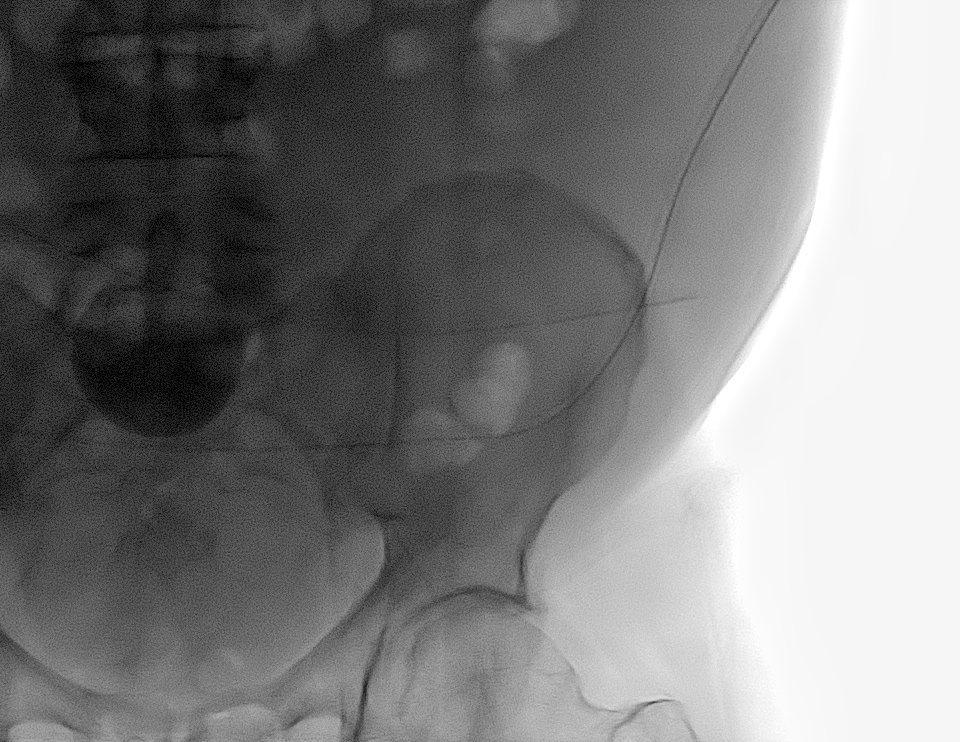

[4 of 4 positions shown; findings below may reference images not displayed]

image guided
tunneled peritoneal drainage catheter placement-11/17/2019

MEDICATIONS:
Ancef 2 gm IV; Antibiotics were administered within an appropriate
time frame prior to skin puncture.

CONTRAST:  None.

ANESTHESIA/SEDATION:
Moderate (conscious) sedation was employed during this procedure. A
total of Versed 1 mg and Fentanyl 100 mcg was administered
intravenously.

Moderate Sedation Time: 15 minutes. The patient's level of
consciousness and vital signs were monitored continuously by
radiology nursing throughout the procedure under my direct
supervision.

FLUOROSCOPY TIME:  36 seconds (5.3 mGy)

COMPLICATIONS:
None immediate.
Given the presence of the nonfunctional right lower abdominal/pelvic
tunneled drainage catheter decision was made to proceed with
placement of a new left-sided tunneled drainage catheter.

Ultrasound scanning of the left lower abdominal quadrant
demonstrates a moderate amount of complex fluid within the right
lower abdominal quadrant. The left lower abdomen was prepped and
draped usual sterile fashion a sterile drape was applied, covering
the operative table. Maximum barrier sterile technique with sterile
gowns and gloves were used for the procedure. A timeout was
performed prior to the initiation of the procedure. Local anesthesia
was provided with 1% lidocaine with epinephrine.

Under direct ultrasound guidance, an 18 gauge trocar needle was
advanced into the peritoneal space with the left abdominal quadrant.
Appropriate positioning was confirmed with the efflux of ascites. An
Amplatz super stiff wire was advanced across the abdomen under
intermittent fluoroscopic guidance. The access site was dilated over
the Amplatz wire, allowing advancement of a peel-away sheath.

The peritoneal catheter was tunneled in an antegrade fashion from a
site along the left mid clavicular line to the access site and
inserted through the peel-away sheath with tip ultimately
terminating within right lower abdomen/pelvis. Several
postprocedural spot abdominal radiographs were obtained.

The catheter was connected to wall suction and approximately 4.8 L
of serous ascites was aspirated.

The access site was closed with an interrupted 4-0 Vicryl suture,
Dermabond and Rinku.

Next, the nonfunctional right-sided tunneled peritoneal drainage
catheter was removed intact. The exit site was closed with Dermabond
and Rinku to decrease the chances of peritoneal leakage.

Dressings were placed. The patient tolerated procedure well without
immediate postprocedural complication.
FINDINGS: After successful ultrasound and fluoroscopic guided peritoneal
drainage catheter placement, the left lower quadrant abdominal
approach drainage catheter.

Following tunneled peritoneal drainage catheter placement,
approximately 4.8 L of serous fluid was aspirated.
IMPRESSION: 1. Successful ultrasound and fluoroscopic guided placement of a
tunneled peritoneal drainage catheter with aspiration of 4.8 L of
serous ascites following tunneled peritoneal drainage catheter
placement.
2. Successful bedside removal of nonfunctional right lower abdominal
tunneled peritoneal drainage catheter. A small amount of occlusive
fibrinous material was noted within the catheter tubing, likely the
etiology of catheter's malfunction.
